# Patient Record
Sex: Male | Born: 1945
Health system: Southern US, Community
[De-identification: ages and names within clinical notes are randomized; demographics above are authoritative.]

## PROBLEM LIST (undated history)

## (undated) DIAGNOSIS — G4700 Insomnia, unspecified: Secondary | ICD-10-CM

## (undated) DIAGNOSIS — K5900 Constipation, unspecified: Secondary | ICD-10-CM

## (undated) DIAGNOSIS — G8929 Other chronic pain: Secondary | ICD-10-CM

## (undated) DIAGNOSIS — K649 Unspecified hemorrhoids: Secondary | ICD-10-CM

## (undated) DIAGNOSIS — N200 Calculus of kidney: Secondary | ICD-10-CM

## (undated) DIAGNOSIS — N471 Phimosis: Secondary | ICD-10-CM

## (undated) DIAGNOSIS — M549 Dorsalgia, unspecified: Secondary | ICD-10-CM

## (undated) DIAGNOSIS — H9190 Unspecified hearing loss, unspecified ear: Secondary | ICD-10-CM

## (undated) DIAGNOSIS — N281 Cyst of kidney, acquired: Secondary | ICD-10-CM

## (undated) DIAGNOSIS — E785 Hyperlipidemia, unspecified: Secondary | ICD-10-CM

## (undated) DIAGNOSIS — L219 Seborrheic dermatitis, unspecified: Secondary | ICD-10-CM

## (undated) DIAGNOSIS — D649 Anemia, unspecified: Secondary | ICD-10-CM

## (undated) DIAGNOSIS — K219 Gastro-esophageal reflux disease without esophagitis: Secondary | ICD-10-CM

## (undated) DIAGNOSIS — M810 Age-related osteoporosis without current pathological fracture: Secondary | ICD-10-CM

## (undated) DIAGNOSIS — N400 Enlarged prostate without lower urinary tract symptoms: Secondary | ICD-10-CM

## (undated) HISTORY — DX: Gastro-esophageal reflux disease without esophagitis: K21.9

## (undated) HISTORY — DX: Unspecified hemorrhoids: K64.9

## (undated) HISTORY — DX: Constipation, unspecified: K59.00

## (undated) HISTORY — DX: Unspecified hearing loss, unspecified ear: H91.90

## (undated) HISTORY — DX: Dorsalgia, unspecified: M54.9

## (undated) HISTORY — PX: OTHER SURGICAL HISTORY: SHX169

## (undated) HISTORY — DX: Benign prostatic hyperplasia without lower urinary tract symptoms: N40.0

## (undated) HISTORY — DX: Cyst of kidney, acquired: N28.1

## (undated) HISTORY — DX: Insomnia, unspecified: G47.00

## (undated) HISTORY — DX: Calculus of kidney: N20.0

## (undated) HISTORY — DX: Phimosis: N47.1

## (undated) HISTORY — DX: Hyperlipidemia, unspecified: E78.5

## (undated) HISTORY — DX: Age-related osteoporosis without current pathological fracture: M81.0

## (undated) HISTORY — DX: Anemia, unspecified: D64.9

## (undated) HISTORY — DX: Other chronic pain: G89.29

## (undated) HISTORY — DX: Seborrheic dermatitis, unspecified: L21.9

---

## 2004-09-05 ENCOUNTER — Ambulatory Visit: Payer: Self-pay | Admitting: Family Medicine

## 2005-03-10 ENCOUNTER — Emergency Department: Payer: Self-pay | Admitting: Emergency Medicine

## 2005-03-10 ENCOUNTER — Other Ambulatory Visit: Payer: Self-pay

## 2006-03-12 ENCOUNTER — Ambulatory Visit: Payer: Self-pay | Admitting: Family Medicine

## 2006-04-18 ENCOUNTER — Ambulatory Visit: Payer: Self-pay | Admitting: Gastroenterology

## 2006-05-10 ENCOUNTER — Emergency Department: Payer: Self-pay | Admitting: Emergency Medicine

## 2007-12-24 ENCOUNTER — Ambulatory Visit: Payer: Self-pay | Admitting: Family Medicine

## 2008-01-16 ENCOUNTER — Ambulatory Visit: Payer: Self-pay | Admitting: Family Medicine

## 2008-03-04 ENCOUNTER — Ambulatory Visit: Payer: Self-pay | Admitting: Anesthesiology

## 2008-03-24 ENCOUNTER — Ambulatory Visit: Payer: Self-pay | Admitting: Anesthesiology

## 2008-04-06 ENCOUNTER — Ambulatory Visit: Payer: Self-pay | Admitting: Anesthesiology

## 2008-05-26 ENCOUNTER — Ambulatory Visit: Payer: Self-pay | Admitting: Anesthesiology

## 2008-07-09 ENCOUNTER — Ambulatory Visit: Payer: Self-pay | Admitting: Anesthesiology

## 2008-09-15 ENCOUNTER — Ambulatory Visit: Payer: Self-pay | Admitting: Anesthesiology

## 2008-10-25 ENCOUNTER — Ambulatory Visit: Payer: Self-pay | Admitting: Anesthesiology

## 2008-11-30 ENCOUNTER — Encounter: Payer: Self-pay | Admitting: Anesthesiology

## 2008-12-21 ENCOUNTER — Ambulatory Visit: Payer: Self-pay | Admitting: Anesthesiology

## 2009-02-10 ENCOUNTER — Emergency Department: Payer: Self-pay | Admitting: Emergency Medicine

## 2009-05-03 ENCOUNTER — Ambulatory Visit: Payer: Self-pay | Admitting: Anesthesiology

## 2009-06-10 ENCOUNTER — Emergency Department: Payer: Self-pay | Admitting: Emergency Medicine

## 2009-10-05 ENCOUNTER — Emergency Department: Payer: Self-pay | Admitting: Emergency Medicine

## 2010-11-26 ENCOUNTER — Emergency Department: Payer: Self-pay | Admitting: Emergency Medicine

## 2011-07-03 ENCOUNTER — Emergency Department: Payer: Self-pay | Admitting: Emergency Medicine

## 2011-11-09 DIAGNOSIS — H6120 Impacted cerumen, unspecified ear: Secondary | ICD-10-CM | POA: Diagnosis not present

## 2012-01-02 ENCOUNTER — Emergency Department: Payer: Self-pay | Admitting: Emergency Medicine

## 2012-01-02 DIAGNOSIS — R031 Nonspecific low blood-pressure reading: Secondary | ICD-10-CM | POA: Diagnosis not present

## 2012-01-02 DIAGNOSIS — G20 Parkinson's disease: Secondary | ICD-10-CM | POA: Diagnosis not present

## 2012-01-02 DIAGNOSIS — G20A1 Parkinson's disease without dyskinesia, without mention of fluctuations: Secondary | ICD-10-CM | POA: Diagnosis not present

## 2012-01-02 DIAGNOSIS — Z0000 Encounter for general adult medical examination without abnormal findings: Secondary | ICD-10-CM | POA: Diagnosis not present

## 2012-01-02 DIAGNOSIS — Z79899 Other long term (current) drug therapy: Secondary | ICD-10-CM | POA: Diagnosis not present

## 2012-01-02 DIAGNOSIS — K219 Gastro-esophageal reflux disease without esophagitis: Secondary | ICD-10-CM | POA: Diagnosis not present

## 2012-01-02 DIAGNOSIS — F411 Generalized anxiety disorder: Secondary | ICD-10-CM | POA: Diagnosis not present

## 2012-01-02 DIAGNOSIS — Z043 Encounter for examination and observation following other accident: Secondary | ICD-10-CM | POA: Diagnosis not present

## 2012-01-02 DIAGNOSIS — R569 Unspecified convulsions: Secondary | ICD-10-CM | POA: Diagnosis not present

## 2012-01-02 DIAGNOSIS — R6889 Other general symptoms and signs: Secondary | ICD-10-CM | POA: Diagnosis not present

## 2012-01-09 DIAGNOSIS — K219 Gastro-esophageal reflux disease without esophagitis: Secondary | ICD-10-CM | POA: Diagnosis not present

## 2012-01-09 DIAGNOSIS — K5900 Constipation, unspecified: Secondary | ICD-10-CM | POA: Diagnosis not present

## 2012-01-09 DIAGNOSIS — M549 Dorsalgia, unspecified: Secondary | ICD-10-CM | POA: Diagnosis not present

## 2012-01-09 DIAGNOSIS — E785 Hyperlipidemia, unspecified: Secondary | ICD-10-CM | POA: Diagnosis not present

## 2012-02-08 DIAGNOSIS — Z79899 Other long term (current) drug therapy: Secondary | ICD-10-CM | POA: Diagnosis not present

## 2012-02-08 DIAGNOSIS — Z862 Personal history of diseases of the blood and blood-forming organs and certain disorders involving the immune mechanism: Secondary | ICD-10-CM | POA: Diagnosis not present

## 2012-02-08 DIAGNOSIS — E785 Hyperlipidemia, unspecified: Secondary | ICD-10-CM | POA: Diagnosis not present

## 2012-04-07 DIAGNOSIS — Z0000 Encounter for general adult medical examination without abnormal findings: Secondary | ICD-10-CM | POA: Diagnosis not present

## 2012-04-07 DIAGNOSIS — Z7189 Other specified counseling: Secondary | ICD-10-CM | POA: Diagnosis not present

## 2012-04-07 DIAGNOSIS — Z1211 Encounter for screening for malignant neoplasm of colon: Secondary | ICD-10-CM | POA: Diagnosis not present

## 2012-04-07 DIAGNOSIS — Z125 Encounter for screening for malignant neoplasm of prostate: Secondary | ICD-10-CM | POA: Diagnosis not present

## 2012-04-15 DIAGNOSIS — H903 Sensorineural hearing loss, bilateral: Secondary | ICD-10-CM | POA: Diagnosis not present

## 2012-04-15 DIAGNOSIS — H6120 Impacted cerumen, unspecified ear: Secondary | ICD-10-CM | POA: Diagnosis not present

## 2012-04-15 DIAGNOSIS — H60509 Unspecified acute noninfective otitis externa, unspecified ear: Secondary | ICD-10-CM | POA: Diagnosis not present

## 2012-05-27 DIAGNOSIS — M549 Dorsalgia, unspecified: Secondary | ICD-10-CM | POA: Diagnosis not present

## 2012-05-27 DIAGNOSIS — K625 Hemorrhage of anus and rectum: Secondary | ICD-10-CM | POA: Diagnosis not present

## 2012-07-14 DIAGNOSIS — Z593 Problems related to living in residential institution: Secondary | ICD-10-CM | POA: Diagnosis not present

## 2012-07-14 DIAGNOSIS — E785 Hyperlipidemia, unspecified: Secondary | ICD-10-CM | POA: Diagnosis not present

## 2012-07-14 DIAGNOSIS — Z789 Other specified health status: Secondary | ICD-10-CM | POA: Diagnosis not present

## 2012-07-14 DIAGNOSIS — K219 Gastro-esophageal reflux disease without esophagitis: Secondary | ICD-10-CM | POA: Diagnosis not present

## 2012-07-14 DIAGNOSIS — M549 Dorsalgia, unspecified: Secondary | ICD-10-CM | POA: Diagnosis not present

## 2012-09-08 DIAGNOSIS — F70 Mild intellectual disabilities: Secondary | ICD-10-CM | POA: Diagnosis not present

## 2012-09-16 DIAGNOSIS — M549 Dorsalgia, unspecified: Secondary | ICD-10-CM | POA: Diagnosis not present

## 2012-09-16 DIAGNOSIS — Z9181 History of falling: Secondary | ICD-10-CM | POA: Diagnosis not present

## 2012-09-16 DIAGNOSIS — F919 Conduct disorder, unspecified: Secondary | ICD-10-CM | POA: Diagnosis not present

## 2012-09-16 DIAGNOSIS — Z789 Other specified health status: Secondary | ICD-10-CM | POA: Diagnosis not present

## 2012-09-16 DIAGNOSIS — Z593 Problems related to living in residential institution: Secondary | ICD-10-CM | POA: Diagnosis not present

## 2012-09-17 DIAGNOSIS — Z23 Encounter for immunization: Secondary | ICD-10-CM | POA: Diagnosis not present

## 2012-10-21 DIAGNOSIS — H903 Sensorineural hearing loss, bilateral: Secondary | ICD-10-CM | POA: Diagnosis not present

## 2012-10-21 DIAGNOSIS — H6120 Impacted cerumen, unspecified ear: Secondary | ICD-10-CM | POA: Diagnosis not present

## 2012-11-17 DIAGNOSIS — K219 Gastro-esophageal reflux disease without esophagitis: Secondary | ICD-10-CM | POA: Diagnosis not present

## 2012-11-17 DIAGNOSIS — G4700 Insomnia, unspecified: Secondary | ICD-10-CM | POA: Diagnosis not present

## 2012-11-17 DIAGNOSIS — E785 Hyperlipidemia, unspecified: Secondary | ICD-10-CM | POA: Diagnosis not present

## 2012-11-17 DIAGNOSIS — M549 Dorsalgia, unspecified: Secondary | ICD-10-CM | POA: Diagnosis not present

## 2013-02-18 DIAGNOSIS — F639 Impulse disorder, unspecified: Secondary | ICD-10-CM | POA: Diagnosis not present

## 2013-02-18 DIAGNOSIS — F411 Generalized anxiety disorder: Secondary | ICD-10-CM | POA: Diagnosis not present

## 2013-02-26 DIAGNOSIS — H04129 Dry eye syndrome of unspecified lacrimal gland: Secondary | ICD-10-CM | POA: Diagnosis not present

## 2013-02-26 DIAGNOSIS — H2510 Age-related nuclear cataract, unspecified eye: Secondary | ICD-10-CM | POA: Diagnosis not present

## 2013-03-10 DIAGNOSIS — F411 Generalized anxiety disorder: Secondary | ICD-10-CM | POA: Diagnosis not present

## 2013-03-19 DIAGNOSIS — Z9889 Other specified postprocedural states: Secondary | ICD-10-CM | POA: Diagnosis not present

## 2013-03-19 DIAGNOSIS — H6120 Impacted cerumen, unspecified ear: Secondary | ICD-10-CM | POA: Diagnosis not present

## 2013-03-19 DIAGNOSIS — M810 Age-related osteoporosis without current pathological fracture: Secondary | ICD-10-CM | POA: Diagnosis not present

## 2013-03-19 DIAGNOSIS — M549 Dorsalgia, unspecified: Secondary | ICD-10-CM | POA: Diagnosis not present

## 2013-03-24 DIAGNOSIS — F411 Generalized anxiety disorder: Secondary | ICD-10-CM | POA: Diagnosis not present

## 2013-04-09 DIAGNOSIS — Z1331 Encounter for screening for depression: Secondary | ICD-10-CM | POA: Diagnosis not present

## 2013-04-09 DIAGNOSIS — Z1159 Encounter for screening for other viral diseases: Secondary | ICD-10-CM | POA: Diagnosis not present

## 2013-04-09 DIAGNOSIS — Z125 Encounter for screening for malignant neoplasm of prostate: Secondary | ICD-10-CM | POA: Diagnosis not present

## 2013-04-09 DIAGNOSIS — Z0000 Encounter for general adult medical examination without abnormal findings: Secondary | ICD-10-CM | POA: Diagnosis not present

## 2013-04-09 DIAGNOSIS — Z1211 Encounter for screening for malignant neoplasm of colon: Secondary | ICD-10-CM | POA: Diagnosis not present

## 2013-04-21 DIAGNOSIS — H903 Sensorineural hearing loss, bilateral: Secondary | ICD-10-CM | POA: Diagnosis not present

## 2013-04-21 DIAGNOSIS — H6120 Impacted cerumen, unspecified ear: Secondary | ICD-10-CM | POA: Diagnosis not present

## 2013-05-11 DIAGNOSIS — F79 Unspecified intellectual disabilities: Secondary | ICD-10-CM | POA: Diagnosis not present

## 2013-05-11 DIAGNOSIS — K219 Gastro-esophageal reflux disease without esophagitis: Secondary | ICD-10-CM | POA: Diagnosis not present

## 2013-05-14 DIAGNOSIS — G8929 Other chronic pain: Secondary | ICD-10-CM | POA: Diagnosis not present

## 2013-05-14 DIAGNOSIS — E7800 Pure hypercholesterolemia, unspecified: Secondary | ICD-10-CM | POA: Diagnosis not present

## 2013-05-14 DIAGNOSIS — Z79899 Other long term (current) drug therapy: Secondary | ICD-10-CM | POA: Diagnosis not present

## 2013-05-14 DIAGNOSIS — K029 Dental caries, unspecified: Secondary | ICD-10-CM | POA: Diagnosis not present

## 2013-05-14 DIAGNOSIS — F79 Unspecified intellectual disabilities: Secondary | ICD-10-CM | POA: Diagnosis not present

## 2013-05-14 DIAGNOSIS — K219 Gastro-esophageal reflux disease without esophagitis: Secondary | ICD-10-CM | POA: Diagnosis not present

## 2013-05-14 DIAGNOSIS — Z7982 Long term (current) use of aspirin: Secondary | ICD-10-CM | POA: Diagnosis not present

## 2013-06-04 DIAGNOSIS — H903 Sensorineural hearing loss, bilateral: Secondary | ICD-10-CM | POA: Diagnosis not present

## 2013-06-04 DIAGNOSIS — H6120 Impacted cerumen, unspecified ear: Secondary | ICD-10-CM | POA: Diagnosis not present

## 2013-06-04 DIAGNOSIS — H60509 Unspecified acute noninfective otitis externa, unspecified ear: Secondary | ICD-10-CM | POA: Diagnosis not present

## 2013-06-05 DIAGNOSIS — Z79899 Other long term (current) drug therapy: Secondary | ICD-10-CM | POA: Diagnosis not present

## 2013-06-05 DIAGNOSIS — E785 Hyperlipidemia, unspecified: Secondary | ICD-10-CM | POA: Diagnosis not present

## 2013-06-05 DIAGNOSIS — Z125 Encounter for screening for malignant neoplasm of prostate: Secondary | ICD-10-CM | POA: Diagnosis not present

## 2013-06-05 DIAGNOSIS — Z1159 Encounter for screening for other viral diseases: Secondary | ICD-10-CM | POA: Diagnosis not present

## 2013-06-09 DIAGNOSIS — F411 Generalized anxiety disorder: Secondary | ICD-10-CM | POA: Diagnosis not present

## 2013-06-16 DIAGNOSIS — K140 Glossitis: Secondary | ICD-10-CM | POA: Diagnosis not present

## 2013-06-20 ENCOUNTER — Emergency Department: Payer: Self-pay | Admitting: Unknown Physician Specialty

## 2013-06-20 DIAGNOSIS — F0280 Dementia in other diseases classified elsewhere without behavioral disturbance: Secondary | ICD-10-CM | POA: Diagnosis not present

## 2013-06-20 DIAGNOSIS — Z79899 Other long term (current) drug therapy: Secondary | ICD-10-CM | POA: Diagnosis not present

## 2013-06-20 DIAGNOSIS — N390 Urinary tract infection, site not specified: Secondary | ICD-10-CM | POA: Diagnosis not present

## 2013-06-20 DIAGNOSIS — M1990 Unspecified osteoarthritis, unspecified site: Secondary | ICD-10-CM | POA: Diagnosis not present

## 2013-06-20 DIAGNOSIS — R079 Chest pain, unspecified: Secondary | ICD-10-CM | POA: Diagnosis not present

## 2013-06-20 DIAGNOSIS — R0789 Other chest pain: Secondary | ICD-10-CM | POA: Diagnosis not present

## 2013-06-20 LAB — URINALYSIS, COMPLETE
Glucose,UR: NEGATIVE mg/dL (ref 0–75)
Leukocyte Esterase: NEGATIVE
Nitrite: NEGATIVE
Ph: 5 (ref 4.5–8.0)
Protein: >=500
RBC,UR: 107 /HPF (ref 0–5)
Specific Gravity: 1.039 (ref 1.003–1.030)
Squamous Epithelial: 1
WBC UR: 2 /[HPF] (ref 0–5)

## 2013-06-20 LAB — BASIC METABOLIC PANEL
Anion Gap: 6 — ABNORMAL LOW (ref 7–16)
BUN: 28 mg/dL — ABNORMAL HIGH (ref 7–18)
Calcium, Total: 9 mg/dL (ref 8.5–10.1)
Chloride: 103 mmol/L (ref 98–107)
Co2: 27 mmol/L (ref 21–32)
Creatinine: 1.23 mg/dL (ref 0.60–1.30)
EGFR (African American): >60
EGFR (Non-African Amer.): >60
Glucose: 106 mg/dL — ABNORMAL HIGH (ref 65–99)
Osmolality: 278 (ref 275–301)
Potassium: 3.7 mmol/L (ref 3.5–5.1)
Sodium: 136 mmol/L (ref 136–145)

## 2013-06-20 LAB — HEPATIC FUNCTION PANEL A (ARMC)
Albumin: 3.9 g/dL (ref 3.4–5.0)
Alkaline Phosphatase: 85 U/L (ref 50–136)
Bilirubin, Direct: 0.1 mg/dL (ref 0.00–0.20)
Bilirubin,Total: 0.5 mg/dL (ref 0.2–1.0)
SGOT(AST): 38 U/L — ABNORMAL HIGH (ref 15–37)
SGPT (ALT): 32 U/L (ref 12–78)
Total Protein: 8 g/dL (ref 6.4–8.2)

## 2013-06-20 LAB — CBC
HCT: 37 % — ABNORMAL LOW (ref 40.0–52.0)
HGB: 12.6 g/dL — ABNORMAL LOW (ref 13.0–18.0)
MCH: 31.5 pg (ref 26.0–34.0)
MCHC: 34.2 g/dL (ref 32.0–36.0)
MCV: 92 fL (ref 80–100)
Platelet: 409 10*3/uL (ref 150–440)
RBC: 4.01 10*6/uL — ABNORMAL LOW (ref 4.40–5.90)
RDW: 12.6 % (ref 11.5–14.5)
WBC: 13.3 10*3/uL — ABNORMAL HIGH (ref 3.8–10.6)

## 2013-06-20 LAB — TROPONIN I: Troponin-I: <0.02 ng/mL

## 2013-06-22 LAB — URINE CULTURE

## 2013-06-23 ENCOUNTER — Emergency Department: Payer: Self-pay | Admitting: Emergency Medicine

## 2013-06-23 DIAGNOSIS — R079 Chest pain, unspecified: Secondary | ICD-10-CM | POA: Diagnosis not present

## 2013-06-23 DIAGNOSIS — N281 Cyst of kidney, acquired: Secondary | ICD-10-CM | POA: Diagnosis not present

## 2013-06-23 DIAGNOSIS — R569 Unspecified convulsions: Secondary | ICD-10-CM | POA: Diagnosis not present

## 2013-06-23 DIAGNOSIS — J209 Acute bronchitis, unspecified: Secondary | ICD-10-CM | POA: Diagnosis not present

## 2013-06-23 DIAGNOSIS — J40 Bronchitis, not specified as acute or chronic: Secondary | ICD-10-CM | POA: Diagnosis not present

## 2013-06-23 DIAGNOSIS — N200 Calculus of kidney: Secondary | ICD-10-CM | POA: Diagnosis not present

## 2013-06-23 DIAGNOSIS — Z79899 Other long term (current) drug therapy: Secondary | ICD-10-CM | POA: Diagnosis not present

## 2013-06-23 DIAGNOSIS — R0789 Other chest pain: Secondary | ICD-10-CM | POA: Diagnosis not present

## 2013-06-23 LAB — COMPREHENSIVE METABOLIC PANEL
Albumin: 3.4 g/dL (ref 3.4–5.0)
Alkaline Phosphatase: 70 U/L (ref 50–136)
Anion Gap: 6 — ABNORMAL LOW (ref 7–16)
BUN: 20 mg/dL — ABNORMAL HIGH (ref 7–18)
Bilirubin,Total: 0.4 mg/dL (ref 0.2–1.0)
Calcium, Total: 8.8 mg/dL (ref 8.5–10.1)
Chloride: 101 mmol/L (ref 98–107)
Co2: 27 mmol/L (ref 21–32)
Creatinine: 0.94 mg/dL (ref 0.60–1.30)
EGFR (African American): >60
EGFR (Non-African Amer.): >60
Glucose: 83 mg/dL (ref 65–99)
Osmolality: 270 (ref 275–301)
Potassium: 3.7 mmol/L (ref 3.5–5.1)
SGOT(AST): 26 U/L (ref 15–37)
SGPT (ALT): 29 U/L (ref 12–78)
Sodium: 134 mmol/L — ABNORMAL LOW (ref 136–145)
Total Protein: 7 g/dL (ref 6.4–8.2)

## 2013-06-23 LAB — CBC WITH DIFFERENTIAL/PLATELET
Basophil #: 0.1 10*3/uL (ref 0.0–0.1)
Basophil %: 1 %
Eosinophil #: 0.2 10*3/uL (ref 0.0–0.7)
Eosinophil %: 2 %
HCT: 34.8 % — ABNORMAL LOW (ref 40.0–52.0)
HGB: 12.1 g/dL — ABNORMAL LOW (ref 13.0–18.0)
Lymphocyte %: 24.2 %
Lymphs Abs: 2.1 10*3/uL (ref 1.0–3.6)
MCH: 31.9 pg (ref 26.0–34.0)
MCHC: 34.6 g/dL (ref 32.0–36.0)
MCV: 92 fL (ref 80–100)
Monocyte #: 0.7 x10 3/mm (ref 0.2–1.0)
Monocyte %: 8.6 %
Neutrophil #: 5.6 10*3/uL (ref 1.4–6.5)
Neutrophil %: 64.2 %
Platelet: 395 10*3/uL (ref 150–440)
RBC: 3.78 10*6/uL — ABNORMAL LOW (ref 4.40–5.90)
RDW: 12.7 % (ref 11.5–14.5)
WBC: 8.7 10*3/uL (ref 3.8–10.6)

## 2013-06-23 LAB — TROPONIN I: Troponin-I: <0.02 ng/mL

## 2013-06-25 LAB — CULTURE, BLOOD (SINGLE)

## 2013-07-01 DIAGNOSIS — D649 Anemia, unspecified: Secondary | ICD-10-CM | POA: Diagnosis not present

## 2013-07-01 DIAGNOSIS — J40 Bronchitis, not specified as acute or chronic: Secondary | ICD-10-CM | POA: Diagnosis not present

## 2013-07-01 DIAGNOSIS — J029 Acute pharyngitis, unspecified: Secondary | ICD-10-CM | POA: Diagnosis not present

## 2013-07-01 DIAGNOSIS — R1310 Dysphagia, unspecified: Secondary | ICD-10-CM | POA: Diagnosis not present

## 2013-07-02 DIAGNOSIS — K219 Gastro-esophageal reflux disease without esophagitis: Secondary | ICD-10-CM | POA: Diagnosis not present

## 2013-07-02 DIAGNOSIS — R1310 Dysphagia, unspecified: Secondary | ICD-10-CM | POA: Diagnosis not present

## 2013-07-02 DIAGNOSIS — R070 Pain in throat: Secondary | ICD-10-CM | POA: Diagnosis not present

## 2013-07-09 ENCOUNTER — Ambulatory Visit: Payer: Self-pay | Admitting: Otolaryngology

## 2013-07-09 DIAGNOSIS — K219 Gastro-esophageal reflux disease without esophagitis: Secondary | ICD-10-CM | POA: Diagnosis not present

## 2013-07-09 DIAGNOSIS — R1310 Dysphagia, unspecified: Secondary | ICD-10-CM | POA: Diagnosis not present

## 2013-07-09 DIAGNOSIS — K2289 Other specified disease of esophagus: Secondary | ICD-10-CM | POA: Diagnosis not present

## 2013-07-09 DIAGNOSIS — R070 Pain in throat: Secondary | ICD-10-CM | POA: Diagnosis not present

## 2013-07-31 DIAGNOSIS — R634 Abnormal weight loss: Secondary | ICD-10-CM | POA: Diagnosis not present

## 2013-07-31 DIAGNOSIS — R1310 Dysphagia, unspecified: Secondary | ICD-10-CM | POA: Diagnosis not present

## 2013-08-05 DIAGNOSIS — R51 Headache: Secondary | ICD-10-CM | POA: Diagnosis not present

## 2013-08-10 DIAGNOSIS — R1310 Dysphagia, unspecified: Secondary | ICD-10-CM | POA: Diagnosis not present

## 2013-08-10 DIAGNOSIS — K1120 Sialoadenitis, unspecified: Secondary | ICD-10-CM | POA: Diagnosis not present

## 2013-08-10 DIAGNOSIS — G8929 Other chronic pain: Secondary | ICD-10-CM | POA: Diagnosis not present

## 2013-08-10 DIAGNOSIS — M549 Dorsalgia, unspecified: Secondary | ICD-10-CM | POA: Diagnosis not present

## 2013-08-10 DIAGNOSIS — Z23 Encounter for immunization: Secondary | ICD-10-CM | POA: Diagnosis not present

## 2013-08-17 DIAGNOSIS — N471 Phimosis: Secondary | ICD-10-CM | POA: Diagnosis not present

## 2013-08-17 DIAGNOSIS — N200 Calculus of kidney: Secondary | ICD-10-CM | POA: Diagnosis not present

## 2013-08-17 DIAGNOSIS — Q619 Cystic kidney disease, unspecified: Secondary | ICD-10-CM | POA: Diagnosis not present

## 2013-08-17 DIAGNOSIS — N478 Other disorders of prepuce: Secondary | ICD-10-CM | POA: Diagnosis not present

## 2013-08-27 ENCOUNTER — Ambulatory Visit: Payer: Self-pay | Admitting: Gastroenterology

## 2013-08-27 DIAGNOSIS — K228 Other specified diseases of esophagus: Secondary | ICD-10-CM | POA: Diagnosis not present

## 2013-08-27 DIAGNOSIS — K2289 Other specified disease of esophagus: Secondary | ICD-10-CM | POA: Diagnosis not present

## 2013-08-27 DIAGNOSIS — K648 Other hemorrhoids: Secondary | ICD-10-CM | POA: Diagnosis not present

## 2013-08-27 DIAGNOSIS — Z79899 Other long term (current) drug therapy: Secondary | ICD-10-CM | POA: Diagnosis not present

## 2013-08-27 DIAGNOSIS — K2970 Gastritis, unspecified, without bleeding: Secondary | ICD-10-CM | POA: Diagnosis not present

## 2013-08-27 DIAGNOSIS — R1310 Dysphagia, unspecified: Secondary | ICD-10-CM | POA: Diagnosis not present

## 2013-08-27 DIAGNOSIS — K219 Gastro-esophageal reflux disease without esophagitis: Secondary | ICD-10-CM | POA: Diagnosis not present

## 2013-08-27 DIAGNOSIS — R634 Abnormal weight loss: Secondary | ICD-10-CM | POA: Diagnosis not present

## 2013-08-27 DIAGNOSIS — Z7982 Long term (current) use of aspirin: Secondary | ICD-10-CM | POA: Diagnosis not present

## 2013-08-27 DIAGNOSIS — F17200 Nicotine dependence, unspecified, uncomplicated: Secondary | ICD-10-CM | POA: Diagnosis not present

## 2013-09-09 DIAGNOSIS — R634 Abnormal weight loss: Secondary | ICD-10-CM | POA: Diagnosis not present

## 2013-09-17 DIAGNOSIS — N478 Other disorders of prepuce: Secondary | ICD-10-CM | POA: Diagnosis not present

## 2013-09-17 DIAGNOSIS — N471 Phimosis: Secondary | ICD-10-CM | POA: Diagnosis not present

## 2013-09-17 DIAGNOSIS — Q619 Cystic kidney disease, unspecified: Secondary | ICD-10-CM | POA: Diagnosis not present

## 2013-09-17 DIAGNOSIS — N200 Calculus of kidney: Secondary | ICD-10-CM | POA: Diagnosis not present

## 2013-09-23 ENCOUNTER — Ambulatory Visit: Payer: Self-pay | Admitting: Urology

## 2013-10-16 ENCOUNTER — Ambulatory Visit: Payer: Self-pay | Admitting: Urology

## 2013-10-16 DIAGNOSIS — R0602 Shortness of breath: Secondary | ICD-10-CM | POA: Diagnosis not present

## 2013-10-16 DIAGNOSIS — N478 Other disorders of prepuce: Secondary | ICD-10-CM | POA: Diagnosis not present

## 2013-10-16 DIAGNOSIS — K219 Gastro-esophageal reflux disease without esophagitis: Secondary | ICD-10-CM | POA: Diagnosis not present

## 2013-10-16 DIAGNOSIS — Z79899 Other long term (current) drug therapy: Secondary | ICD-10-CM | POA: Diagnosis not present

## 2013-10-16 DIAGNOSIS — Z7982 Long term (current) use of aspirin: Secondary | ICD-10-CM | POA: Diagnosis not present

## 2013-10-16 DIAGNOSIS — M129 Arthropathy, unspecified: Secondary | ICD-10-CM | POA: Diagnosis not present

## 2013-10-16 DIAGNOSIS — M549 Dorsalgia, unspecified: Secondary | ICD-10-CM | POA: Diagnosis not present

## 2013-10-16 DIAGNOSIS — H9190 Unspecified hearing loss, unspecified ear: Secondary | ICD-10-CM | POA: Diagnosis not present

## 2013-10-16 DIAGNOSIS — G809 Cerebral palsy, unspecified: Secondary | ICD-10-CM | POA: Diagnosis not present

## 2013-10-16 DIAGNOSIS — N471 Phimosis: Secondary | ICD-10-CM | POA: Diagnosis not present

## 2013-10-20 DIAGNOSIS — H903 Sensorineural hearing loss, bilateral: Secondary | ICD-10-CM | POA: Diagnosis not present

## 2013-10-20 DIAGNOSIS — H6120 Impacted cerumen, unspecified ear: Secondary | ICD-10-CM | POA: Diagnosis not present

## 2013-12-03 DIAGNOSIS — Z79899 Other long term (current) drug therapy: Secondary | ICD-10-CM | POA: Diagnosis not present

## 2013-12-08 DIAGNOSIS — F411 Generalized anxiety disorder: Secondary | ICD-10-CM | POA: Diagnosis not present

## 2013-12-14 DIAGNOSIS — M549 Dorsalgia, unspecified: Secondary | ICD-10-CM | POA: Diagnosis not present

## 2013-12-14 DIAGNOSIS — E785 Hyperlipidemia, unspecified: Secondary | ICD-10-CM | POA: Diagnosis not present

## 2013-12-14 DIAGNOSIS — G8929 Other chronic pain: Secondary | ICD-10-CM | POA: Diagnosis not present

## 2013-12-14 DIAGNOSIS — G4700 Insomnia, unspecified: Secondary | ICD-10-CM | POA: Diagnosis not present

## 2014-01-07 DIAGNOSIS — F919 Conduct disorder, unspecified: Secondary | ICD-10-CM | POA: Diagnosis not present

## 2014-01-14 DIAGNOSIS — E441 Mild protein-calorie malnutrition: Secondary | ICD-10-CM | POA: Diagnosis not present

## 2014-01-14 DIAGNOSIS — D509 Iron deficiency anemia, unspecified: Secondary | ICD-10-CM | POA: Diagnosis not present

## 2014-01-14 DIAGNOSIS — M549 Dorsalgia, unspecified: Secondary | ICD-10-CM | POA: Diagnosis not present

## 2014-01-14 DIAGNOSIS — F919 Conduct disorder, unspecified: Secondary | ICD-10-CM | POA: Diagnosis not present

## 2014-02-19 DIAGNOSIS — IMO0002 Reserved for concepts with insufficient information to code with codable children: Secondary | ICD-10-CM | POA: Diagnosis not present

## 2014-02-19 DIAGNOSIS — H9209 Otalgia, unspecified ear: Secondary | ICD-10-CM | POA: Diagnosis not present

## 2014-03-09 DIAGNOSIS — F411 Generalized anxiety disorder: Secondary | ICD-10-CM | POA: Diagnosis not present

## 2014-03-11 DIAGNOSIS — H04129 Dry eye syndrome of unspecified lacrimal gland: Secondary | ICD-10-CM | POA: Diagnosis not present

## 2014-03-11 DIAGNOSIS — H16109 Unspecified superficial keratitis, unspecified eye: Secondary | ICD-10-CM | POA: Diagnosis not present

## 2014-03-11 DIAGNOSIS — H2510 Age-related nuclear cataract, unspecified eye: Secondary | ICD-10-CM | POA: Diagnosis not present

## 2014-03-11 DIAGNOSIS — H5200 Hypermetropia, unspecified eye: Secondary | ICD-10-CM | POA: Diagnosis not present

## 2014-04-12 DIAGNOSIS — M549 Dorsalgia, unspecified: Secondary | ICD-10-CM | POA: Diagnosis not present

## 2014-04-12 DIAGNOSIS — D509 Iron deficiency anemia, unspecified: Secondary | ICD-10-CM | POA: Diagnosis not present

## 2014-04-12 DIAGNOSIS — K5900 Constipation, unspecified: Secondary | ICD-10-CM | POA: Diagnosis not present

## 2014-04-12 DIAGNOSIS — Z9181 History of falling: Secondary | ICD-10-CM | POA: Diagnosis not present

## 2014-04-12 DIAGNOSIS — Z1331 Encounter for screening for depression: Secondary | ICD-10-CM | POA: Diagnosis not present

## 2014-04-12 DIAGNOSIS — Z0000 Encounter for general adult medical examination without abnormal findings: Secondary | ICD-10-CM | POA: Diagnosis not present

## 2014-04-12 DIAGNOSIS — E785 Hyperlipidemia, unspecified: Secondary | ICD-10-CM | POA: Diagnosis not present

## 2014-04-12 DIAGNOSIS — Z125 Encounter for screening for malignant neoplasm of prostate: Secondary | ICD-10-CM | POA: Diagnosis not present

## 2014-04-12 DIAGNOSIS — E441 Mild protein-calorie malnutrition: Secondary | ICD-10-CM | POA: Diagnosis not present

## 2014-04-24 DIAGNOSIS — H6120 Impacted cerumen, unspecified ear: Secondary | ICD-10-CM | POA: Diagnosis not present

## 2014-04-27 DIAGNOSIS — H6120 Impacted cerumen, unspecified ear: Secondary | ICD-10-CM | POA: Diagnosis not present

## 2014-04-27 DIAGNOSIS — H903 Sensorineural hearing loss, bilateral: Secondary | ICD-10-CM | POA: Diagnosis not present

## 2014-05-11 DIAGNOSIS — N478 Other disorders of prepuce: Secondary | ICD-10-CM | POA: Diagnosis not present

## 2014-05-11 DIAGNOSIS — N400 Enlarged prostate without lower urinary tract symptoms: Secondary | ICD-10-CM | POA: Diagnosis not present

## 2014-05-11 DIAGNOSIS — N471 Phimosis: Secondary | ICD-10-CM | POA: Diagnosis not present

## 2014-05-25 DIAGNOSIS — N400 Enlarged prostate without lower urinary tract symptoms: Secondary | ICD-10-CM | POA: Diagnosis not present

## 2014-06-07 DIAGNOSIS — Z79899 Other long term (current) drug therapy: Secondary | ICD-10-CM | POA: Diagnosis not present

## 2014-06-08 DIAGNOSIS — F411 Generalized anxiety disorder: Secondary | ICD-10-CM | POA: Diagnosis not present

## 2014-06-17 DIAGNOSIS — M79609 Pain in unspecified limb: Secondary | ICD-10-CM | POA: Diagnosis not present

## 2014-06-17 DIAGNOSIS — M549 Dorsalgia, unspecified: Secondary | ICD-10-CM | POA: Diagnosis not present

## 2014-06-17 DIAGNOSIS — H9190 Unspecified hearing loss, unspecified ear: Secondary | ICD-10-CM | POA: Diagnosis not present

## 2014-06-17 DIAGNOSIS — F919 Conduct disorder, unspecified: Secondary | ICD-10-CM | POA: Diagnosis not present

## 2014-10-18 DIAGNOSIS — K5900 Constipation, unspecified: Secondary | ICD-10-CM | POA: Diagnosis not present

## 2014-10-18 DIAGNOSIS — Z23 Encounter for immunization: Secondary | ICD-10-CM | POA: Diagnosis not present

## 2014-10-18 DIAGNOSIS — Z593 Problems related to living in residential institution: Secondary | ICD-10-CM | POA: Diagnosis not present

## 2014-10-18 DIAGNOSIS — F79 Unspecified intellectual disabilities: Secondary | ICD-10-CM | POA: Diagnosis not present

## 2014-10-18 DIAGNOSIS — K219 Gastro-esophageal reflux disease without esophagitis: Secondary | ICD-10-CM | POA: Diagnosis not present

## 2014-10-18 DIAGNOSIS — G8929 Other chronic pain: Secondary | ICD-10-CM | POA: Diagnosis not present

## 2014-10-18 DIAGNOSIS — M549 Dorsalgia, unspecified: Secondary | ICD-10-CM | POA: Diagnosis not present

## 2014-11-16 DIAGNOSIS — H6123 Impacted cerumen, bilateral: Secondary | ICD-10-CM | POA: Diagnosis not present

## 2014-11-16 DIAGNOSIS — R070 Pain in throat: Secondary | ICD-10-CM | POA: Diagnosis not present

## 2014-12-07 DIAGNOSIS — F419 Anxiety disorder, unspecified: Secondary | ICD-10-CM | POA: Diagnosis not present

## 2015-02-21 DIAGNOSIS — M549 Dorsalgia, unspecified: Secondary | ICD-10-CM | POA: Diagnosis not present

## 2015-02-21 DIAGNOSIS — F79 Unspecified intellectual disabilities: Secondary | ICD-10-CM | POA: Diagnosis not present

## 2015-02-21 DIAGNOSIS — M25532 Pain in left wrist: Secondary | ICD-10-CM | POA: Diagnosis not present

## 2015-02-21 DIAGNOSIS — F5104 Psychophysiologic insomnia: Secondary | ICD-10-CM | POA: Diagnosis not present

## 2015-02-21 DIAGNOSIS — Z8639 Personal history of other endocrine, nutritional and metabolic disease: Secondary | ICD-10-CM | POA: Diagnosis not present

## 2015-02-21 DIAGNOSIS — K219 Gastro-esophageal reflux disease without esophagitis: Secondary | ICD-10-CM | POA: Diagnosis not present

## 2015-02-21 DIAGNOSIS — G8929 Other chronic pain: Secondary | ICD-10-CM | POA: Diagnosis not present

## 2015-02-21 DIAGNOSIS — Z974 Presence of external hearing-aid: Secondary | ICD-10-CM | POA: Diagnosis not present

## 2015-02-25 NOTE — Op Note (Signed)
PATIENT NAME:  Aaron Foster, Aaron Foster MR#:  [id number] DATE OF BIRTH:  06-Jul-1946  DATE OF PROCEDURE:  10/16/2013  PREOPERATIVE DIAGNOSIS: Phimosis.   POSTOPERATIVE DIAGNOSIS: Phimosis.   PROCEDURE: Adult circumcision.   ANESTHESIA: General with local.   DESCRIPTION OF PROCEDURE: The patient was sterilely prepped and draped in supine position. This unfortunate cerebral palsy patient has severe phimosis. He has difficulty retracting his foreskin. Under general anesthetic today, I anesthetized the base of the penis, did a dorsal and proximal block dorsally and ventrally with Marcaine and Sensorcaine, then do a block where the incision is going to be made.   The incision is marked preincision with a marking pencil as to the natural line. Then the incision is done circumferentially through the skin under tension. The redundant foreskin was cut away. The lateral and fascial bands are incised until we have a good long section of submucosal fascia  removed and then sharply excised. Then the submucosa was reanastomosed to the remaining penile skin with interrupted 2-0 chromic suture in a circumferential manner. [name] wrapped with Vaseline gauze underneath and then wrapped tightly with 1-inch Kling. [name] sent to recovery in satisfactory condition. Bleeding is controlled with electrocautery. There was minimal bleeding through the case.   ____________________________ Caralyn Guileichard D. Edwyna ShellHart, DO rdh:np D: 10/16/2013 18:23:18 ET T: 10/16/2013 19:25:03 ET JOB#: [id number]  cc: Caralyn Guileichard D. Edwyna ShellHart, DO, <Dictator> RICHARD D HART DO ELECTRONICALLY SIGNED 10/23/2013 15:49

## 2015-03-03 DIAGNOSIS — H04121 Dry eye syndrome of right lacrimal gland: Secondary | ICD-10-CM | POA: Diagnosis not present

## 2015-03-03 DIAGNOSIS — H04122 Dry eye syndrome of left lacrimal gland: Secondary | ICD-10-CM | POA: Diagnosis not present

## 2015-03-03 DIAGNOSIS — H2513 Age-related nuclear cataract, bilateral: Secondary | ICD-10-CM | POA: Diagnosis not present

## 2015-03-21 ENCOUNTER — Emergency Department
Admission: EM | Admit: 2015-03-21 | Discharge: 2015-03-21 | Disposition: A | Discharge status: Home / Self Care | Payer: Medicare Other | Attending: Emergency Medicine | Admitting: Emergency Medicine

## 2015-03-21 ENCOUNTER — Emergency Department: Payer: Medicare Other

## 2015-03-21 DIAGNOSIS — W01198A Fall on same level from slipping, tripping and stumbling with subsequent striking against other object, initial encounter: Secondary | ICD-10-CM | POA: Insufficient documentation

## 2015-03-21 DIAGNOSIS — S0083XA Contusion of other part of head, initial encounter: Secondary | ICD-10-CM | POA: Insufficient documentation

## 2015-03-21 DIAGNOSIS — S7012XA Contusion of left thigh, initial encounter: Secondary | ICD-10-CM | POA: Insufficient documentation

## 2015-03-21 DIAGNOSIS — R4020 Unspecified coma: Secondary | ICD-10-CM | POA: Diagnosis not present

## 2015-03-21 DIAGNOSIS — S0510XA Contusion of eyeball and orbital tissues, unspecified eye, initial encounter: Secondary | ICD-10-CM | POA: Diagnosis not present

## 2015-03-21 DIAGNOSIS — Y9289 Other specified places as the place of occurrence of the external cause: Secondary | ICD-10-CM | POA: Diagnosis not present

## 2015-03-21 DIAGNOSIS — S0990XA Unspecified injury of head, initial encounter: Secondary | ICD-10-CM | POA: Diagnosis not present

## 2015-03-21 DIAGNOSIS — Y998 Other external cause status: Secondary | ICD-10-CM | POA: Diagnosis not present

## 2015-03-21 DIAGNOSIS — Y9389 Activity, other specified: Secondary | ICD-10-CM | POA: Insufficient documentation

## 2015-03-21 DIAGNOSIS — W19XXXA Unspecified fall, initial encounter: Secondary | ICD-10-CM

## 2015-03-21 NOTE — Discharge Instructions (Signed)
Fall Prevention and Home Safety °Falls cause injuries and can affect all age groups. It is possible to use preventive measures to significantly decrease the likelihood of falls. There are many simple measures which can make your home safer and prevent falls. °OUTDOORS °· Repair cracks and edges of walkways and driveways. °· Remove high doorway thresholds. °· Trim shrubbery on the main path into your home. °· Have good outside lighting. °· Clear walkways of tools, rocks, debris, and clutter. °· Check that handrails are not broken and are securely fastened. Both sides of steps should have handrails. °· Have leaves, snow, and ice cleared regularly. °· Use sand or salt on walkways during winter months. °· In the garage, clean up grease or oil spills. °BATHROOM °· Install night lights. °· Install grab bars by the toilet and in the tub and shower. °· Use non-skid mats or decals in the tub or shower. °· Place a plastic non-slip stool in the shower to sit on, if needed. °· Keep floors dry and clean up all water on the floor immediately. °· Remove soap buildup in the tub or shower on a regular basis. °· Secure bath mats with non-slip, double-sided rug tape. °· Remove throw rugs and tripping hazards from the floors. °BEDROOMS °· Install night lights. °· Make sure a bedside light is easy to reach. °· Do not use oversized bedding. °· Keep a telephone by your bedside. °· Have a firm chair with side arms to use for getting dressed. °· Remove throw rugs and tripping hazards from the floor. °KITCHEN °· Keep handles on pots and pans turned toward the center of the stove. Use back burners when possible. °· Clean up spills quickly and allow time for drying. °· Avoid walking on wet floors. °· Avoid hot utensils and knives. °· Position shelves so they are not too high or low. °· Place commonly used objects within easy reach. °· If necessary, use a sturdy step stool with a grab bar when reaching. °· Keep electrical cables out of the  way. °· Do not use floor polish or wax that makes floors slippery. If you must use wax, use non-skid floor wax. °· Remove throw rugs and tripping hazards from the floor. °STAIRWAYS °· Never leave objects on stairs. °· Place handrails on both sides of stairways and use them. Fix any loose handrails. Make sure handrails on both sides of the stairways are as long as the stairs. °· Check carpeting to make sure it is firmly attached along stairs. Make repairs to worn or loose carpet promptly. °· Avoid placing throw rugs at the top or bottom of stairways, or properly secure the rug with carpet tape to prevent slippage. Get rid of throw rugs, if possible. °· Have an electrician put in a light switch at the top and bottom of the stairs. °OTHER FALL PREVENTION TIPS °· Wear low-heel or rubber-soled shoes that are supportive and fit well. Wear closed toe shoes. °· When using a stepladder, make sure it is fully opened and both spreaders are firmly locked. Do not climb a closed stepladder. °· Add color or contrast paint or tape to grab bars and handrails in your home. Place contrasting color strips on first and last steps. °· Learn and use mobility aids as needed. Install an electrical emergency response system. °· Turn on lights to avoid dark areas. Replace light bulbs that burn out immediately. Get light switches that glow. °· Arrange furniture to create clear pathways. Keep furniture in the same place. °·   Firmly attach carpet with non-skid or double-sided tape. °· Eliminate uneven floor surfaces. °· Select a carpet pattern that does not visually hide the edge of steps. °· Be aware of all pets. °OTHER HOME SAFETY TIPS °· Set the water temperature for 120° F (48.8° C). °· Keep emergency numbers on or near the telephone. °· Keep smoke detectors on every level of the home and near sleeping areas. °Document Released: 10/12/2002 Document Revised: 04/22/2012 Document Reviewed: 01/11/2012 °ExitCare® Patient Information ©2015  [employer]. This information is not intended to replace advice given to you by your health care provider. Make sure you discuss any questions you have with your health care provider. °Contusion °A contusion is a deep bruise. Contusions are the result of an injury that caused bleeding under the skin. The contusion may turn blue, purple, or yellow. Minor injuries will give you a painless contusion, but more severe contusions may stay painful and swollen for a few weeks.  °CAUSES  °A contusion is usually caused by a blow, trauma, or direct force to an area of the body. °SYMPTOMS  °· Swelling and redness of the injured area. °· Bruising of the injured area. °· Tenderness and soreness of the injured area. °· Pain. °DIAGNOSIS  °The diagnosis can be made by taking a history and physical exam. An X-ray, CT scan, or MRI may be needed to determine if there were any associated injuries, such as fractures. °TREATMENT  °Specific treatment will depend on what area of the body was injured. In general, the best treatment for a contusion is resting, icing, elevating, and applying cold compresses to the injured area. Over-the-counter medicines may also be recommended for pain control. Ask your caregiver what the best treatment is for your contusion. °HOME CARE INSTRUCTIONS  °· Put ice on the injured area. °· Put ice in a plastic bag. °· Place a towel between your skin and the bag. °· Leave the ice on for 15-20 minutes, 3-4 times a day, or as directed by your health care provider. °· Only take over-the-counter or prescription medicines for pain, discomfort, or fever as directed by your caregiver. Your caregiver may recommend avoiding anti-inflammatory medicines (aspirin, ibuprofen, and naproxen) for 48 hours because these medicines may increase bruising. °· Rest the injured area. °· If possible, elevate the injured area to reduce swelling. °SEEK IMMEDIATE MEDICAL CARE IF:  °· You have increased bruising or swelling. °· You have  pain that is getting worse. °· Your swelling or pain is not relieved with medicines. °MAKE SURE YOU:  °· Understand these instructions. °· Will watch your condition. °· Will get help right away if you are not doing well or get worse. °Document Released: 08/01/2005 Document Revised: 10/27/2013 Document Reviewed: 08/27/2011 °ExitCare® Patient Information ©2015 [employer]. This information is not intended to replace advice given to you by your health care provider. Make sure you discuss any questions you have with your health care provider. ° °

## 2015-03-21 NOTE — ED Notes (Signed)
Patient presents [facility] s/p fall that occurred yesterday. Patient reports generalized "soreness". Bruising and swelling noted to RIGHT eye.

## 2015-03-21 NOTE — ED Provider Notes (Signed)
[facility] Emergency Department Provider Note  Time seen: 8:37 PM  I have reviewed the triage vital signs and the nursing notes.   HISTORY  Chief Complaint Fall and Eye Injury    HPI Aaron Foster is a 69 y.o. male who presents to the emergency department following a fall yesterday. According to his care provider who is here with the patient, she states the patient had a fall in the restroom yesterday hitting his right face. He had some swelling above his right eye and bruising but otherwise is acting normal so they did not seek immediate medical care. Today they noted his blood pressure to be low so they brought to the emergency department for further evaluation.Patient is largely nonverbal but can communicate with yes no answers. Denies loss of consciousness at time of fall. Patient denies any other pain besides left leg pain where he has a bruise.    No past medical history on file.  There are no active problems to display for this patient.   No past surgical history on file.  No current outpatient prescriptions on file.  Allergies Review of patient's allergies indicates not on file.  No family history on file.  Social History History  Substance Use Topics  . Smoking status: Not on file  . Smokeless tobacco: Not on file  . Alcohol Use: Not on file    Review of Systems Constitutional: Negative for fever. Eyes: Negative for visual changes, positive for right periorbital edema and ecchymosis ENT: Negative for congestion, cough or recent illness Cardiovascular: Negative for chest pain. Gastrointestinal: Negative for abdominal pain Musculoskeletal: Negative for back pain. Skin: Positive for ecchymosis around the right eyebrow Neurological: Negative for headaches, focal weakness or numbness.  10-point ROS otherwise negative.  ____________________________________________   PHYSICAL EXAM:  VITAL SIGNS: ED Triage Vitals  Enc Vitals  Group     BP 03/21/15 1845 96/50 mmHg     Pulse Rate 03/21/15 1845 89     Resp 03/21/15 1845 16     Temp 03/21/15 1845 96.2 F (35.7 C)     Temp Source 03/21/15 1845 Oral     SpO2 03/21/15 1845 95 %     Weight 03/21/15 1845 132 lb (59.875 kg)     Height 03/21/15 1845 5\' 6"  (1.676 m)     Head Cir --      Peak Flow --      Pain Score --      Pain Loc --      Pain Edu? --      Excl. in GC? --     Constitutional: Alert and oriented. Well appearing and in no distress. Eyes: PERRL, extraocular muscles intact, mild swelling above the right eye with mild ecchymosis. ENT   Head: Normocephalic   Nose: No congestion/rhinnorhea.   Mouth/Throat: Mucous membranes are moist. Cardiovascular: Normal rate, regular rhythm. Respiratory: Normal respiratory effort without tachypnea nor retractions. Breath sounds are clear  Gastrointestinal: Soft and nontender.  Musculoskeletal: Normal range of motion in all extremities, hips nontender, mild tenderness palpation to left lateral thigh where he has a mild bruise, normal range of motion of the knee and hip do not suspect any fractures. Neurologic:  Normal speech and language. No gross focal neurologic deficits  Skin:  Skin is warm, dry and intact.  Psychiatric: Mood and affect are at baseline according to caregiver.   ____________________________________________    RADIOLOGY  CT head and face within normal limits.  ____________________________________________  INITIAL IMPRESSION / ASSESSMENT AND PLAN / ED COURSE  Pertinent labs & imaging results that were available during my care of the patient were reviewed by me and considered in my medical decision making (see chart for details).  I will obtain CT scans to further evaluate. Otherwise the patient appears very well. Blood pressure is mildly low 96/50 however record review shows blood pressures as low as 96/60 during past ER visits. We'll monitor closely in the emergency  department.  ----------------------------------------- 9:34 PM on 03/21/2015 -----------------------------------------  CTs negative. Patient appears very well. Blood pressure on recheck is 113/68. We'll discharge home with primary care follow-up. Patient and caregiver agreeable to plan.  ____________________________________________   FINAL CLINICAL IMPRESSION(S) / ED DIAGNOSES  Fall Facial contusion   Minna AntisKevin Ladora Osterberg, MD 03/21/15 2135

## 2015-03-21 NOTE — ED Notes (Signed)
Spoke with Lenard LancePaduchowski, MD regarding presenting c/o and triage assessment. MD with [facility] for CT of head and maxillofacial. Orders to be entered by this RN.

## 2015-04-18 ENCOUNTER — Other Ambulatory Visit: Payer: Self-pay | Admitting: Family Medicine

## 2015-05-17 DIAGNOSIS — H6123 Impacted cerumen, bilateral: Secondary | ICD-10-CM | POA: Diagnosis not present

## 2015-05-17 DIAGNOSIS — H903 Sensorineural hearing loss, bilateral: Secondary | ICD-10-CM | POA: Diagnosis not present

## 2015-05-19 ENCOUNTER — Other Ambulatory Visit: Payer: Self-pay | Admitting: Urology

## 2015-05-25 ENCOUNTER — Encounter: Payer: Self-pay | Admitting: *Deleted

## 2015-05-25 ENCOUNTER — Other Ambulatory Visit: Payer: Self-pay | Admitting: *Deleted

## 2015-05-26 ENCOUNTER — Encounter: Payer: Self-pay | Admitting: Urology

## 2015-05-26 ENCOUNTER — Ambulatory Visit: Payer: Self-pay | Admitting: Urology

## 2015-06-01 ENCOUNTER — Ambulatory Visit: Payer: Self-pay | Admitting: Urology

## 2015-06-07 DIAGNOSIS — F419 Anxiety disorder, unspecified: Secondary | ICD-10-CM | POA: Diagnosis not present

## 2015-06-19 ENCOUNTER — Encounter: Payer: Self-pay | Admitting: Family Medicine

## 2015-06-19 DIAGNOSIS — G8929 Other chronic pain: Secondary | ICD-10-CM | POA: Insufficient documentation

## 2015-06-19 DIAGNOSIS — F79 Unspecified intellectual disabilities: Secondary | ICD-10-CM | POA: Insufficient documentation

## 2015-06-19 DIAGNOSIS — K219 Gastro-esophageal reflux disease without esophagitis: Secondary | ICD-10-CM | POA: Insufficient documentation

## 2015-06-21 ENCOUNTER — Other Ambulatory Visit: Payer: Self-pay | Admitting: Family Medicine

## 2015-06-21 ENCOUNTER — Encounter: Payer: Self-pay | Admitting: Urology

## 2015-06-21 ENCOUNTER — Ambulatory Visit (INDEPENDENT_AMBULATORY_CARE_PROVIDER_SITE_OTHER): Payer: Medicare Other | Admitting: Urology

## 2015-06-21 VITALS — BP 110/66 | HR 77 | Ht 66.0 in | Wt 139.2 lb

## 2015-06-21 DIAGNOSIS — N401 Enlarged prostate with lower urinary tract symptoms: Secondary | ICD-10-CM

## 2015-06-21 DIAGNOSIS — N138 Other obstructive and reflux uropathy: Secondary | ICD-10-CM | POA: Insufficient documentation

## 2015-06-21 DIAGNOSIS — N400 Enlarged prostate without lower urinary tract symptoms: Secondary | ICD-10-CM | POA: Diagnosis not present

## 2015-06-21 LAB — BLADDER SCAN AMB NON-IMAGING: Scan Result: 46

## 2015-06-21 MED ORDER — TAMSULOSIN HCL 0.4 MG PO CAPS: 0.4000 mg | ORAL_CAPSULE | Freq: Every day | ORAL | Status: DC

## 2015-06-21 NOTE — Telephone Encounter (Signed)
Patient requesting refill. 

## 2015-06-21 NOTE — Progress Notes (Signed)
06/21/2015 10:30 AM   Aaron Foster 06/28/1946 [id number]  Referring provider: Alba Cory, MD [address] [place], [place] [address]  Chief Complaint  Patient presents with  . Benign Prostatic Hypertrophy    one year check up    HPI: Aaron Foster a 69 year old white male with BPH and LUTS who presents today for yearly follow up.  His IPSS score today is 2, which is mild lower urinary tract symptomatology. [name] mostly satisfied with his quality life due to his urinary symptoms. His PVR is 46 mL.    He no urinary complaints at this time.   He denies any dysuria, hematuria or suprapubic pain.   He currently taking tamsulosin.  Previous PSA's:     0.3 ng/mL on 05/11/2014     He also denies any recent fevers, chills, nausea or vomiting.  His family history is unknown.       IPSS      06/21/15 1000       International Prostate Symptom Score   How often have you had the sensation of not emptying your bladder? Not at All     How often have you had to urinate less than every two hours? Not at All     How often have you found you stopped and started again several times when you urinated? Not at All     How often have you found it difficult to postpone urination? Not at All     How often have you had a weak urinary stream? Not at All     How often have you had to strain to start urination? Not at All     How many times did you typically get up at night to urinate? 2 Times     Total IPSS Score 2     Quality of Life due to urinary symptoms   If you were to spend the rest of your life with your urinary condition just the way it is now how would you feel about that? Mostly Satisfied        Score:  1-7 Mild 8-19 Moderate 20-35 Severe     PMH: Past Medical History  Diagnosis Date  . HLD (hyperlipidemia)   . Chronic back pain   . Seborrheic dermatitis   . Cannot hear   . Insomnia   . Hemorrhoid   . Constipation   . Esophageal reflux   .  Osteoporosis   . Anemia   . BPH (benign prostatic hyperplasia)   . Phimosis   . Nephrolithiasis   . Bilateral renal cysts     Surgical History: Past Surgical History  Procedure Laterality Date  . Arm fracture      Home Medications:    Medication List       This list is accurate as of: 06/21/15 10:30 AM.  Always use your most recent med list.               acetaminophen 500 MG tablet  Commonly known as:  TYLENOL  Take 1,000 mg by mouth.     albuterol (2.5 MG/3ML) 0.083% nebulizer solution  Commonly known as:  PROVENTIL  Take 2.5 mg by nebulization every 6 (six) hours as needed for wheezing or shortness of breath.     ALPRAZolam 0.5 MG tablet  Commonly known as:  XANAX  Take 0.5 mg by mouth at bedtime as needed for anxiety.     aspirin 81 MG EC tablet  Commonly known as:  ASPIR-LOW  Take 1 tablet (81 mg total) by mouth daily.     ATIVAN 1 MG tablet  Generic drug:  LORazepam  Take 1 mg by mouth.     atorvastatin 20 MG tablet  Commonly known as:  LIPITOR  Take 20 mg by mouth.     calcitonin (salmon) 200 UNIT/ACT nasal spray  Commonly known as:  MIACALCIN/FORTICAL  Place into the nose.     Calcium Carbonate-Vitamin D 600-400 MG-UNIT per tablet  Take by mouth.     carboxymethylcellulose 0.5 % Soln  Commonly known as:  REFRESH PLUS  1 drop 3 (three) times daily as needed.     diclofenac 75 MG EC tablet  Commonly known as:  VOLTAREN  Take 75 mg by mouth.     docusate sodium 100 MG capsule  Commonly known as:  COLACE  Take 1 capsule (100 mg total) by mouth daily.     DULoxetine 30 MG capsule  Commonly known as:  CYMBALTA  Take 90 mg by mouth daily.     gabapentin 100 MG capsule  Commonly known as:  NEURONTIN  Take 100 mg by mouth 3 (three) times daily.     ibuprofen 800 MG tablet  Commonly known as:  ADVIL,MOTRIN  Take 800 mg by mouth every 8 (eight) hours as needed.     ketoconazole 2 % cream  Commonly known as:  NIZORAL  Apply topically.      LIDODERM 5 %  Generic drug:  lidocaine  Place onto the skin.     mometasone 0.1 % lotion  Commonly known as:  ELOCON  Apply topically daily.     NEXIUM 40 MG capsule  Generic drug:  esomeprazole  Take 40 mg by mouth.     orphenadrine 100 MG tablet  Commonly known as:  NORFLEX  Take 100 mg by mouth.     polyethylene glycol powder powder  Commonly known as:  GLYCOLAX/MIRALAX  Take 17 g by mouth daily.     SEROQUEL 100 MG tablet  Generic drug:  QUEtiapine  Take by mouth.     tamsulosin 0.4 MG Caps capsule  Commonly known as:  FLOMAX  TAKE 1 CAPSULE BY MOUTH ONCE DAILY FOR BPH.     traZODone 100 MG tablet  Commonly known as:  DESYREL  Take 1 tablet (100 mg total) by mouth at bedtime.        Allergies: No Known Allergies  Family History: No family history on file.  Social History:  reports that he has never smoked. He does not have any smokeless tobacco history on file. He reports that he does not drink alcohol or use illicit drugs.  ROS: UROLOGY Frequent Urination?: No Hard to postpone urination?: No Burning/pain with urination?: No Get up at night to urinate?: Yes Leakage of urine?: No Urine stream starts and stops?: No Trouble starting stream?: No Do you have to strain to urinate?: No Blood in urine?: No Urinary tract infection?: No Sexually transmitted disease?: No Injury to kidneys or bladder?: No Painful intercourse?: No Weak stream?: No Erection problems?: No Penile pain?: No  Gastrointestinal Nausea?: No Vomiting?: No Indigestion/heartburn?: No Diarrhea?: No Constipation?: No  Constitutional Fever: No Night sweats?: No Weight loss?: No Fatigue?: No  Skin Skin rash/lesions?: No Itching?: No  Eyes Blurred vision?: No Double vision?: No  Ears/Nose/Throat Sore throat?: No Sinus problems?: No  Hematologic/Lymphatic Swollen glands?: No Easy bruising?: No  Cardiovascular Leg swelling?: No Chest pain?: No  Respiratory Cough?:  No Shortness of breath?: No  Endocrine Excessive thirst?: No  Musculoskeletal Back pain?: No Joint pain?: No  Neurological Headaches?: No Dizziness?: No  Psychologic Depression?: No Anxiety?: No  Physical Exam: BP 110/66 mmHg  Pulse 77  Ht  (1.676 m)  Wt 139 lb 3.2 oz (63.141 kg)  BMI 22.48 kg/m2  GU: Patient with circumcised phallus.   Urethral meatus is patent.  No penile discharge. No penile lesions or rashes. Scrotum without lesions, cysts, rashes and/or edema.  Testicles are located scrotally bilaterally. No masses are appreciated in the testicles. Left and right epididymis are normal.  Rectal: Patient with  normal sphincter tone. Perineum without scarring or rashes. No rectal masses are appreciated. Prostate is approximately 50 grams, no nodules are appreciated. Seminal vesicles are normal.   Laboratory Data: Lab Results  Component Value Date   WBC 8.7 06/23/2013   HGB 12.1* 06/23/2013   HCT 34.8* 06/23/2013   MCV 92 06/23/2013   PLT 395 06/23/2013    Lab Results  Component Value Date   CREATININE 0.94 06/23/2013    No results found for: PSA  No results found for: TESTOSTERONE  No results found for: HGBA1C  Urinalysis    Component Value Date/Time   COLORURINE Amber 06/20/2013 1606   APPEARANCEUR Hazy 06/20/2013 1606   LABSPEC 1.039 06/20/2013 1606   PHURINE 5.0 06/20/2013 1606   GLUCOSEU Negative 06/20/2013 1606   HGBUR 1+ 06/20/2013 1606   BILIRUBINUR 2+ 06/20/2013 1606   KETONESUR 1+ 06/20/2013 1606   PROTEINUR >=500 06/20/2013 1606   NITRITE Negative 06/20/2013 1606   LEUKOCYTESUR Negative 06/20/2013 1606    Pertinent Imaging: Results for orders placed or performed in visit on 06/21/15  BLADDER SCAN AMB NON-IMAGING  Result Value Ref Range   Scan Result 46     Assessment & Plan:    1. BPH (benign prostatic hyperplasia) with LUTS:     Patient's IPSS score is 2/2.  His PVR 46 mL.  His DRE demonstrates enlargement.  He will continue  the tamsulosin and refill was sent to his pharmacy.   He will follow up in 12 months for a PSA, DRE, PVR and an IPSS.    - PSA - BLADDER SCAN AMB NON-IMAGING   No Follow-up on file.  Michiel Cowboy, PA-C  [facility] [address] [place], [place] [address] [phone]

## 2015-06-22 ENCOUNTER — Telehealth: Payer: Self-pay

## 2015-06-22 LAB — PSA: Prostate Specific Ag, Serum: 0.3 ng/mL (ref 0.0–4.0)

## 2015-06-22 NOTE — Telephone Encounter (Signed)
No answer

## 2015-06-22 NOTE — Telephone Encounter (Signed)
-----   Message from Harle Battiest, PA-C sent at 06/22/2015  8:08 AM EDT ----- Patient's PSA is stable.  We will see him in 6 months.

## 2015-06-23 ENCOUNTER — Ambulatory Visit: Payer: Self-pay | Admitting: Family Medicine

## 2015-06-23 NOTE — Telephone Encounter (Signed)
-----   Message from Shannon A McGowan, PA-C sent at 06/22/2015  8:08 AM EDT ----- Patient's PSA is stable.  We will see him in 6 months. 

## 2015-06-23 NOTE — Telephone Encounter (Signed)
No answer

## 2015-06-24 ENCOUNTER — Encounter: Payer: Self-pay | Admitting: Family Medicine

## 2015-06-24 ENCOUNTER — Ambulatory Visit (INDEPENDENT_AMBULATORY_CARE_PROVIDER_SITE_OTHER): Payer: Medicare Other | Admitting: Family Medicine

## 2015-06-24 VITALS — BP 118/66 | HR 85 | Temp 98.3°F | Resp 16 | Ht 66.0 in | Wt 138.8 lb

## 2015-06-24 DIAGNOSIS — L219 Seborrheic dermatitis, unspecified: Secondary | ICD-10-CM | POA: Insufficient documentation

## 2015-06-24 DIAGNOSIS — Z23 Encounter for immunization: Secondary | ICD-10-CM

## 2015-06-24 DIAGNOSIS — M51369 Other intervertebral disc degeneration, lumbar region without mention of lumbar back pain or lower extremity pain: Secondary | ICD-10-CM | POA: Insufficient documentation

## 2015-06-24 DIAGNOSIS — R2681 Unsteadiness on feet: Secondary | ICD-10-CM | POA: Insufficient documentation

## 2015-06-24 DIAGNOSIS — Z79899 Other long term (current) drug therapy: Secondary | ICD-10-CM | POA: Diagnosis not present

## 2015-06-24 DIAGNOSIS — Z862 Personal history of diseases of the blood and blood-forming organs and certain disorders involving the immune mechanism: Secondary | ICD-10-CM

## 2015-06-24 DIAGNOSIS — Z974 Presence of external hearing-aid: Secondary | ICD-10-CM | POA: Diagnosis not present

## 2015-06-24 DIAGNOSIS — Z593 Problems related to living in residential institution: Secondary | ICD-10-CM

## 2015-06-24 DIAGNOSIS — F919 Conduct disorder, unspecified: Secondary | ICD-10-CM

## 2015-06-24 DIAGNOSIS — M810 Age-related osteoporosis without current pathological fracture: Secondary | ICD-10-CM | POA: Insufficient documentation

## 2015-06-24 DIAGNOSIS — E46 Unspecified protein-calorie malnutrition: Secondary | ICD-10-CM | POA: Diagnosis not present

## 2015-06-24 DIAGNOSIS — Z6222 Institutional upbringing: Secondary | ICD-10-CM | POA: Diagnosis not present

## 2015-06-24 DIAGNOSIS — H9193 Unspecified hearing loss, bilateral: Secondary | ICD-10-CM | POA: Insufficient documentation

## 2015-06-24 DIAGNOSIS — K5900 Constipation, unspecified: Secondary | ICD-10-CM | POA: Diagnosis not present

## 2015-06-24 DIAGNOSIS — F69 Unspecified disorder of adult personality and behavior: Secondary | ICD-10-CM | POA: Insufficient documentation

## 2015-06-24 DIAGNOSIS — Z789 Other specified health status: Secondary | ICD-10-CM | POA: Insufficient documentation

## 2015-06-24 DIAGNOSIS — K5909 Other constipation: Secondary | ICD-10-CM | POA: Insufficient documentation

## 2015-06-24 DIAGNOSIS — Z8379 Family history of other diseases of the digestive system: Secondary | ICD-10-CM | POA: Insufficient documentation

## 2015-06-24 DIAGNOSIS — E785 Hyperlipidemia, unspecified: Secondary | ICD-10-CM | POA: Diagnosis not present

## 2015-06-24 DIAGNOSIS — S32010A Wedge compression fracture of first lumbar vertebra, initial encounter for closed fracture: Secondary | ICD-10-CM | POA: Insufficient documentation

## 2015-06-24 DIAGNOSIS — K649 Unspecified hemorrhoids: Secondary | ICD-10-CM | POA: Insufficient documentation

## 2015-06-24 DIAGNOSIS — N200 Calculus of kidney: Secondary | ICD-10-CM | POA: Insufficient documentation

## 2015-06-24 DIAGNOSIS — K449 Diaphragmatic hernia without obstruction or gangrene: Secondary | ICD-10-CM | POA: Insufficient documentation

## 2015-06-24 DIAGNOSIS — M5136 Other intervertebral disc degeneration, lumbar region: Secondary | ICD-10-CM | POA: Insufficient documentation

## 2015-06-24 MED ORDER — LUBIPROSTONE 8 MCG PO CAPS: 8.0000 ug | ORAL_CAPSULE | Freq: Two times a day (BID) | ORAL | Status: DC
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
## 2015-06-24 NOTE — Telephone Encounter (Signed)
No answer. Will send a letter.  

## 2015-06-24 NOTE — Telephone Encounter (Signed)
-----   Message from Shannon A McGowan, PA-C sent at 06/22/2015  8:08 AM EDT ----- Patient's PSA is stable.  We will see him in 6 months. 

## 2015-07-19 ENCOUNTER — Other Ambulatory Visit: Payer: Self-pay | Admitting: Family Medicine

## 2015-07-27 DIAGNOSIS — R0781 Pleurodynia: Secondary | ICD-10-CM | POA: Diagnosis not present

## 2015-08-04 ENCOUNTER — Encounter: Payer: Self-pay | Admitting: Family Medicine

## 2015-08-04 ENCOUNTER — Ambulatory Visit (INDEPENDENT_AMBULATORY_CARE_PROVIDER_SITE_OTHER): Payer: Medicare Other | Admitting: Family Medicine

## 2015-08-04 VITALS — BP 122/68 | HR 90 | Temp 97.3°F | Resp 18 | Ht 66.0 in | Wt 138.1 lb

## 2015-08-04 DIAGNOSIS — F69 Unspecified disorder of adult personality and behavior: Secondary | ICD-10-CM

## 2015-08-04 DIAGNOSIS — R2681 Unsteadiness on feet: Secondary | ICD-10-CM | POA: Diagnosis not present

## 2015-08-04 DIAGNOSIS — G8929 Other chronic pain: Secondary | ICD-10-CM

## 2015-08-04 DIAGNOSIS — Z23 Encounter for immunization: Secondary | ICD-10-CM | POA: Diagnosis not present

## 2015-08-04 DIAGNOSIS — F919 Conduct disorder, unspecified: Secondary | ICD-10-CM

## 2015-08-04 DIAGNOSIS — R296 Repeated falls: Secondary | ICD-10-CM | POA: Diagnosis not present

## 2015-08-04 DIAGNOSIS — K5900 Constipation, unspecified: Secondary | ICD-10-CM

## 2015-08-04 DIAGNOSIS — K5909 Other constipation: Secondary | ICD-10-CM

## 2015-08-04 MED ORDER — LUBIPROSTONE 24 MCG PO CAPS: 24.0000 ug | ORAL_CAPSULE | Freq: Two times a day (BID) | ORAL | Status: DC

## 2015-08-04 MED ORDER — ORPHENADRINE CITRATE ER 100 MG PO TB12: 100.0000 mg | ORAL_TABLET | Freq: Two times a day (BID) | ORAL | Status: DC

## 2015-08-04 NOTE — Addendum Note (Signed)
Addended by: Cynda Familia on: 08/04/2015 03:01 PM   Modules accepted: Orders
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
## 2015-08-08 ENCOUNTER — Other Ambulatory Visit: Payer: Self-pay | Admitting: Family Medicine

## 2015-08-18 ENCOUNTER — Other Ambulatory Visit: Payer: Self-pay

## 2015-08-18 DIAGNOSIS — K5909 Other constipation: Secondary | ICD-10-CM

## 2015-08-18 MED ORDER — LUBIPROSTONE 24 MCG PO CAPS: 24.0000 ug | ORAL_CAPSULE | Freq: Two times a day (BID) | ORAL | Status: DC

## 2015-08-18 MED ORDER — ALPRAZOLAM 0.5 MG PO TABS: 0.5000 mg | ORAL_TABLET | Freq: Every evening | ORAL | Status: DC | PRN

## 2015-08-18 MED ORDER — GABAPENTIN 100 MG PO CAPS: 100.0000 mg | ORAL_CAPSULE | Freq: Three times a day (TID) | ORAL | Status: DC

## 2015-08-24 ENCOUNTER — Encounter: Payer: Self-pay | Admitting: Family Medicine

## 2015-08-24 ENCOUNTER — Ambulatory Visit (INDEPENDENT_AMBULATORY_CARE_PROVIDER_SITE_OTHER): Payer: Medicare Other | Admitting: Family Medicine

## 2015-08-24 VITALS — BP 122/68 | HR 99 | Temp 98.7°F | Resp 18 | Ht 66.0 in | Wt 137.7 lb

## 2015-08-24 DIAGNOSIS — K5900 Constipation, unspecified: Secondary | ICD-10-CM

## 2015-08-24 DIAGNOSIS — K5909 Other constipation: Secondary | ICD-10-CM

## 2015-08-24 NOTE — Addendum Note (Signed)
Addended by: Alba CorySOWLES, Jaggar Benko F on: 08/24/2015 11:43 AM   Modules accepted: Level of Service

## 2015-08-24 NOTE — Progress Notes (Signed)
Name: Aaron Foster   MRN: [id number]    DOB: 04-14-1946   Date:08/24/2015       Progress Note  Subjective  Chief Complaint  Chief Complaint  Patient presents with  . Medication Management    2 month F/U Constipation  . Constipation    Increased Amitiza from 8 to 24 mcg one tablet bid, going to bathroom twice daily and improved symptoms.     HPI  Chronic Constipation: he is now on Amitiza 24 mcg twice daily and has bowel movements about twice daily, no straining, no blood in stools and stools are formed but not hard. He also stopped getting up during the night to have a bowel movement. No fever or abdominal pain  Patient Active Problem List   Diagnosis Date Noted  . Recurrent falls 08/04/2015  . Dermatitis seborrheica 06/24/2015  . Chronic constipation 06/24/2015  . Lives in group home 06/24/2015  . Protein calorie malnutrition (HCC) 06/24/2015  . Hearing loss of both ears 06/24/2015  . Wears hearing aid 06/24/2015  . History of iron deficiency anemia 06/24/2015  . Osteoporosis 06/24/2015  . Adult behavior problem 06/24/2015  . Hiatal hernia 06/24/2015  . Hemorrhoid 06/24/2015  . Gait instability 06/24/2015  . DDD (degenerative disc disease), lumbar 06/24/2015  . Nephrolithiasis 06/24/2015  . Hyperlipidemia 06/24/2015  . Compression fracture of L1 lumbar vertebra (HCC) 06/24/2015  . Family history of upper GI bleeding   . BPH with obstruction/lower urinary tract symptoms 06/21/2015  . Chronic pain 06/19/2015  . Acid reflux 06/19/2015  . Intellectual disability 06/19/2015    Past Surgical History  Procedure Laterality Date  . Arm fracture      History reviewed. No pertinent family history.  Social History   Social History  . Marital Status: Single    Spouse Name: N/A  . Number of Children: N/A  . Years of Education: N/A   Occupational History  . Not on file.   Social History Main Topics  . Smoking status: Former Games developer  . Smokeless tobacco: Never  Used  . Alcohol Use: No  . Drug Use: No  . Sexual Activity: Not Currently   Other Topics Concern  . Not on file   Social History Narrative     Current outpatient prescriptions:  .  ALPRAZolam (XANAX) 0.5 MG tablet, Take 1 tablet (0.5 mg total) by mouth at bedtime as needed for anxiety., Disp: 90 tablet, Rfl: 2 .  aspirin (ASPIR-LOW) 81 MG EC tablet, Take 1 tablet (81 mg total) by mouth daily., Disp: 30 tablet, Rfl: 12 .  calcitonin, salmon, (MIACALCIN/FORTICAL) 200 UNIT/ACT nasal spray, Place into the nose., Disp: , Rfl:  .  Calcium Carbonate-Vitamin D 600-400 MG-UNIT per tablet, Take by mouth., Disp: , Rfl:  .  carboxymethylcellulose (REFRESH PLUS) 0.5 % SOLN, 1 drop 3 (three) times daily as needed., Disp: , Rfl:  .  diclofenac (VOLTAREN) 75 MG EC tablet, Take 75 mg by mouth., Disp: , Rfl:  .  DULoxetine (CYMBALTA) 30 MG capsule, Take 90 mg by mouth daily., Disp: , Rfl:  .  gabapentin (NEURONTIN) 100 MG capsule, Take 1 capsule (100 mg total) by mouth 3 (three) times daily., Disp: 90 capsule, Rfl: 12 .  ibuprofen (ADVIL,MOTRIN) 800 MG tablet, Take 800 mg by mouth every 8 (eight) hours as needed., Disp: , Rfl:  .  ketoconazole (NIZORAL) 2 % cream, Apply topically., Disp: , Rfl:  .  lidocaine (LIDODERM) 5 %, Place onto the skin., Disp: , Rfl:  .  LORazepam (ATIVAN) 1 MG tablet, Take 1 mg by mouth., Disp: , Rfl:  .  lubiprostone (AMITIZA) 24 MCG capsule, Take 1 capsule (24 mcg total) by mouth 2 (two) times daily with a meal., Disp: 60 capsule, Rfl: 12 .  MAPAP 500 MG tablet, TAKE 2 TABLETS BY MOUTH 3 TIMES DAILY FOR PAIN, Disp: 180 tablet, Rfl: 2 .  mometasone (ELOCON) 0.1 % lotion, Apply topically daily., Disp: , Rfl:  .  NEXIUM 40 MG capsule, TAKE 1 CAPSULE BY MOUTH, AT LEAST :30 PRIOR TO FOOD, ONCE DAILY FOR REFLUX, Disp: 30 capsule, Rfl: 5 .  orphenadrine (NORFLEX) 100 MG tablet, Take 1 tablet (100 mg total) by mouth 2 (two) times daily., Disp: 60 tablet, Rfl: 5 .  QUEtiapine  (SEROQUEL) 100 MG tablet, Take by mouth., Disp: , Rfl:  .  REFRESH 1 % ophthalmic solution, INSTILL 1 DROP INTO EACH EYE 3 TIMES DAILY FOR 30 DAYS., Disp: 15 mL, Rfl: 12 .  tamsulosin (FLOMAX) 0.4 MG CAPS capsule, Take 1 capsule (0.4 mg total) by mouth daily., Disp: 30 capsule, Rfl: 12 .  traZODone (DESYREL) 100 MG tablet, Take 1 tablet (100 mg total) by mouth at bedtime., Disp: 30 tablet, Rfl: 12  No Known Allergies   ROS  Ten systems reviewed and is negative except as mentioned in HPI   Objective  Filed Vitals:   08/24/15 1113  BP: 122/68  Pulse: 99  Temp: 98.7 F (37.1 C)  TempSrc: Oral  Resp: 18  Height:  (1.676 m)  Weight: 137 lb 11.2 oz (62.46 kg)  SpO2: 94%    Body mass index is 22.24 kg/(m^2).  Physical Exam  Constitutional: Patient appears well-developed and well-nourished. No distress.  Skin: seborrheic keratosis face HEENT: head atraumatic, normocephalic,neck supple, throat within normal limits Cardiovascular: Normal rate, regular rhythm and normal heart sounds. No murmur heard. No BLE edema. Pulmonary/Chest: Effort normal and breath sounds normal. No respiratory distress. Abdominal: Soft. There is no tenderness. Normal bowel sounds Psychiatric: Patient has a normal mood and affect. behavior is normal. Judgment and thought content normal. Muscular skeletal: slow gait, with walker assistance, brace on left wrist - " makes him feel better ", mild  pain during palpation of left  lumbar spine   Recent Results (from the past 2160 hour(s))  PSA     Status: None   Collection Time: 06/21/15  9:58 AM  Result Value Ref Range   Prostate Specific Ag, Serum 0.3 0.0 - 4.0 ng/mL    Comment: Roche ECLIA methodology. According to the American Urological Association, Serum PSA should decrease and remain at undetectable levels after radical prostatectomy. The AUA defines biochemical recurrence as an initial PSA value 0.2 ng/mL or greater followed by a subsequent  confirmatory PSA value 0.2 ng/mL or greater. Values obtained with different assay methods or kits cannot be used interchangeably. Results cannot be interpreted as absolute evidence of the presence or absence of malignant disease.   BLADDER SCAN AMB NON-IMAGING     Status: None   Collection Time: 06/21/15 10:18 AM  Result Value Ref Range   Scan Result 46     PHQ2/9: Depression screen PHQ 2/9 06/24/2015  Decreased Interest 0  Down, Depressed, Hopeless 0  PHQ - 2 Score 0     Fall Risk: Fall Risk  08/04/2015 06/24/2015  Falls in the past year? Yes No  Number falls in past yr: 2 or more -  Injury with Fall? Yes -  Risk Factor Category  High  Fall Risk -  Risk for fall due to : History of fall(s);Impaired balance/gait;Impaired mobility -  Follow up Falls prevention discussed -    Functional Status Survey: Is the patient deaf or have difficulty hearing?: Yes (hearing aid in left ear) Does the patient have difficulty seeing, even when wearing glasses/contacts?: Yes (glasses) Does the patient have difficulty concentrating, remembering, or making decisions?: Yes Does the patient have difficulty walking or climbing stairs?: Yes (walks with a walker) Does the patient have difficulty dressing or bathing?: Yes (mostly him self but has to have assist with buttons and zippers) Does the patient have difficulty doing errands alone such as visiting a doctor's office or shopping?: Yes (does not drive)   Assessment & Plan  1. Chronic constipation  Doing well, continue current regiment of Amitiza, explained that if diarrhea develops can back it down to once daily

## 2015-09-06 DIAGNOSIS — F419 Anxiety disorder, unspecified: Secondary | ICD-10-CM | POA: Diagnosis not present

## 2015-10-17 ENCOUNTER — Other Ambulatory Visit: Payer: Self-pay | Admitting: Family Medicine

## 2015-10-17 NOTE — Telephone Encounter (Signed)
Patient requesting refill. 

## 2015-11-24 ENCOUNTER — Other Ambulatory Visit: Payer: Self-pay

## 2015-11-24 MED ORDER — ACETAMINOPHEN 500 MG PO TABS: 1000.0000 mg | ORAL_TABLET | Freq: Three times a day (TID) | ORAL | Status: DC | PRN

## 2015-11-24 NOTE — Telephone Encounter (Signed)
Aaron Foster ask that we please send a 90 day supply of medications.

## 2015-12-13 DIAGNOSIS — F419 Anxiety disorder, unspecified: Secondary | ICD-10-CM | POA: Diagnosis not present

## 2015-12-14 ENCOUNTER — Other Ambulatory Visit: Payer: Self-pay | Admitting: Family Medicine

## 2015-12-14 NOTE — Telephone Encounter (Signed)
Patient requesting refill. 

## 2015-12-15 DIAGNOSIS — H6123 Impacted cerumen, bilateral: Secondary | ICD-10-CM | POA: Diagnosis not present

## 2015-12-15 DIAGNOSIS — H903 Sensorineural hearing loss, bilateral: Secondary | ICD-10-CM | POA: Diagnosis not present

## 2015-12-21 ENCOUNTER — Telehealth: Payer: Self-pay

## 2015-12-21 MED ORDER — ESOMEPRAZOLE MAGNESIUM 40 MG PO PACK: 40.0000 mg | PACK | Freq: Every day | ORAL | Status: DC

## 2015-12-21 NOTE — Telephone Encounter (Signed)
Got a fax from [facility] stating that the request for Nexium Oral Capsule Delayed Release  has been denied, but the generic (Esomeprazole Magnesium) is covered.  They also said that the request for Lidocaine External Patch 5% is not covered due to it only being approved for diabetic neuropathy, pain associated with cancer-related neuropathy, or post herpactic neuralgia.

## 2015-12-23 ENCOUNTER — Telehealth: Payer: Self-pay | Admitting: Family Medicine

## 2015-12-23 NOTE — Telephone Encounter (Signed)
Called to speak with Maggie but, she has left for the day. So, spoke with pharmacist notified him that I spoke with [facility] 2-3 times this week, and the status is still pending on the lidocaine patches. I will notify them once we receive a determination.

## 2015-12-23 NOTE — Telephone Encounter (Signed)
Maggie from [facility] is requesting return call [phone]. Checking status on the authorization for Lidocaine Patch

## 2016-01-17 ENCOUNTER — Other Ambulatory Visit: Payer: Self-pay | Admitting: Family Medicine

## 2016-01-23 ENCOUNTER — Telehealth: Payer: Self-pay

## 2016-01-23 NOTE — Telephone Encounter (Signed)
[facility]

## 2016-02-02 DIAGNOSIS — H04122 Dry eye syndrome of left lacrimal gland: Secondary | ICD-10-CM | POA: Diagnosis not present

## 2016-02-02 DIAGNOSIS — H04121 Dry eye syndrome of right lacrimal gland: Secondary | ICD-10-CM | POA: Diagnosis not present

## 2016-02-02 DIAGNOSIS — H16143 Punctate keratitis, bilateral: Secondary | ICD-10-CM | POA: Diagnosis not present

## 2016-02-02 DIAGNOSIS — H2513 Age-related nuclear cataract, bilateral: Secondary | ICD-10-CM | POA: Diagnosis not present

## 2016-02-07 ENCOUNTER — Other Ambulatory Visit: Payer: Self-pay | Admitting: Family Medicine

## 2016-02-22 ENCOUNTER — Ambulatory Visit: Payer: Medicare Other | Admitting: Family Medicine

## 2016-02-22 ENCOUNTER — Ambulatory Visit (INDEPENDENT_AMBULATORY_CARE_PROVIDER_SITE_OTHER): Payer: Medicare Other | Admitting: Family Medicine

## 2016-02-22 ENCOUNTER — Encounter: Payer: Self-pay | Admitting: Family Medicine

## 2016-02-22 VITALS — BP 122/76 | HR 94 | Temp 97.9°F | Resp 16 | Wt 128.6 lb

## 2016-02-22 DIAGNOSIS — R2681 Unsteadiness on feet: Secondary | ICD-10-CM

## 2016-02-22 DIAGNOSIS — E46 Unspecified protein-calorie malnutrition: Secondary | ICD-10-CM | POA: Diagnosis not present

## 2016-02-22 DIAGNOSIS — N400 Enlarged prostate without lower urinary tract symptoms: Secondary | ICD-10-CM

## 2016-02-22 DIAGNOSIS — E785 Hyperlipidemia, unspecified: Secondary | ICD-10-CM | POA: Diagnosis not present

## 2016-02-22 DIAGNOSIS — Z6222 Institutional upbringing: Secondary | ICD-10-CM | POA: Diagnosis not present

## 2016-02-22 DIAGNOSIS — H9193 Unspecified hearing loss, bilateral: Secondary | ICD-10-CM

## 2016-02-22 DIAGNOSIS — G8929 Other chronic pain: Secondary | ICD-10-CM

## 2016-02-22 DIAGNOSIS — Z593 Problems related to living in residential institution: Secondary | ICD-10-CM

## 2016-02-22 DIAGNOSIS — Z79899 Other long term (current) drug therapy: Secondary | ICD-10-CM | POA: Diagnosis not present

## 2016-02-22 DIAGNOSIS — Z862 Personal history of diseases of the blood and blood-forming organs and certain disorders involving the immune mechanism: Secondary | ICD-10-CM

## 2016-02-22 DIAGNOSIS — S32010D Wedge compression fracture of first lumbar vertebra, subsequent encounter for fracture with routine healing: Secondary | ICD-10-CM | POA: Diagnosis not present

## 2016-02-22 DIAGNOSIS — K5900 Constipation, unspecified: Secondary | ICD-10-CM

## 2016-02-22 DIAGNOSIS — M419 Scoliosis, unspecified: Secondary | ICD-10-CM | POA: Insufficient documentation

## 2016-02-22 DIAGNOSIS — Z0000 Encounter for general adult medical examination without abnormal findings: Secondary | ICD-10-CM

## 2016-02-22 DIAGNOSIS — F79 Unspecified intellectual disabilities: Secondary | ICD-10-CM

## 2016-02-22 DIAGNOSIS — Z974 Presence of external hearing-aid: Secondary | ICD-10-CM

## 2016-02-22 DIAGNOSIS — Z789 Other specified health status: Secondary | ICD-10-CM

## 2016-02-22 DIAGNOSIS — K5909 Other constipation: Secondary | ICD-10-CM

## 2016-02-22 DIAGNOSIS — M810 Age-related osteoporosis without current pathological fracture: Secondary | ICD-10-CM

## 2016-02-22 NOTE — Patient Instructions (Signed)
Discussed importance of 150 minutes of physical activity weekly, eat two servings of fish weekly, eat one serving of tree nuts ( cashews, pistachios, pecans, almonds..) every other day, eat 6 servings of fruit/vegetables daily and drink plenty of water and avoid sweet beverages. 

## 2016-02-22 NOTE — Progress Notes (Signed)
Name: Aaron Foster   MRN: [id number]    DOB: 1945-11-26   Date:02/22/2016       Progress Note  Subjective  Chief Complaint  Chief Complaint  Patient presents with  . Annual Exam  . Hand Pain    right  . Wrist Pain    left    HPI  Functional ability/safety issues: He lives in a group home - [facility] Hearing issues: wears hearing aid Activities of daily living: Discussed - depending on staff of group home for most activities of daily living Home safety issues: No Issues - continue walker use  End Of Life Planning: Offered verbal information regarding advanced directives, healthcare power of attorney ( sister - caregiver is not sure of the name of his sister that  has it )  Preventative care, Health maintenance, Preventative health measures discussed.  Preventative screenings discussed today: lab work, colonoscopy, PSA.  Men age 62 to 75 years if ever smoked recommended to get a one time AAA ultrasound screening exam. N/A  Low Dose CT Chest recommended if Age 70-80 years, 30 pack-year currently smoking OR have quit w/in 15years.   Lifestyle risk factor issued reviewed: Diet, exercise, weight management, advised patient smoking is not healthy, nutrition/diet.  Preventative health measures discussed (5-10 year plan).  Reviewed and recommended vaccinations: - Pneumovax  - Prevnar  - Annual Influenza - Zostavax - Tdap   Depression screening: Done Fall risk screening: Done Discuss ADLs/IADLs: Done  Current medical providers: See HPI  Other health risk factors identified this visit: No other issues Cognitive impairment issues: None identified  All above discussed with patient. Appropriate education, counseling and referral will be made based upon the above.   Behavior changes: he sees Dr. Ave Filter a psychiatrist, he does not handle transitions well.  He also does not get along with another resident. [name] has episodes of anger - raises his fist in the air, spits at  the caregivers and throws his walker. Episodes have not as frequent lately. Very seldom now.   Chronic constipation: he is on Amitiza and also prune juice and seems to be controlling symptoms  Recurrent Falls: last fall 07/27/2015, and had to go to Urgent care. He had a bruised left ribs. He fell at work, caregiver unable to give details, no eye witness. No recent falls.    Chronic Pain: he always complains of back pain, wrist pain, but able to use walker and has lidoderm patches, caregiver states that today he seems to be doing okay.   History of spinal fracture and has osteoporosis: we have discussed therapy options in the past and that calcitonin is not very efficacious, but caregivers are afraid to change regiment at this time. He has GERD and we will hold off on biphosphonate  at this time  Protein calorie malnutrition: he continues to lose weight, we have check multiple labs in the past and we will add sed rate, caregiver states he is pick eater, advised her to feed him whatever he would like to eat to improve with weight gain  Hyperlipidemia: not on medication  BPH: difficulty communicating, caregiver states no bladder incontinence noticed.    Patient Active Problem List   Diagnosis Date Noted  . Recurrent falls 08/04/2015  . Dermatitis seborrheica 06/24/2015  . Chronic constipation 06/24/2015  . Lives in group home 06/24/2015  . Protein calorie malnutrition (HCC) 06/24/2015  . Hearing loss of both ears 06/24/2015  . Wears hearing aid 06/24/2015  . History of iron  deficiency anemia 06/24/2015  . Osteoporosis 06/24/2015  . Adult behavior problem 06/24/2015  . Hiatal hernia 06/24/2015  . Hemorrhoid 06/24/2015  . Gait instability 06/24/2015  . DDD (degenerative disc disease), lumbar 06/24/2015  . Nephrolithiasis 06/24/2015  . Hyperlipidemia 06/24/2015  . Compression fracture of L1 lumbar vertebra (HCC) 06/24/2015  . Family history of upper GI bleeding   . BPH with  obstruction/lower urinary tract symptoms 06/21/2015  . Chronic pain 06/19/2015  . Acid reflux 06/19/2015  . Intellectual disability 06/19/2015    Past Surgical History  Procedure Laterality Date  . Arm fracture      History reviewed. No pertinent family history.  Social History   Social History  . Marital Status: Single    Spouse Name: N/A  . Number of Children: N/A  . Years of Education: N/A   Occupational History  . Not on file.   Social History Main Topics  . Smoking status: Former Games developer  . Smokeless tobacco: Never Used  . Alcohol Use: No  . Drug Use: No  . Sexual Activity: Not Currently   Other Topics Concern  . Not on file   Social History Narrative     Current outpatient prescriptions:  .  acetaminophen (MAPAP) 500 MG tablet, Take 2 tablets (1,000 mg total) by mouth every 8 (eight) hours as needed for moderate pain., Disp: 270 tablet, Rfl: 3 .  ALPRAZolam (XANAX) 0.5 MG tablet, Take 1 tablet (0.5 mg total) by mouth at bedtime as needed for anxiety., Disp: 90 tablet, Rfl: 2 .  aspirin (ASPIR-LOW) 81 MG EC tablet, Take 1 tablet (81 mg total) by mouth daily., Disp: 30 tablet, Rfl: 12 .  calcitonin, salmon, (MIACALCIN/FORTICAL) 200 UNIT/ACT nasal spray, Place into the nose., Disp: , Rfl:  .  Calcium Carbonate-Vitamin D 600-400 MG-UNIT per tablet, Take by mouth., Disp: , Rfl:  .  carboxymethylcellulose (REFRESH PLUS) 0.5 % SOLN, 1 drop 3 (three) times daily as needed., Disp: , Rfl:  .  diclofenac (VOLTAREN) 75 MG EC tablet, Take 75 mg by mouth., Disp: , Rfl:  .  DULoxetine (CYMBALTA) 30 MG capsule, Take 90 mg by mouth daily., Disp: , Rfl:  .  esomeprazole (NEXIUM) 40 MG packet, Take 40 mg by mouth daily before breakfast., Disp: 30 each, Rfl: 12 .  gabapentin (NEURONTIN) 100 MG capsule, Take 1 capsule (100 mg total) by mouth 3 (three) times daily., Disp: 90 capsule, Rfl: 12 .  ibuprofen (ADVIL,MOTRIN) 800 MG tablet, Take 800 mg by mouth every 8 (eight) hours as  needed., Disp: , Rfl:  .  ketoconazole (NIZORAL) 2 % shampoo, SHAMPOO HAIR 3 TIMES WEEKLY FOR FUNGAL INFECTION, Disp: 120 mL, Rfl: 5 .  lidocaine (LIDODERM) 5 %, APPLY 2 PATCHES TOPICALLY, LEAVE ON 12 HOURS, OFF 12 HOURS, Disp: 60 patch, Rfl: 5 .  lubiprostone (AMITIZA) 24 MCG capsule, Take 1 capsule (24 mcg total) by mouth 2 (two) times daily with a meal., Disp: 60 capsule, Rfl: 12 .  mometasone (ELOCON) 0.1 % lotion, Apply topically daily., Disp: , Rfl:  .  orphenadrine (NORFLEX) 100 MG tablet, TAKE 1 TABLET BY MOUTH TWICE DAILY FOR MUSCLE RELAXANT, Disp: 60 tablet, Rfl: 5 .  QUEtiapine (SEROQUEL) 100 MG tablet, Take by mouth., Disp: , Rfl:  .  REFRESH 1 % ophthalmic solution, INSTILL 1 DROP INTO EACH EYE 3 TIMES DAILY FOR 30 DAYS., Disp: 15 mL, Rfl: 12 .  tamsulosin (FLOMAX) 0.4 MG CAPS capsule, Take 1 capsule (0.4 mg total) by mouth daily., Disp:  30 capsule, Rfl: 12 .  traZODone (DESYREL) 100 MG tablet, Take 1 tablet (100 mg total) by mouth at bedtime., Disp: 30 tablet, Rfl: 12  No Known Allergies   ROS  Constitutional: Negative for fever, positive for  weight change.  Respiratory: Negative for cough and shortness of breath.   Cardiovascular: Negative for chest pain or palpitations.  Gastrointestinal: Negative for abdominal pain, no bowel changes.  Musculoskeletal: Positive for gait problem no  joint swelling.  Skin: Positive  for rash - using medication for seborrhea.  Neurological: Negative for dizziness or headache.  No other specific complaints in a complete review of systems (except as listed in HPI above). Objective  Filed Vitals:   02/22/16 1118  BP: 122/76  Pulse: 94  Temp: 97.9 F (36.6 C)  TempSrc: Oral  Resp: 16  Weight: 128 lb 9.6 oz (58.333 kg)  SpO2: 96%    Body mass index is 20.77 kg/(m^2).  Physical Exam  Constitutional: Patient appears well-developed and thin . No distress.  HENT: Head: Normocephalic and atraumatic.Nose: Nose normal. Mouth/Throat:  Oropharynx is clear and moist. No oropharyngeal exudate.  Eyes: Conjunctivae and EOM are normal. Pupils are equal, round, and reactive to light. No scleral icterus.  Neck: Normal range of motion. Neck supple. No JVD present. No thyromegaly present.  Cardiovascular: Normal rate, regular rhythm and normal heart sounds.  No murmur heard. No BLE edema. Pulmonary/Chest: Effort normal and breath sounds normal. No respiratory distress. Abdominal: Soft. Bowel sounds are normal, no distension. There is no tenderness. no masses MALE GENITALIA: Not done RECTAL: Prostate normal size and consistency, no rectal masses or hemorrhoids Musculoskeletal: Normal range of motion, no joint effusions. Lumbar scoliosis. Lidoderm patches on his back Neurological: he is alert . No cranial nerve deficit. Slow gait, uses walker, poor balance, no tremors Skin: Skin is warm and dry. Seborrhea on his face Psychiatric: Patient has a normal mood and affect.  PHQ2/9: Depression screen [facility] 2/9 02/22/2016 06/24/2015  Decreased Interest 0 0  Down, Depressed, Hopeless 0 0  PHQ - 2 Score 0 0     Fall Risk: Fall Risk  02/22/2016 08/04/2015 06/24/2015  Falls in the past year? No Yes No  Number falls in past yr: - 2 or more -  Injury with Fall? - Yes -  Risk Factor Category  - High Fall Risk -  Risk for fall due to : - History of fall(s);Impaired balance/gait;Impaired mobility -  Follow up - Falls prevention discussed -     Functional Status Survey: Is the patient deaf or have difficulty hearing?: No Does the patient have difficulty seeing, even when wearing glasses/contacts?: No Does the patient have difficulty concentrating, remembering, or making decisions?: No Does the patient have difficulty walking or climbing stairs?: Yes (uses walker) Does the patient have difficulty dressing or bathing?: No Does the patient have difficulty doing errands alone such as visiting a doctor's office or shopping?: No    Assessment &  Plan  1. Medicare annual wellness visit, subsequent  Discussed importance of 150 minutes of physical activity weekly, eat two servings of fish weekly, eat one serving of tree nuts ( cashews, pistachios, pecans, almonds.Marland Kitchen.) every other day, eat 6 servings of fruit/vegetables daily and drink plenty of water and avoid sweet beverages.   2. Chronic constipation  Continue medication   3. Gait instability  Continue walker, no recent falls  4. Lives in group home  stable  5. Protein calorie malnutrition (HCC)  - Comprehensive metabolic panel -  VITAMIN D 25 Hydroxy (Vit-D Deficiency, Fractures) - Vitamin B12  6. Chronic pain  Continue medication, stable  7. History of iron deficiency anemia  - CBC with Differential/Platelet - Ferritin  8. Wears hearing aid   9. Hyperlipidemia  - Lipid panel  10. Intellectual disability   11. Hearing loss of both ears   12. Compression fracture of L1 lumbar vertebra, with routine healing, subsequent encounter  - VITAMIN D 25 Hydroxy (Vit-D Deficiency, Fractures)  13. Encounter for long-term (current) use of high-risk medication  - TSH  14. Osteoporosis  - VITAMIN D 25 Hydroxy (Vit-D Deficiency, Fractures)

## 2016-02-23 DIAGNOSIS — E785 Hyperlipidemia, unspecified: Secondary | ICD-10-CM | POA: Diagnosis not present

## 2016-02-23 DIAGNOSIS — Z79899 Other long term (current) drug therapy: Secondary | ICD-10-CM | POA: Diagnosis not present

## 2016-02-23 DIAGNOSIS — M810 Age-related osteoporosis without current pathological fracture: Secondary | ICD-10-CM | POA: Diagnosis not present

## 2016-02-23 DIAGNOSIS — G8929 Other chronic pain: Secondary | ICD-10-CM | POA: Diagnosis not present

## 2016-02-23 DIAGNOSIS — E46 Unspecified protein-calorie malnutrition: Secondary | ICD-10-CM | POA: Diagnosis not present

## 2016-02-23 DIAGNOSIS — Z862 Personal history of diseases of the blood and blood-forming organs and certain disorders involving the immune mechanism: Secondary | ICD-10-CM | POA: Diagnosis not present

## 2016-02-23 DIAGNOSIS — S32010D Wedge compression fracture of first lumbar vertebra, subsequent encounter for fracture with routine healing: Secondary | ICD-10-CM | POA: Diagnosis not present

## 2016-02-24 LAB — VITAMIN D 25 HYDROXY (VIT D DEFICIENCY, FRACTURES): Vit D, 25-Hydroxy: 15.7 ng/mL — ABNORMAL LOW (ref 30.0–100.0)

## 2016-02-24 LAB — COMPREHENSIVE METABOLIC PANEL
ALT: 12 IU/L (ref 0–44)
AST: 17 IU/L (ref 0–40)
Albumin/Globulin Ratio: 2 (ref 1.2–2.2)
Albumin: 4.3 g/dL (ref 3.6–4.8)
Alkaline Phosphatase: 69 [IU]/L (ref 39–117)
BUN/Creatinine Ratio: 27 — ABNORMAL HIGH (ref 10–24)
BUN: 24 mg/dL (ref 8–27)
Bilirubin Total: 0.5 mg/dL (ref 0.0–1.2)
CO2: 26 mmol/L (ref 18–29)
Calcium: 9.1 mg/dL (ref 8.6–10.2)
Chloride: 100 mmol/L (ref 96–106)
Creatinine, Ser: 0.9 mg/dL (ref 0.76–1.27)
GFR calc Af Amer: 101 mL/min/{1.73_m2} (ref >59)
GFR calc non Af Amer: 87 mL/min/{1.73_m2} (ref >59)
Globulin, Total: 2.2 g/dL (ref 1.5–4.5)
Glucose: 91 mg/dL (ref 65–99)
Potassium: 4.4 mmol/L (ref 3.5–5.2)
Sodium: 141 mmol/L (ref 134–144)
Total Protein: 6.5 g/dL (ref 6.0–8.5)

## 2016-02-24 LAB — CBC WITH DIFFERENTIAL/PLATELET
Basophils Absolute: 0.1 10*3/uL (ref 0.0–0.2)
Basos: 1 %
EOS (ABSOLUTE): 0.3 10*3/uL (ref 0.0–0.4)
Eos: 4 %
Hematocrit: 37.5 % (ref 37.5–51.0)
Hemoglobin: 12.5 g/dL — ABNORMAL LOW (ref 12.6–17.7)
Immature Grans (Abs): 0 10*3/uL (ref 0.0–0.1)
Immature Granulocytes: 0 %
Lymphocytes Absolute: 2 10*3/uL (ref 0.7–3.1)
Lymphs: 31 %
MCH: 31 pg (ref 26.6–33.0)
MCHC: 33.3 g/dL (ref 31.5–35.7)
MCV: 93 fL (ref 79–97)
Monocytes Absolute: 0.7 10*3/uL (ref 0.1–0.9)
Monocytes: 12 %
Neutrophils Absolute: 3.3 10*3/uL (ref 1.4–7.0)
Neutrophils: 52 %
Platelets: 316 10*3/uL (ref 150–379)
RBC: 4.03 x10E6/uL — ABNORMAL LOW (ref 4.14–5.80)
RDW: 12.8 % (ref 12.3–15.4)
WBC: 6.3 10*3/uL (ref 3.4–10.8)

## 2016-02-24 LAB — FERRITIN: Ferritin: 34 ng/mL (ref 30–400)

## 2016-02-24 LAB — TSH: TSH: 1.2 u[IU]/mL (ref 0.450–4.500)

## 2016-02-24 LAB — LIPID PANEL
Chol/HDL Ratio: 2.7 ratio units (ref 0.0–5.0)
Cholesterol, Total: 171 mg/dL (ref 100–199)
HDL: 64 mg/dL (ref >39)
LDL Calculated: 91 mg/dL (ref 0–99)
Triglycerides: 79 mg/dL (ref 0–149)
VLDL Cholesterol Cal: 16 mg/dL (ref 5–40)

## 2016-02-24 LAB — VITAMIN B12: Vitamin B-12: 420 pg/mL (ref 211–946)

## 2016-02-26 ENCOUNTER — Other Ambulatory Visit: Payer: Self-pay | Admitting: Family Medicine

## 2016-02-26 MED ORDER — VITAMIN D (ERGOCALCIFEROL) 1.25 MG (50000 UNIT) PO CAPS: 50000.0000 [IU] | ORAL_CAPSULE | ORAL | Status: DC

## 2016-03-06 DIAGNOSIS — F419 Anxiety disorder, unspecified: Secondary | ICD-10-CM | POA: Diagnosis not present

## 2016-04-16 ENCOUNTER — Other Ambulatory Visit: Payer: Self-pay | Admitting: Family Medicine

## 2016-06-15 DIAGNOSIS — H6123 Impacted cerumen, bilateral: Secondary | ICD-10-CM | POA: Diagnosis not present

## 2016-06-15 DIAGNOSIS — H9203 Otalgia, bilateral: Secondary | ICD-10-CM | POA: Diagnosis not present

## 2016-06-15 DIAGNOSIS — H903 Sensorineural hearing loss, bilateral: Secondary | ICD-10-CM | POA: Diagnosis not present

## 2016-06-19 DIAGNOSIS — F419 Anxiety disorder, unspecified: Secondary | ICD-10-CM | POA: Diagnosis not present

## 2016-06-20 ENCOUNTER — Encounter: Payer: Self-pay | Admitting: Urology

## 2016-06-20 ENCOUNTER — Ambulatory Visit (INDEPENDENT_AMBULATORY_CARE_PROVIDER_SITE_OTHER): Payer: Medicare Other | Admitting: Urology

## 2016-06-20 VITALS — BP 92/55 | HR 83 | Ht 66.0 in | Wt 122.0 lb

## 2016-06-20 DIAGNOSIS — N400 Enlarged prostate without lower urinary tract symptoms: Secondary | ICD-10-CM | POA: Diagnosis not present

## 2016-06-20 DIAGNOSIS — N138 Other obstructive and reflux uropathy: Secondary | ICD-10-CM

## 2016-06-20 DIAGNOSIS — R3915 Urgency of urination: Secondary | ICD-10-CM | POA: Diagnosis not present

## 2016-06-20 DIAGNOSIS — N401 Enlarged prostate with lower urinary tract symptoms: Secondary | ICD-10-CM

## 2016-06-20 LAB — BLADDER SCAN AMB NON-IMAGING: Scan Result: 40

## 2016-06-20 NOTE — Patient Instructions (Addendum)
Fesoterodine extended-release tablets  What is this medicine?  FESOTERODINE (fes oh TER oh deen) is used to treat overactive bladder. This medicine reduces the amount of bathroom visits.  This medicine may be used for other purposes; ask your health care provider or pharmacist if you have questions.  What should I tell my health care provider before I take this medicine?  They need to know if you have any of these conditions:  -difficulty passing urine  -glaucoma  -intestinal obstruction  -kidney disease  -liver disease  -an unusual or allergic reaction to fesoterodine, tolterodine, other medicines, foods, dyes, or preservatives  -pregnant or trying to get pregnant  -breast-feeding  How should I use this medicine?  Take this medicine by mouth with a glass of water. Follow the directions on the prescription label. Do not cut, crush or chew this medicine. Take your doses at regular intervals. Do not take your medicine more often than directed.  Talk to your pediatrician regarding the use of this medicine in children. Special care may be needed.  Overdosage: If you think you have taken too much of this medicine contact a poison control center or emergency room at once.  NOTE: This medicine is only for you. Do not share this medicine with others.  What if I miss a dose?  If you miss a dose, take it as soon as you can. If it is almost time for your next dose, take only that dose. Do not take double or extra doses.  What may interact with this medicine?  -antihistamines for allergy, cough and cold  -atropine  -certain medicines for bladder problems like oxybutynin, tolterodine  -certain medicines for Parkinson's disease like benztropine, trihexyphenidyl  -certain medicines for stomach problems like dicyclomine, hyoscyamine  -certain medicines for travel sickness like scopolamine  -clarithromycin  -ipratropium  -itraconazole  -ketoconazole  -rifampin  This list may not describe all possible interactions. Give your health  care provider a list of all the medicines, herbs, non-prescription drugs, or dietary supplements you use. Also tell them if you smoke, drink alcohol, or use illegal drugs. Some items may interact with your medicine.  What should I watch for while using this medicine?  It may take 2 or 3 months to notice the full benefit from this medicine. Your health care professional may also recommend techniques that may help improve control of your bladder and sphincter muscles. These techniques will help you need the bathroom less frequently.  You may need to limit your intake of tea, coffee, caffeinated sodas, and alcohol. These drinks may make your symptoms worse. Keeping healthy bowel habits may lessen bladder symptoms. If you currently smoke, quitting smoking may help reduce irritation to the bladder muscle.  You may get drowsy or dizzy. Do not drive, use machinery, or do anything that needs mental alertness until you know how this drug affects you. Do not stand or sit up quickly, especially if you are an older patient. This reduces the risk of dizzy or fainting spells.  Your mouth may get dry. Chewing sugarless gum or sucking hard candy and drinking plenty of water will help.  This medicine may cause dry eyes and blurred vision. If you wear contact lenses you may feel some discomfort. Lubricating drops may help. See your eye doctor if the problem does not go away or is severe.  What side effects may I notice from receiving this medicine?  Side effects that you should report to your doctor or health care professional as   soon as possible:  -allergic reactions like skin rash, itching or hives, swelling of the face, lips, or tongue  -breathing problems  -chest pain  -fast, irregular heartbeat  -fever  -swelling of the ankles, feet, hands  -trouble passing urine or change in the amount of urine  Side effects that usually do not require medical attention (report to your doctor or health care professional if they continue or are  bothersome):  -changes in vision  -constipation  -dizziness  -dry eyes or mouth  -nausea  -stomach upset  -tiredness  This list may not describe all possible side effects. Call your doctor for medical advice about side effects. You may report side effects to FDA at 1-800-FDA-1088.  Where should I keep my medicine?  Keep out of the reach of children.  Store at room temperature between 15 and 30 degrees C (59 and 86 degrees F). Protect from moisture. Throw away any unused medicine after the expiration date.  NOTE: This sheet is a summary. It may not cover all possible information. If you have questions about this medicine, talk to your doctor, pharmacist, or health care provider.     © 2016, [employer]. (2009-12-21 12:25:12)

## 2016-06-20 NOTE — Progress Notes (Signed)
06/20/2016 10:58 AM   Aaron GuadalajaraJoseph J Foster [birth year] [id number]  Referring provider: Alba CoryKrichna Sowles, MD [address] [place], [place] [id number]  Chief Complaint  Patient presents with  . Benign Prostatic Hypertrophy    1 year follow up    HPI: Mr. Aaron ShiversMartiner a 70 year old Caucasian male with BPH and LUTS who presents today for yearly follow up.  His IPSS score today is 12, which is moderate lower urinary tract symptomatology. His previous IPSS score was 2/2.  His PVR is 40 mL.  [name] mostly dissatisfied with his quality life due to his urinary symptoms.   His main complaint is urinary urgency.  His previous PVR was 46 mL.    He denies any dysuria, hematuria or suprapubic pain.   He currently taking tamsulosin.     He also denies any recent fevers, chills, nausea or vomiting.  His family history is unknown.       IPSS    Row Name 06/20/16 1000         International Prostate Symptom Score   How often have you had the sensation of not emptying your bladder? Not at All     How often have you had to urinate less than every two hours? Less than half the time     How often have you found you stopped and started again several times when you urinated? Not at All     How often have you found it difficult to postpone urination? Almost always     How often have you had a weak urinary stream? Less than 1 in 5 times     How often have you had to strain to start urination? Not at All     How many times did you typically get up at night to urinate? 4 Times     Total IPSS Score 12       Quality of Life due to urinary symptoms   If you were to spend the rest of your life with your urinary condition just the way it is now how would you feel about that? Mostly Disatisfied        Score:  1-7 Mild 8-19 Moderate 20-35 Severe     PMH: Past Medical History:  Diagnosis Date  . Anemia   . Bilateral renal cysts   . BPH (benign prostatic hyperplasia)   . Cannot hear   .  Chronic back pain   . Constipation   . Esophageal reflux   . Hemorrhoid   . HLD (hyperlipidemia)   . Insomnia   . Nephrolithiasis   . Osteoporosis   . Phimosis   . Seborrheic dermatitis     Surgical History: Past Surgical History:  Procedure Laterality Date  . arm fracture    . circumscision      Home Medications:    Medication List       Accurate as of 06/20/16 10:58 AM. Always use your most recent med list.          acetaminophen 500 MG tablet Commonly known as:  MAPAP Take 2 tablets (1,000 mg total) by mouth every 8 (eight) hours as needed for moderate pain.   ALPRAZolam 0.5 MG tablet Commonly known as:  XANAX Take 1 tablet (0.5 mg total) by mouth at bedtime as needed for anxiety.   aspirin 81 MG EC tablet Commonly known as:  ASPIR-LOW Take 1 tablet (81 mg total) by mouth daily.   calcitonin (salmon) 200 UNIT/ACT nasal  spray Commonly known as:  MIACALCIN/FORTICAL Place into the nose.   Calcium Carbonate-Vitamin D 600-400 MG-UNIT tablet Take by mouth.   Calcium Carbonate-Vitamin D3 600-400 MG-UNIT Tabs Take by mouth.   carboxymethylcellulose 0.5 % Soln Commonly known as:  REFRESH PLUS 1 drop 3 (three) times daily as needed.   REFRESH 1 % ophthalmic solution Generic drug:  carboxymethylcellulose INSTILL 1 DROP INTO EACH EYE 3 TIMES DAILY FOR 30 DAYS.   diclofenac 75 MG EC tablet Commonly known as:  VOLTAREN Take 75 mg by mouth.   DULoxetine 30 MG capsule Commonly known as:  CYMBALTA Take 90 mg by mouth daily.   esomeprazole 40 MG packet Commonly known as:  NEXIUM Take 40 mg by mouth daily before breakfast.   gabapentin 100 MG capsule Commonly known as:  NEURONTIN Take 1 capsule (100 mg total) by mouth 3 (three) times daily.   ibuprofen 800 MG tablet Commonly known as:  ADVIL,MOTRIN Take 800 mg by mouth every 8 (eight) hours as needed.   ketoconazole 2 % shampoo Commonly known as:  NIZORAL SHAMPOO HAIR 3 TIMES WEEKLY FOR FUNGAL  INFECTION   lidocaine 5 % Commonly known as:  LIDODERM Place onto the skin.   lidocaine 5 % Commonly known as:  LIDODERM APPLY 2 PATCHES TOPICALLY, LEAVE ON 12 HOURS, OFF 12 HOURS   LORazepam 1 MG tablet Commonly known as:  ATIVAN Take 1 mg by mouth.   lubiprostone 24 MCG capsule Commonly known as:  AMITIZA Take 1 capsule (24 mcg total) by mouth 2 (two) times daily with a meal.   mometasone 0.1 % lotion Commonly known as:  ELOCON Apply topically daily.   orphenadrine 100 MG tablet Commonly known as:  NORFLEX TAKE 1 TABLET BY MOUTH TWICE DAILY FOR MUSCLE RELAXANT   SEROQUEL 100 MG tablet Generic drug:  QUEtiapine Take by mouth.   tamsulosin 0.4 MG Caps capsule Commonly known as:  FLOMAX Take 1 capsule (0.4 mg total) by mouth daily.   traZODone 100 MG tablet Commonly known as:  DESYREL Take 1 tablet (100 mg total) by mouth at bedtime.   Vitamin D (Ergocalciferol) 50000 units Caps capsule Commonly known as:  DRISDOL TAKE 1 CAPSULE BY MOUTH ONCE WEEKLY FOR SUPPLEMENT       Allergies: No Known Allergies  Family History: Family History  Problem Relation Age of Onset  . Kidney disease Neg Hx     Social History:  reports that he has quit smoking. He has never used smokeless tobacco. He reports that he does not drink alcohol or use drugs.  ROS: UROLOGY Frequent Urination?: No Hard to postpone urination?: No Burning/pain with urination?: No Get up at night to urinate?: Yes Leakage of urine?: No Urine stream starts and stops?: No Trouble starting stream?: No Do you have to strain to urinate?: No Blood in urine?: No Urinary tract infection?: No Sexually transmitted disease?: No Injury to kidneys or bladder?: No Painful intercourse?: No Weak stream?: No Erection problems?: No Penile pain?: No  Gastrointestinal Nausea?: No Vomiting?: No Indigestion/heartburn?: No Diarrhea?: No Constipation?: No  Constitutional Fever: No Night sweats?: No Weight  loss?: Yes Fatigue?: No  Skin Skin rash/lesions?: No Itching?: No  Eyes Blurred vision?: No Double vision?: No  Ears/Nose/Throat Sore throat?: No Sinus problems?: No  Hematologic/Lymphatic Swollen glands?: No Easy bruising?: No  Cardiovascular Leg swelling?: No Chest pain?: No  Respiratory Cough?: No Shortness of breath?: No  Endocrine Excessive thirst?: No  Musculoskeletal Back pain?: Yes Joint pain?: No  Neurological Headaches?: No Dizziness?: No  Psychologic Depression?: No Anxiety?: No  Physical Exam: BP (!) 92/55   Pulse 83   Ht 5\' 6"  (1.676 m)   Wt 122 lb (55.3 kg)   BMI 19.69 kg/m   GU: Patient with circumcised phallus.   Urethral meatus is patent.  No penile discharge. No penile lesions or rashes. Scrotum without lesions, cysts, rashes and/or edema.  Testicles are located scrotally bilaterally. No masses are appreciated in the testicles. Left and right epididymis are normal. Rectal: Patient with  normal sphincter tone. Perineum without scarring or rashes. No rectal masses are appreciated. Prostate is approximately 50 grams, no nodules are appreciated. Seminal vesicles are normal.   Laboratory Data: Lab Results  Component Value Date   WBC 6.3 02/23/2016   HGB 12.1 (L) 06/23/2013   HCT 37.5 02/23/2016   MCV 93 02/23/2016   PLT 316 02/23/2016    Lab Results  Component Value Date   CREATININE 0.90 02/23/2016     Results for orders placed or performed in visit on 06/20/16  Bladder Scan (Post Void Residual) in office  Result Value Ref Range   Scan Result 40     Previous PSA's:      0.3 ng/mL on 05/11/2014      0.3 ng/mL on 06/21/2015    Assessment & Plan:    1. BPH (benign prostatic hyperplasia) with LUTS:     Patient's IPSS score is 12/4.  His PVR 40 mL.  His DRE demonstrates enlargement.  He will continue the tamsulosin and refill was sent to his pharmacy.   He will follow up in 12 months for a PSA, DRE, PVR and an IPSS.    -  PSA  2. Urgency  - mentally limited so he is not a candidate for behavioral therapies; bladder training, bladder control strategies, pelvic floor muscle training and fluid management   - offered medical therapy with anticholinergic therapy or beta-3 andrenoceptor agonist and the potential side effects of each therapy   - would like to try anticholinergic therapy.  Given Toviaz 8mg  samples, # 28.   Advised of the side effects, such as: Dry eyes, dry mouth, constipation, mental confusion and/or urinary retention.   - RTC in 3 weeks for PVR and symptom recheck   - BLADDER SCAN AMB NON-IMAGING   Return in about 3 weeks (around 07/11/2016) for PVR and IPSS.  Michiel Cowboy, PA-C  [facility] [address] [place], [place] [address] [phone]

## 2016-06-21 LAB — PSA: Prostate Specific Ag, Serum: 0.2 ng/mL (ref 0.0–4.0)

## 2016-06-22 ENCOUNTER — Telehealth: Payer: Self-pay

## 2016-06-22 NOTE — Telephone Encounter (Signed)
Spoke with receptionist at pt facility. Made aware of lab results. Karma GreaserLady stated she would make caregiver aware.

## 2016-06-22 NOTE — Telephone Encounter (Signed)
-----   Message from Harle BattiestShannon A McGowan, PA-C sent at 06/21/2016  8:26 AM EDT ----- Please notify the patient's caregivers that his PSA is stable.

## 2016-07-13 ENCOUNTER — Other Ambulatory Visit: Payer: Self-pay | Admitting: Urology

## 2016-07-13 DIAGNOSIS — N400 Enlarged prostate without lower urinary tract symptoms: Secondary | ICD-10-CM

## 2016-07-16 ENCOUNTER — Ambulatory Visit: Payer: Medicare Other | Admitting: Urology

## 2016-07-31 ENCOUNTER — Other Ambulatory Visit: Payer: Self-pay | Admitting: Family Medicine

## 2016-07-31 NOTE — Telephone Encounter (Signed)
Patient requesting refill of GNP Artifcial Tears to [facility].

## 2016-08-14 ENCOUNTER — Other Ambulatory Visit: Payer: Self-pay

## 2016-08-14 MED ORDER — ALPRAZOLAM 0.5 MG PO TABS: 0.5000 mg | ORAL_TABLET | Freq: Every evening | ORAL | 2 refills | Status: DC | PRN

## 2016-08-20 ENCOUNTER — Telehealth: Payer: Self-pay | Admitting: Family Medicine

## 2016-08-20 NOTE — Telephone Encounter (Signed)
We can lavage his ear here, but if unable to wait, please send referral

## 2016-08-20 NOTE — Telephone Encounter (Signed)
Patient see Dr Sheran SpineVault at [facility]. [name] requesting a referral due to ear pain (need cleaning out). Patient does have upcoming appt for 09-07-16

## 2016-08-21 NOTE — Telephone Encounter (Signed)
Spoke with Britta MccreedyBarbara (caretaker) and she will give us a call back

## 2016-08-22 NOTE — Telephone Encounter (Signed)
Aaron Foster returned call and due to you not having anything available she is asking that you please send referral to Dr Andee PolesVaught at [facility].

## 2016-08-23 ENCOUNTER — Other Ambulatory Visit: Payer: Self-pay | Admitting: Family Medicine

## 2016-08-23 DIAGNOSIS — H9203 Otalgia, bilateral: Secondary | ICD-10-CM

## 2016-08-23 DIAGNOSIS — Z8669 Personal history of other diseases of the nervous system and sense organs: Secondary | ICD-10-CM

## 2016-08-23 NOTE — Telephone Encounter (Signed)
done

## 2016-08-30 DIAGNOSIS — H9203 Otalgia, bilateral: Secondary | ICD-10-CM | POA: Diagnosis not present

## 2016-09-05 DIAGNOSIS — G8929 Other chronic pain: Secondary | ICD-10-CM | POA: Diagnosis not present

## 2016-09-05 DIAGNOSIS — K219 Gastro-esophageal reflux disease without esophagitis: Secondary | ICD-10-CM | POA: Diagnosis not present

## 2016-09-05 DIAGNOSIS — F819 Developmental disorder of scholastic skills, unspecified: Secondary | ICD-10-CM | POA: Diagnosis not present

## 2016-09-05 DIAGNOSIS — Z01818 Encounter for other preprocedural examination: Secondary | ICD-10-CM | POA: Diagnosis not present

## 2016-09-07 ENCOUNTER — Encounter: Payer: Self-pay | Admitting: Family Medicine

## 2016-09-07 ENCOUNTER — Ambulatory Visit (INDEPENDENT_AMBULATORY_CARE_PROVIDER_SITE_OTHER): Payer: Medicare Other | Admitting: Family Medicine

## 2016-09-07 VITALS — BP 124/76 | HR 89 | Temp 98.3°F | Resp 16 | Ht 66.0 in | Wt 121.6 lb

## 2016-09-07 DIAGNOSIS — G894 Chronic pain syndrome: Secondary | ICD-10-CM

## 2016-09-07 DIAGNOSIS — Z789 Other specified health status: Secondary | ICD-10-CM

## 2016-09-07 DIAGNOSIS — S32010D Wedge compression fracture of first lumbar vertebra, subsequent encounter for fracture with routine healing: Secondary | ICD-10-CM | POA: Diagnosis not present

## 2016-09-07 DIAGNOSIS — Z23 Encounter for immunization: Secondary | ICD-10-CM

## 2016-09-07 DIAGNOSIS — E7800 Pure hypercholesterolemia, unspecified: Secondary | ICD-10-CM

## 2016-09-07 DIAGNOSIS — E559 Vitamin D deficiency, unspecified: Secondary | ICD-10-CM

## 2016-09-07 DIAGNOSIS — K5909 Other constipation: Secondary | ICD-10-CM | POA: Diagnosis not present

## 2016-09-07 DIAGNOSIS — Z593 Problems related to living in residential institution: Secondary | ICD-10-CM | POA: Diagnosis not present

## 2016-09-07 DIAGNOSIS — E441 Mild protein-calorie malnutrition: Secondary | ICD-10-CM

## 2016-09-07 MED ORDER — VITAMIN D (ERGOCALCIFEROL) 1.25 MG (50000 UNIT) PO CAPS: ORAL_CAPSULE | ORAL | 1 refills | Status: DC

## 2016-09-07 NOTE — Progress Notes (Signed)
Name: Aaron Foster   MRN: [id number]    DOB: Jul 06, 1946   Date:09/07/2016       Progress Note  Subjective  Chief Complaint  Chief Complaint  Patient presents with  . chronic constipation    follow up visit    HPI   MR: he sees Aaron Foster a psychiatrist, he does not handle transitions well, but he has been doing well lately  He also does not get along with another resident. Aaron Foster has episodes of anger - raises his fist in the air, spits at the caregivers and throws his walker. Episodes have not as frequent lately. [name] doing much better now, very seldom has those behaviors. [name] here with Aaron Foster ( from [employer] home)  Chronic constipation: he is on Amitiza and also prune juice and seems to be controlling symptoms  Recurrent Falls: he has been doing well, he has fallen twice since last visit, but no injuries from the fall  Chronic Pain: he always complains of back pain, wrist pain, but able to use walker, no longer using lidoderm patch because it is not covered by his insurance,  History of spinal fracture and has osteoporosis: we have discussed therapy options in the past and that calcitonin is not very efficacious, but caregivers are afraid to change regiment at this time. He has GERD and we will hold off on biphosphonate  at this time  Protein calorie malnutrition: weight is finally stable, we have check multiple labs in the past and we will add sed rate, caregiver states he is pick eater, he is eating better now, but still does not like healthy meals  Hyperlipidemia: not on medication, reviewed labs done in April and it was at goal   BPH: difficulty communicating, caregiver states no bladder incontinence noticed.  Seen by Urologist , last PSA was normal  Patient Active Problem List   Diagnosis Date Noted  . Scoliosis 02/22/2016  . Recurrent falls 08/04/2015  . Dermatitis seborrheica 06/24/2015  . Chronic constipation 06/24/2015  . Lives in group home  06/24/2015  . Protein calorie malnutrition (HCC) 06/24/2015  . Hearing loss of both ears 06/24/2015  . Wears hearing aid 06/24/2015  . History of iron deficiency anemia 06/24/2015  . Osteoporosis 06/24/2015  . Adult behavior problem 06/24/2015  . Hiatal hernia 06/24/2015  . Hemorrhoid 06/24/2015  . Gait instability 06/24/2015  . DDD (degenerative disc disease), lumbar 06/24/2015  . Nephrolithiasis 06/24/2015  . Hyperlipidemia 06/24/2015  . Compression fracture of L1 lumbar vertebra (HCC) 06/24/2015  . Family history of upper GI bleeding   . BPH with obstruction/lower urinary tract symptoms 06/21/2015  . Chronic pain 06/19/2015  . Acid reflux 06/19/2015  . Intellectual disability 06/19/2015    Past Surgical History:  Procedure Laterality Date  . arm fracture    . circumscision      Family History  Problem Relation Age of Onset  . Kidney disease Neg Hx     Social History   Social History  . Marital status: Single    Spouse name: N/A  . Number of children: N/A  . Years of education: N/A   Occupational History  . Not on file.   Social History Main Topics  . Smoking status: Former Games developer  . Smokeless tobacco: Never Used  . Alcohol use No  . Drug use: No  . Sexual activity: Not Currently   Other Topics Concern  . Not on file   Social History Narrative  .  No narrative on file     Current Outpatient Prescriptions:  .  acetaminophen (MAPAP) 500 MG tablet, Take 2 tablets (1,000 mg total) by mouth every 8 (eight) hours as needed for moderate pain., Disp: 270 tablet, Rfl: 3 .  ALPRAZolam (XANAX) 0.5 MG tablet, Take 1 tablet (0.5 mg total) by mouth at bedtime as needed for anxiety., Disp: 90 tablet, Rfl: 2 .  aspirin (ASPIR-LOW) 81 MG EC tablet, Take 1 tablet (81 mg total) by mouth daily., Disp: 30 tablet, Rfl: 12 .  calcitonin, salmon, (MIACALCIN/FORTICAL) 200 UNIT/ACT nasal spray, Place into the nose., Disp: , Rfl:  .  Calcium Carbonate-Vitamin D3 600-400 MG-UNIT  TABS, Take by mouth., Disp: , Rfl:  .  carboxymethylcellulose (REFRESH PLUS) 0.5 % SOLN, 1 drop 3 (three) times daily as needed., Disp: , Rfl:  .  diclofenac (VOLTAREN) 75 MG EC tablet, Take 75 mg by mouth., Disp: , Rfl:  .  DULoxetine (CYMBALTA) 30 MG capsule, Take 90 mg by mouth daily., Disp: , Rfl:  .  esomeprazole (NEXIUM) 40 MG packet, Take 40 mg by mouth daily before breakfast., Disp: 30 each, Rfl: 12 .  gabapentin (NEURONTIN) 100 MG capsule, Take 1 capsule (100 mg total) by mouth 3 (three) times daily., Disp: 90 capsule, Rfl: 12 .  GNP ARTIFICIAL TEARS 5-6 MG/ML SOLN, INSTILL 1 DROP INTO EACH EYE 4 TIMES DAILY, Disp: 15 mL, Rfl: 5 .  ketoconazole (NIZORAL) 2 % shampoo, SHAMPOO HAIR 3 TIMES WEEKLY FOR FUNGAL INFECTION, Disp: 120 mL, Rfl: 5 .  LORazepam (ATIVAN) 1 MG tablet, Take 1 mg by mouth., Disp: , Rfl:  .  lubiprostone (AMITIZA) 24 MCG capsule, Take 1 capsule (24 mcg total) by mouth 2 (two) times daily with a meal., Disp: 60 capsule, Rfl: 12 .  mometasone (ELOCON) 0.1 % lotion, Apply topically daily., Disp: , Rfl:  .  orphenadrine (NORFLEX) 100 MG tablet, TAKE 1 TABLET BY MOUTH TWICE DAILY FOR MUSCLE RELAXANT, Disp: 60 tablet, Rfl: 5 .  QUEtiapine (SEROQUEL) 100 MG tablet, Take by mouth., Disp: , Rfl:  .  REFRESH 1 % ophthalmic solution, INSTILL 1 DROP INTO EACH EYE 3 TIMES DAILY FOR 30 DAYS., Disp: 15 mL, Rfl: 12 .  tamsulosin (FLOMAX) 0.4 MG CAPS capsule, TAKE 1 CAPSULE BY MOUTH DAILY, Disp: 30 capsule, Rfl: 3 .  traZODone (DESYREL) 100 MG tablet, Take 1 tablet (100 mg total) by mouth at bedtime., Disp: 30 tablet, Rfl: 12 .  Vitamin D, Ergocalciferol, (DRISDOL) 50000 units CAPS capsule, TAKE 1 CAPSULE BY MOUTH ONCE WEEKLY FOR SUPPLEMENT, Disp: 12 capsule, Rfl: 1  No Known Allergies   ROS  Constitutional: Negative for fever or weight change.  Respiratory: Negative for cough and shortness of breath.   Cardiovascular: Negative for chest pain or palpitations.  Gastrointestinal:  Negative for abdominal pain, no bowel changes.  Musculoskeletal: Positive  for gait problem no  joint swelling.  Skin: Negative for rash.  Neurological: Negative for dizziness or headache.  No other specific complaints in a complete review of systems (except as listed in HPI above).  Objective  Vitals:   09/07/16 1110  BP: 124/76  Pulse: 89  Resp: 16  Temp: 98.3 F (36.8 C)  TempSrc: Oral  SpO2: 98%  Weight: 121 lb 9 oz (55.1 kg)  Height: 5\' 6"  (1.676 m)    Body mass index is 19.62 kg/m.  Physical Exam  Constitutional: Patient appears well-developed  No distress.  Skin: seborrheic keratosis face, almost completely cleared HEENT: head  atraumatic, normocephalic,neck supple, throat within normal limits Cardiovascular: Normal rate, regular rhythm and normal heart sounds. No murmur heard. No BLE edema. Pulmonary/Chest: Effort normal and breath sounds normal. No respiratory distress. Abdominal: Soft. There is no tenderness. Normal bowel sounds Psychiatric: Patient has a normal mood and affect. Non-verbal  Muscular skeletal: slow gait, with walker assistance, brace on left wrist - " makes him feel better ", no pain during palpation of his back   Recent Results (from the past 2160 hour(s))  PSA     Status: None   Collection Time: 06/20/16 10:18 AM  Result Value Ref Foster   Prostate Specific Ag, Serum 0.2 0.0 - 4.0 ng/mL    Comment: Roche ECLIA methodology. According to the American Urological Association, Serum PSA should decrease and remain at undetectable levels after radical prostatectomy. The AUA defines biochemical recurrence as an initial PSA value 0.2 ng/mL or greater followed by a subsequent confirmatory PSA value 0.2 ng/mL or greater. Values obtained with different assay methods or kits cannot be used interchangeably. Results cannot be interpreted as absolute evidence of the presence or absence of malignant disease.   Bladder Scan (Post Void Residual) in office      Status: None   Collection Time: 06/20/16 10:51 AM  Result Value Ref Foster   Scan Result 40       PHQ2/9: [facility] 02/22/2016 06/24/2015  Decreased Interest 0 0  Down, Depressed, Hopeless 0 0  PHQ - 2 Score 0 0     Fall Risk: Fall Risk  02/22/2016 08/04/2015 06/24/2015  Falls in the past year? No Yes No  Number falls in past yr: - 2 or more -  Injury with Fall? - Yes -  Risk Factor Category  - High Fall Risk -  Risk for fall due to : - History of fall(s);Impaired balance/gait;Impaired mobility -  Follow up - Falls prevention discussed -     Functional Status Survey: Is the patient deaf or have difficulty hearing?: No Does the patient have difficulty seeing, even when wearing glasses/contacts?: No Does the patient have difficulty concentrating, remembering, or making decisions?: Yes Does the patient have difficulty walking or climbing stairs?: Yes Does the patient have difficulty dressing or bathing?: Yes Does the patient have difficulty doing errands alone such as visiting a doctor's office or shopping?: Yes    Assessment & Plan  1. Mild protein-calorie malnutrition (HCC)  We will recheck labs next visit   2. Need for influenza vaccination  - Flu vaccine HIGH DOSE PF (Fluzone High dose)  3. Lives in group home   4. Chronic constipation  Well controlled now  5. Chronic pain syndrome  Doing well today, does not seem to be in pain today  6. Pure hypercholesterolemia  Doing well with life style modification   7. Closed compression fracture of L1 lumbar vertebra with routine healing, subsequent encounter  On medication   8. Vitamin D deficiency  - Vitamin D, Ergocalciferol, (DRISDOL) 50000 units CAPS capsule; TAKE 1 CAPSULE BY MOUTH ONCE WEEKLY FOR SUPPLEMENT  Dispense: 12 capsule; Refill: 1

## 2016-09-19 ENCOUNTER — Other Ambulatory Visit: Payer: Self-pay | Admitting: Family Medicine

## 2016-09-19 NOTE — Telephone Encounter (Signed)
Patient requesting refill of GNP and Promod to [facility].

## 2016-10-04 DIAGNOSIS — K029 Dental caries, unspecified: Secondary | ICD-10-CM | POA: Diagnosis not present

## 2016-10-04 DIAGNOSIS — K219 Gastro-esophageal reflux disease without esophagitis: Secondary | ICD-10-CM | POA: Diagnosis not present

## 2016-10-04 DIAGNOSIS — F79 Unspecified intellectual disabilities: Secondary | ICD-10-CM | POA: Diagnosis not present

## 2016-10-04 DIAGNOSIS — R569 Unspecified convulsions: Secondary | ICD-10-CM | POA: Diagnosis not present

## 2016-10-04 DIAGNOSIS — G8929 Other chronic pain: Secondary | ICD-10-CM | POA: Diagnosis not present

## 2016-10-04 DIAGNOSIS — Z7982 Long term (current) use of aspirin: Secondary | ICD-10-CM | POA: Diagnosis not present

## 2016-10-04 DIAGNOSIS — M549 Dorsalgia, unspecified: Secondary | ICD-10-CM | POA: Diagnosis not present

## 2016-10-26 ENCOUNTER — Encounter: Payer: Self-pay | Admitting: Family Medicine

## 2016-10-26 ENCOUNTER — Ambulatory Visit (INDEPENDENT_AMBULATORY_CARE_PROVIDER_SITE_OTHER): Payer: Medicare Other | Admitting: Family Medicine

## 2016-10-26 ENCOUNTER — Telehealth: Payer: Self-pay | Admitting: Family Medicine

## 2016-10-26 VITALS — BP 110/60 | HR 83 | Temp 97.7°F | Resp 18 | Ht 66.0 in | Wt 124.2 lb

## 2016-10-26 DIAGNOSIS — M25561 Pain in right knee: Secondary | ICD-10-CM

## 2016-10-26 MED ORDER — MELOXICAM 15 MG PO TABS: 15.0000 mg | ORAL_TABLET | Freq: Every day | ORAL | 0 refills | Status: DC

## 2016-10-26 NOTE — Progress Notes (Signed)
Name: Aaron Foster   MRN: [id number]    DOB: Oct 23, 1946   Date:10/26/2016       Progress Note  Subjective  Chief Complaint  Chief Complaint  Patient presents with  . Knee Pain    right knee pain would like referral    HPI  Right knee pain; he lives in a group home, not very verbal, but per caregiver he has been complaining intermittent pain on right knee, no redness or swelling. He has a history of DDD and tends to complain of pain. It is not slowing him down. Still ambulating with a walker. Taking Tylenol for pain and Norflex for spasms. There is no change in appetite or behavior  He came in today with two caregivers: Lula OlszewskiBarbara and Fannie   Patient Active Problem List   Diagnosis Date Noted  . Scoliosis 02/22/2016  . Recurrent falls 08/04/2015  . Dermatitis seborrheica 06/24/2015  . Chronic constipation 06/24/2015  . Lives in group home 06/24/2015  . Protein calorie malnutrition (HCC) 06/24/2015  . Hearing loss of both ears 06/24/2015  . Wears hearing aid 06/24/2015  . History of iron deficiency anemia 06/24/2015  . Osteoporosis 06/24/2015  . Adult behavior problem 06/24/2015  . Hiatal hernia 06/24/2015  . Hemorrhoid 06/24/2015  . Gait instability 06/24/2015  . DDD (degenerative disc disease), lumbar 06/24/2015  . Nephrolithiasis 06/24/2015  . Hyperlipidemia 06/24/2015  . Compression fracture of L1 lumbar vertebra (HCC) 06/24/2015  . Family history of upper GI bleeding   . BPH with obstruction/lower urinary tract symptoms 06/21/2015  . Chronic pain 06/19/2015  . Acid reflux 06/19/2015  . Intellectual disability 06/19/2015    Past Surgical History:  Procedure Laterality Date  . arm fracture    . circumscision      Family History  Problem Relation Age of Onset  . Kidney disease Neg Hx     Social History   Social History  . Marital status: Single    Spouse name: N/A  . Number of children: N/A  . Years of education: N/A   Occupational History  . Not on  file.   Social History Main Topics  . Smoking status: Former Games developermoker  . Smokeless tobacco: Never Used  . Alcohol use No  . Drug use: No  . Sexual activity: Not Currently   Other Topics Concern  . Not on file   Social History Narrative  . No narrative on file     Current Outpatient Prescriptions:  .  acetaminophen (MAPAP) 500 MG tablet, Take 2 tablets (1,000 mg total) by mouth every 8 (eight) hours as needed for moderate pain., Disp: 270 tablet, Rfl: 3 .  ALPRAZolam (XANAX) 0.5 MG tablet, Take 1 tablet (0.5 mg total) by mouth at bedtime as needed for anxiety., Disp: 90 tablet, Rfl: 2 .  aspirin (ASPIR-LOW) 81 MG EC tablet, Take 1 tablet (81 mg total) by mouth daily., Disp: 30 tablet, Rfl: 12 .  calcitonin, salmon, (MIACALCIN/FORTICAL) 200 UNIT/ACT nasal spray, Place into the nose., Disp: , Rfl:  .  Calcium Carbonate-Vitamin D3 600-400 MG-UNIT TABS, Take by mouth., Disp: , Rfl:  .  carboxymethylcellulose (REFRESH PLUS) 0.5 % SOLN, 1 drop 3 (three) times daily as needed., Disp: , Rfl:  .  diclofenac (VOLTAREN) 75 MG EC tablet, Take 75 mg by mouth., Disp: , Rfl:  .  DULoxetine (CYMBALTA) 30 MG capsule, Take 90 mg by mouth daily., Disp: , Rfl:  .  esomeprazole (NEXIUM) 40 MG packet, Take 40 mg by mouth  daily before breakfast., Disp: 30 each, Rfl: 12 .  gabapentin (NEURONTIN) 100 MG capsule, Take 1 capsule (100 mg total) by mouth 3 (three) times daily., Disp: 90 capsule, Rfl: 12 .  GNP ARTIFICIAL TEARS 5-6 MG/ML SOLN, INSTILL 1 DROP INTO EACH EYE 4 TIMES DAILY, Disp: 15 mL, Rfl: 12 .  ketoconazole (NIZORAL) 2 % shampoo, SHAMPOO HAIR 3 TIMES WEEKLY FOR FUNGAL INFECTION, Disp: 120 mL, Rfl: 5 .  LORazepam (ATIVAN) 1 MG tablet, Take 1 mg by mouth., Disp: , Rfl:  .  lubiprostone (AMITIZA) 24 MCG capsule, Take 1 capsule (24 mcg total) by mouth 2 (two) times daily with a meal., Disp: 60 capsule, Rfl: 12 .  mometasone (ELOCON) 0.1 % lotion, Apply topically daily., Disp: , Rfl:  .  Nutritional  Supplements (PROMOD) LIQD, MIX 1 OZ (30 ML) WITH 1 OZ WATER, DRINK TWICE DAILY FOR PROTEIN SUPPLEMENT, Disp: 1892 mL, Rfl: 12 .  orphenadrine (NORFLEX) 100 MG tablet, TAKE 1 TABLET BY MOUTH TWICE DAILY FOR MUSCLE RELAXANT, Disp: 60 tablet, Rfl: 5 .  QUEtiapine (SEROQUEL) 100 MG tablet, Take by mouth., Disp: , Rfl:  .  REFRESH 1 % ophthalmic solution, INSTILL 1 DROP INTO EACH EYE 3 TIMES DAILY FOR 30 DAYS., Disp: 15 mL, Rfl: 12 .  tamsulosin (FLOMAX) 0.4 MG CAPS capsule, TAKE 1 CAPSULE BY MOUTH DAILY, Disp: 30 capsule, Rfl: 3 .  traZODone (DESYREL) 100 MG tablet, Take 1 tablet (100 mg total) by mouth at bedtime., Disp: 30 tablet, Rfl: 12 .  Vitamin D, Ergocalciferol, (DRISDOL) 50000 units CAPS capsule, TAKE 1 CAPSULE BY MOUTH ONCE WEEKLY FOR SUPPLEMENT, Disp: 12 capsule, Rfl: 1  No Known Allergies   ROS  Ten systems reviewed and is negative except as mentioned in HPI   Objective  Vitals:   10/26/16 1328  BP: 110/60  Pulse: 83  Resp: 18  Temp: 97.7 F (36.5 C)  TempSrc: Oral  SpO2: 97%  Weight: 124 lb 3.2 oz (56.3 kg)  Height: 5\' 6"  (1.676 m)    Body mass index is 20.05 kg/m.  Physical Exam  Constitutional: Patient appears well-developed and well-nourished.  No distress.  HEENT: head atraumatic, normocephalic, pupils equal and reactive to light,  neck supple, throat within normal limits Cardiovascular: Normal rate, regular rhythm and normal heart sounds.  No murmur heard. No BLE edema. Pulmonary/Chest: Effort normal and breath sounds normal. No respiratory distress. Abdominal: Soft.  There is no tenderness. Psychiatric: Patient has a normal mood and affect. behavior is normal. Judgment and thought content normal. Muscular Skeletal: he is wearing a gate belt, uses a walker, sitting down but leaning towards right side. He has tight ligaments and it was resistant to extension of both knees, no redness, swelling or increase in warmth.    PHQ2/9: Depression screen [facility] 2/9  02/22/2016 06/24/2015  Decreased Interest 0 0  Down, Depressed, Hopeless 0 0  PHQ - 2 Score 0 0     Fall Risk: Fall Risk  02/22/2016 08/04/2015 06/24/2015  Falls in the past year? No Yes No  Number falls in past yr: - 2 or more -  Injury with Fall? - Yes -  Risk Factor Category  - High Fall Risk -  Risk for fall due to : - History of fall(s);Impaired balance/gait;Impaired mobility -  Follow up - Falls prevention discussed -      Assessment & Plan  1. Acute pain of right knee  We will hold off on referral to Ortho, we will try  nsaid's for one month and home PT - at the group home, return if no improvement. Discussed possible side effects of medication and importance of taking it with food.  - meloxicam (MOBIC) 15 MG tablet; Take 1 tablet (15 mg total) by mouth daily.  Dispense: 30 tablet; Refill: 0

## 2016-10-26 NOTE — Telephone Encounter (Signed)
[facility] HAS CALLED AND SAID THAT THE PATIENT JUST GOT HIS MOTRIN REFILLED 600MG  2 DAYS AGO AND THEN ON HIS APPT TODAY 10-26-16 YOU WROTE ONE FOR HIM TO TAKE MOBIC 15MG . DO YOU WANT HIM TO TAKE BOTH AND IF NOT THE PLACE HE STAYS AT NEEDS AN ORDER TO HAVE THIS CHANGED. pLEASE ADVISED.

## 2016-10-26 NOTE — Telephone Encounter (Signed)
Spoke with Dr. Carlynn PurlSowles and she stated to have patient discontinue the motrin and just take the Mobic 15mg .  Dr. Carlynn PurlSowles stated that it would be ok for Dr. Sherie DonLada to sign order for the nursing home that the patient is currently staying at since she (Dr. Carlynn PurlSowles) has already left the office.  The order will be faxed to our office for a signature.

## 2016-11-12 ENCOUNTER — Other Ambulatory Visit: Payer: Self-pay

## 2016-11-12 ENCOUNTER — Telehealth: Payer: Self-pay

## 2016-11-12 DIAGNOSIS — N401 Enlarged prostate with lower urinary tract symptoms: Secondary | ICD-10-CM

## 2016-11-12 MED ORDER — TAMSULOSIN HCL 0.4 MG PO CAPS: 0.4000 mg | ORAL_CAPSULE | Freq: Every day | ORAL | 3 refills | Status: DC

## 2016-11-12 NOTE — Progress Notes (Signed)
Pt pharmacy sent a refill request of flomax. Your last dictation stated that pt needed to f/u in 3 weeks and pt never had f/u. Do you want me to refill medication?

## 2016-11-12 NOTE — Telephone Encounter (Signed)
Medication refilled

## 2016-11-12 NOTE — Telephone Encounter (Signed)
That is fine to refill the Flomax.

## 2016-11-12 NOTE — Telephone Encounter (Signed)
Pt pharmacy sent a refill request of flomax. Your last note stated pt needed to f/u in 3 weeks which pt never did. Please advise.

## 2016-12-10 ENCOUNTER — Other Ambulatory Visit: Payer: Self-pay | Admitting: Family Medicine

## 2016-12-10 DIAGNOSIS — K5909 Other constipation: Secondary | ICD-10-CM

## 2016-12-10 NOTE — Telephone Encounter (Signed)
Patient requesting refill of Gabapentin to [facility].

## 2016-12-10 NOTE — Telephone Encounter (Signed)
Patient requesting refill of Amitiza to [facility].  

## 2016-12-11 DIAGNOSIS — F419 Anxiety disorder, unspecified: Secondary | ICD-10-CM | POA: Diagnosis not present

## 2016-12-18 DIAGNOSIS — H903 Sensorineural hearing loss, bilateral: Secondary | ICD-10-CM | POA: Diagnosis not present

## 2016-12-18 DIAGNOSIS — H9203 Otalgia, bilateral: Secondary | ICD-10-CM | POA: Diagnosis not present

## 2016-12-18 DIAGNOSIS — H6123 Impacted cerumen, bilateral: Secondary | ICD-10-CM | POA: Diagnosis not present

## 2016-12-18 DIAGNOSIS — M26629 Arthralgia of temporomandibular joint, unspecified side: Secondary | ICD-10-CM | POA: Diagnosis not present

## 2017-02-06 ENCOUNTER — Other Ambulatory Visit: Payer: Self-pay | Admitting: Family Medicine

## 2017-02-06 DIAGNOSIS — E559 Vitamin D deficiency, unspecified: Secondary | ICD-10-CM

## 2017-02-07 ENCOUNTER — Other Ambulatory Visit: Payer: Self-pay | Admitting: Urology

## 2017-02-07 DIAGNOSIS — N401 Enlarged prostate with lower urinary tract symptoms: Secondary | ICD-10-CM

## 2017-02-07 NOTE — Telephone Encounter (Signed)
Patient requesting refill of Vitamin D to [facility].

## 2017-02-13 ENCOUNTER — Other Ambulatory Visit: Payer: Self-pay | Admitting: Family Medicine

## 2017-02-13 NOTE — Telephone Encounter (Signed)
Patient requesting refill of GNP Artifical Tears to [facility].

## 2017-02-25 ENCOUNTER — Encounter: Payer: Self-pay | Admitting: Family Medicine

## 2017-02-25 ENCOUNTER — Ambulatory Visit (INDEPENDENT_AMBULATORY_CARE_PROVIDER_SITE_OTHER): Payer: Medicare Other | Admitting: Family Medicine

## 2017-02-25 VITALS — BP 118/74 | HR 88 | Temp 98.1°F | Resp 16 | Ht 66.0 in | Wt 120.2 lb

## 2017-02-25 DIAGNOSIS — Z974 Presence of external hearing-aid: Secondary | ICD-10-CM

## 2017-02-25 DIAGNOSIS — D649 Anemia, unspecified: Secondary | ICD-10-CM

## 2017-02-25 DIAGNOSIS — Z0000 Encounter for general adult medical examination without abnormal findings: Secondary | ICD-10-CM | POA: Diagnosis not present

## 2017-02-25 DIAGNOSIS — E440 Moderate protein-calorie malnutrition: Secondary | ICD-10-CM

## 2017-02-25 DIAGNOSIS — Z789 Other specified health status: Secondary | ICD-10-CM

## 2017-02-25 DIAGNOSIS — M545 Low back pain: Secondary | ICD-10-CM | POA: Diagnosis not present

## 2017-02-25 DIAGNOSIS — R2681 Unsteadiness on feet: Secondary | ICD-10-CM | POA: Diagnosis not present

## 2017-02-25 DIAGNOSIS — E559 Vitamin D deficiency, unspecified: Secondary | ICD-10-CM

## 2017-02-25 DIAGNOSIS — Z593 Problems related to living in residential institution: Secondary | ICD-10-CM | POA: Diagnosis not present

## 2017-02-25 DIAGNOSIS — H918X3 Other specified hearing loss, bilateral: Secondary | ICD-10-CM

## 2017-02-25 DIAGNOSIS — G8929 Other chronic pain: Secondary | ICD-10-CM

## 2017-02-25 LAB — COMPLETE METABOLIC PANEL WITH GFR
ALT: 9 U/L (ref 9–46)
AST: 15 U/L (ref 10–35)
Albumin: 4.2 g/dL (ref 3.6–5.1)
Alkaline Phosphatase: 121 U/L — ABNORMAL HIGH (ref 40–115)
BUN: 21 mg/dL (ref 7–25)
CO2: 30 mmol/L (ref 20–31)
Calcium: 9.6 mg/dL (ref 8.6–10.3)
Chloride: 100 mmol/L (ref 98–110)
Creat: 0.8 mg/dL (ref 0.70–1.25)
GFR, Est African American: >89 mL/min (ref >=60)
GFR, Est Non African American: >89 mL/min (ref >=60)
Glucose, Bld: 84 mg/dL (ref 65–99)
Potassium: 4.7 mmol/L (ref 3.5–5.3)
Sodium: 138 mmol/L (ref 135–146)
Total Bilirubin: 0.4 mg/dL (ref 0.2–1.2)
Total Protein: 7.1 g/dL (ref 6.1–8.1)

## 2017-02-25 LAB — CBC WITH DIFFERENTIAL/PLATELET
Basophils Absolute: 60 cells/uL (ref 0–200)
Basophils Relative: 1 %
Eosinophils Absolute: 420 {cells}/uL (ref 15–500)
Eosinophils Relative: 7 %
HCT: 38.1 % — ABNORMAL LOW (ref 38.5–50.0)
Hemoglobin: 12.5 g/dL — ABNORMAL LOW (ref 13.2–17.1)
Lymphocytes Relative: 32 %
Lymphs Abs: 1920 {cells}/uL (ref 850–3900)
MCH: 31 pg (ref 27.0–33.0)
MCHC: 32.8 g/dL (ref 32.0–36.0)
MCV: 94.5 fL (ref 80.0–100.0)
MPV: 9.1 fL (ref 7.5–12.5)
Monocytes Absolute: 720 {cells}/uL (ref 200–950)
Monocytes Relative: 12 %
Neutro Abs: 2880 cells/uL (ref 1500–7800)
Neutrophils Relative %: 48 %
Platelets: 348 10*3/uL (ref 140–400)
RBC: 4.03 MIL/uL — ABNORMAL LOW (ref 4.20–5.80)
RDW: 13.5 % (ref 11.0–15.0)
WBC: 6 10*3/uL (ref 3.8–10.8)

## 2017-02-25 MED ORDER — ORPHENADRINE CITRATE ER 100 MG PO TB12: 100.0000 mg | ORAL_TABLET | Freq: Two times a day (BID) | ORAL | 5 refills | Status: DC

## 2017-02-25 NOTE — Progress Notes (Signed)
Name: Aaron Foster   MRN: [id number]    DOB: 03-13-1946   Date:02/25/2017       Progress Note  Subjective  Chief Complaint  Chief Complaint  Patient presents with  . Medicare Wellness    FL2 forms    HPI  Functional ability/safety issues: lives in a group home, uses a walker. Hearing issues: hearing loss, wears hearing aid prn per Dr. Julaine Fusi and sometimes he wears ear protector device Activities of daily living: He needs assistance with bathing, dressing, managing money. Home safety issues: No Issues  End Of Life Planning: Offered verbal information regarding advanced directives, healthcare power of attorney. Sister is the powder attorney Lianne Bushy  Preventative care, Health maintenance, Preventative health measures discussed.  Preventative screenings discussed today: lab work, colonoscopy, PSA.  Men age 33 to 73 years if ever smoked recommended to get a one time AAA ultrasound screening exam.   Low Dose CT Chest recommended if Age 59-80 years, 30 pack-year currently smoking OR have quit w/in 15years.   Lifestyle risk factor issued reviewed: Diet, exercise, weight management, advised patient smoking is not healthy, nutrition/diet.  Preventative health measures discussed (5-10 year plan).  Reviewed and recommended vaccinations: - Pneumovax  - Prevnar  - Annual Influenza - Zostavax - Tdap   Depression screening: Done Fall risk screening: Done Discuss ADLs/IADLs: Done  Current medical providers: See HPI  Other health risk factors identified this visit: No other issues Cognitive impairment issues: None identified  All above discussed with patient. Appropriate education, counseling and referral will be made based upon the above.   Hearing loss: sees Dr. Andee Poles wearing head sets or hearing aid.   Protein Calorie malnutrition: he is pick eater and gets moody during meals at times, throws food at staff or residents. Weight has dropped a little but has been stable  lately, no change in bowel movements, no rashes  Chronic pain: has a brace on left wrist, taking Norflex, staff says he tend to be a complainer, does not seem to be in pain unless if someone is paying attention.   Gait instability: using a walker, he had PT many times in the past.   Anemia: we will recheck level and add iron studies, no sob or decrease in exercise tolerance, does not seem to complain of palpitation   Patient Active Problem List   Diagnosis Date Noted  . Scoliosis 02/22/2016  . Recurrent falls 08/04/2015  . Dermatitis seborrheica 06/24/2015  . Chronic constipation 06/24/2015  . Lives in group home 06/24/2015  . Protein calorie malnutrition (HCC) 06/24/2015  . Hearing loss of both ears 06/24/2015  . Wears hearing aid 06/24/2015  . History of iron deficiency anemia 06/24/2015  . Osteoporosis 06/24/2015  . Adult behavior problem 06/24/2015  . Hiatal hernia 06/24/2015  . Hemorrhoid 06/24/2015  . Gait instability 06/24/2015  . DDD (degenerative disc disease), lumbar 06/24/2015  . Nephrolithiasis 06/24/2015  . Hyperlipidemia 06/24/2015  . Compression fracture of L1 lumbar vertebra (HCC) 06/24/2015  . Family history of upper GI bleeding   . BPH with obstruction/lower urinary tract symptoms 06/21/2015  . Chronic pain 06/19/2015  . Acid reflux 06/19/2015  . Intellectual disability 06/19/2015    Past Surgical History:  Procedure Laterality Date  . arm fracture    . circumscision      Family History  Problem Relation Age of Onset  . Kidney disease Neg Hx     Social History   Social History  . Marital status: Single  Spouse name: N/A  . Number of children: N/A  . Years of education: N/A   Occupational History  . Not on file.   Social History Main Topics  . Smoking status: Former Games developer  . Smokeless tobacco: Never Used  . Alcohol use No  . Drug use: No  . Sexual activity: Not Currently   Other Topics Concern  . Not on file   Social History  Narrative  . No narrative on file     Current Outpatient Prescriptions:  .  acetaminophen (MAPAP) 500 MG tablet, Take 2 tablets (1,000 mg total) by mouth every 8 (eight) hours as needed for moderate pain., Disp: 270 tablet, Rfl: 3 .  ALPRAZolam (XANAX) 0.5 MG tablet, Take 1 tablet (0.5 mg total) by mouth at bedtime as needed for anxiety., Disp: 90 tablet, Rfl: 2 .  AMITIZA 24 MCG capsule, TAKE 1 CAPSULE BY MOUTH 2 TIMES DAILY WITH A MEAL., Disp: 60 capsule, Rfl: 5 .  aspirin (ASPIR-LOW) 81 MG EC tablet, Take 1 tablet (81 mg total) by mouth daily., Disp: 30 tablet, Rfl: 12 .  Calcium Carbonate-Vitamin D3 600-400 MG-UNIT TABS, Take by mouth., Disp: , Rfl:  .  carboxymethylcellulose (REFRESH PLUS) 0.5 % SOLN, 1 drop 3 (three) times daily as needed., Disp: , Rfl:  .  DULoxetine (CYMBALTA) 30 MG capsule, Take 90 mg by mouth daily., Disp: , Rfl:  .  esomeprazole (NEXIUM) 40 MG packet, Take 40 mg by mouth daily before breakfast., Disp: 30 each, Rfl: 12 .  gabapentin (NEURONTIN) 100 MG capsule, TAKE 1 CAPSULE BY MOUTH THREE TIMES DAILY FOR BACK PAIN, Disp: 90 capsule, Rfl: 5 .  GNP ARTIFICIAL TEARS 5-6 MG/ML SOLN, INSTILL 1 DROP INTO EACH EYE FOUR TIMES A DAY, Disp: 15 mL, Rfl: 2 .  ketoconazole (NIZORAL) 2 % shampoo, SHAMPOO HAIR 3 TIMES WEEKLY FOR FUNGAL INFECTION, Disp: 120 mL, Rfl: 5 .  LORazepam (ATIVAN) 1 MG tablet, Take 1 mg by mouth., Disp: , Rfl:  .  mometasone (ELOCON) 0.1 % lotion, Apply topically daily., Disp: , Rfl:  .  Nutritional Supplements (PROMOD) LIQD, MIX 1 OZ (30 ML) WITH 1 OZ WATER, DRINK TWICE DAILY FOR PROTEIN SUPPLEMENT, Disp: 1892 mL, Rfl: 12 .  orphenadrine (NORFLEX) 100 MG tablet, Take 1 tablet (100 mg total) by mouth 2 (two) times daily., Disp: 60 tablet, Rfl: 5 .  QUEtiapine (SEROQUEL) 100 MG tablet, Take by mouth., Disp: , Rfl:  .  REFRESH 1 % ophthalmic solution, INSTILL 1 DROP INTO EACH EYE 3 TIMES DAILY FOR 30 DAYS., Disp: 15 mL, Rfl: 12 .  tamsulosin (FLOMAX) 0.4 MG  CAPS capsule, TAKE 1 CAPSULE BY MOUTH DAILY, Disp: 30 capsule, Rfl: 5 .  traZODone (DESYREL) 100 MG tablet, Take 1 tablet (100 mg total) by mouth at bedtime., Disp: 30 tablet, Rfl: 12 .  Vitamin D, Ergocalciferol, (DRISDOL) 50000 units CAPS capsule, TAKE 1 CAPSULE BY MOUTH ONCE WEEKLY FOR SUPPLEMENT, Disp: 12 capsule, Rfl: 0  No Known Allergies   ROS  Constitutional: Negative for fever, positive for mild  weight change.  Respiratory: Negative for cough and shortness of breath.   Cardiovascular: Negative for chest pain or palpitations.  Gastrointestinal: Negative for abdominal pain, no bowel changes ( still has constipation) .  Musculoskeletal: Positive for gait problem no  joint swelling.  Skin: Negative for rash.  Neurological: Negative for dizziness or headache.  No other specific complaints in a complete review of systems (except as listed in HPI above).  Objective  Vitals:   02/25/17 1133  BP: 118/74  Pulse: 88  Resp: 16  Temp: 98.1 F (36.7 C)  SpO2: 96%  Weight: 120 lb 3 oz (54.5 kg)  Height:  (1.676 m)    Body mass index is 19.4 kg/m.  Physical Exam   Constitutional: Patient appears thin , long beard, wearing head sets. No distress.  HEENT: head atraumatic, normocephalic, pupils equal and reactive to light, ears normal TM bilaterally,  neck supple, throat within normal limits Cardiovascular: Normal rate, regular rhythm and normal heart sounds.  No murmur heard. No BLE edema. Pulmonary/Chest: Effort normal and breath sounds normal. No respiratory distress. Abdominal: Soft.  There is no tenderness. Psychiatric: Patient has a normal mood and affect.Cooperative  PHQ2/9: Depression screen [facility] 2/9 02/25/2017 02/22/2016 06/24/2015  Decreased Interest 0 0 0  Down, Depressed, Hopeless 0 0 0  PHQ - 2 Score 0 0 0    Fall Risk: Fall Risk  02/25/2017 02/22/2016 08/04/2015 06/24/2015  Falls in the past year? Yes No Yes No  Number falls in past yr: 2 or more - 2 or more -   Injury with Fall? No - Yes -  Risk Factor Category  - - High Fall Risk -  Risk for fall due to : - - History of fall(s);Impaired balance/gait;Impaired mobility -  Follow up Falls evaluation completed - Falls prevention discussed -     Assessment & Plan  1. Medicare annual wellness visit, subsequent  Discussed importance of 150 minutes of physical activity weekly, eat two servings of fish weekly, eat one serving of tree nuts ( cashews, pistachios, pecans, almonds.Marland Kitchen) every other day, eat 6 servings of fruit/vegetables daily and drink plenty of water and avoid sweet beverages.   2. Moderate protein-calorie malnutrition (HCC)  - COMPLETE METABOLIC PANEL WITH GFR  3. Gait instability  Continue walker  4. Lives in group home  [facility]  5. Chronic bilateral low back pain without sciatica  - orphenadrine (NORFLEX) 100 MG tablet; Take 1 tablet (100 mg total) by mouth 2 (two) times daily.  Dispense: 60 tablet; Refill: 5  6. Vitamin D deficiency  - VITAMIN D 25 Hydroxy (Vit-D Deficiency, Fractures)  7. Anemia, unspecified type  - CBC with Differential/Platelet - Iron, TIBC and Ferritin Panel  8. Wears hearing aid   9. Other specified hearing loss of both ears

## 2017-02-25 NOTE — Patient Instructions (Signed)
Preventive Care 70 Years and Older, Male Preventive care refers to lifestyle choices and visits with your health care provider that can promote health and wellness. What does preventive care include?  A yearly physical exam. This is also called an annual well check.  Dental exams once or twice a year.  Routine eye exams. Ask your health care provider how often you should have your eyes checked.  Personal lifestyle choices, including:  Daily care of your teeth and gums.  Regular physical activity.  Eating a healthy diet.  Avoiding tobacco and drug use.  Limiting alcohol use.  Practicing safe sex.  Taking low doses of aspirin every day.  Taking vitamin and mineral supplements as recommended by your health care provider. What happens during an annual well check? The services and screenings done by your health care provider during your annual well check will depend on your age, overall health, lifestyle risk factors, and family history of disease. Counseling  Your health care provider may ask you questions about your:  Alcohol use.  Tobacco use.  Drug use.  Emotional well-being.  Home and relationship well-being.  Sexual activity.  Eating habits.  History of falls.  Memory and ability to understand (cognition).  Work and work environment. Screening  You may have the following tests or measurements:  Height, weight, and BMI.  Blood pressure.  Lipid and cholesterol levels. These may be checked every 5 years, or more frequently if you are over 50 years old.  Skin check.  Lung cancer screening. You may have this screening every year starting at age 55 if you have a 30-pack-year history of smoking and currently smoke or have quit within the past 15 years.  Fecal occult blood test (FOBT) of the stool. You may have this test every year starting at age 50.  Flexible sigmoidoscopy or colonoscopy. You may have a sigmoidoscopy every 5 years or a colonoscopy every 10  years starting at age 50.  Prostate cancer screening. Recommendations will vary depending on your family history and other risks.  Hepatitis C blood test.  Hepatitis B blood test.  Sexually transmitted disease (STD) testing.  Diabetes screening. This is done by checking your blood sugar (glucose) after you have not eaten for a while (fasting). You may have this done every 1-3 years.  Abdominal aortic aneurysm (AAA) screening. You may need this if you are a current or former smoker.  Osteoporosis. You may be screened starting at age 70 if you are at high risk. Talk with your health care provider about your test results, treatment options, and if necessary, the need for more tests. Vaccines  Your health care provider may recommend certain vaccines, such as:  Influenza vaccine. This is recommended every year.  Tetanus, diphtheria, and acellular pertussis (Tdap, Td) vaccine. You may need a Td booster every 10 years.  Varicella vaccine. You may need this if you have not been vaccinated.  Zoster vaccine. You may need this after age 60.  Measles, mumps, and rubella (MMR) vaccine. You may need at least one dose of MMR if you were born in 1957 or later. You may also need a second dose.  Pneumococcal 13-valent conjugate (PCV13) vaccine. One dose is recommended after age 70.  Pneumococcal polysaccharide (PPSV23) vaccine. One dose is recommended after age 70.  Meningococcal vaccine. You may need this if you have certain conditions.  Hepatitis A vaccine. You may need this if you have certain conditions or if you travel or work in places where   you may be exposed to hepatitis A.  Hepatitis B vaccine. You may need this if you have certain conditions or if you travel or work in places where you may be exposed to hepatitis B.  Haemophilus influenzae type b (Hib) vaccine. You may need this if you have certain risk factors. Talk to your health care provider about which screenings and vaccines you  need and how often you need them. This information is not intended to replace advice given to you by your health care provider. Make sure you discuss any questions you have with your health care provider. Document Released: 11/18/2015 Document Revised: 07/11/2016 Document Reviewed: 08/23/2015 Elsevier Interactive Patient Education  2017 [employer].

## 2017-02-26 LAB — IRON,TIBC AND FERRITIN PANEL
%SAT: 21 % (ref 15–60)
Ferritin: 37 ng/mL (ref 20–380)
Iron: 64 ug/dL (ref 50–180)
TIBC: 312 ug/dL (ref 250–425)

## 2017-02-26 LAB — VITAMIN D 25 HYDROXY (VIT D DEFICIENCY, FRACTURES): Vit D, 25-Hydroxy: 60 ng/mL (ref 30–100)

## 2017-03-04 DIAGNOSIS — H5203 Hypermetropia, bilateral: Secondary | ICD-10-CM | POA: Diagnosis not present

## 2017-03-04 DIAGNOSIS — H2513 Age-related nuclear cataract, bilateral: Secondary | ICD-10-CM | POA: Diagnosis not present

## 2017-03-04 DIAGNOSIS — H04121 Dry eye syndrome of right lacrimal gland: Secondary | ICD-10-CM | POA: Diagnosis not present

## 2017-03-04 DIAGNOSIS — H52223 Regular astigmatism, bilateral: Secondary | ICD-10-CM | POA: Diagnosis not present

## 2017-03-04 DIAGNOSIS — H04122 Dry eye syndrome of left lacrimal gland: Secondary | ICD-10-CM | POA: Diagnosis not present

## 2017-03-04 DIAGNOSIS — H524 Presbyopia: Secondary | ICD-10-CM | POA: Diagnosis not present

## 2017-03-05 ENCOUNTER — Other Ambulatory Visit: Payer: Self-pay | Admitting: Family Medicine

## 2017-03-05 NOTE — Telephone Encounter (Signed)
Patient requesting refill of Ketoconazole shampoo to [facility].

## 2017-03-11 ENCOUNTER — Other Ambulatory Visit: Payer: Self-pay | Admitting: Family Medicine

## 2017-03-11 NOTE — Telephone Encounter (Signed)
Patient requesting refill of Trazodone to [facility].

## 2017-03-11 NOTE — Telephone Encounter (Signed)
Patient requesting refill of Nexium and Aspir-Low to [facility].

## 2017-04-08 ENCOUNTER — Other Ambulatory Visit: Payer: Self-pay | Admitting: Family Medicine

## 2017-04-08 ENCOUNTER — Other Ambulatory Visit: Payer: Self-pay | Admitting: Emergency Medicine

## 2017-04-08 DIAGNOSIS — E559 Vitamin D deficiency, unspecified: Secondary | ICD-10-CM | POA: Insufficient documentation

## 2017-04-08 MED ORDER — CHOLECALCIFEROL 25 MCG (1000 UT) PO TABS: 1000.0000 [IU] | ORAL_TABLET | Freq: Every day | ORAL | 1 refills | Status: DC

## 2017-04-08 NOTE — Telephone Encounter (Signed)
Please call patient and advise him that his most recent Vitamin D level was normal. I want him to stop taking the once weekly 50,000 unit pill and start taking OTC 1000-2000 units of Vitamin D daily.  Thank you!

## 2017-04-08 NOTE — Progress Notes (Unsigned)
Rx placed for Vitamin D 1000 units once daily. Please notify group home. Thank you!

## 2017-04-09 NOTE — Progress Notes (Signed)
Order faxed to group home and to pharmacy

## 2017-05-01 DIAGNOSIS — F419 Anxiety disorder, unspecified: Secondary | ICD-10-CM | POA: Diagnosis not present

## 2017-05-02 ENCOUNTER — Other Ambulatory Visit: Payer: Self-pay | Admitting: Family Medicine

## 2017-05-10 ENCOUNTER — Encounter: Payer: Self-pay | Admitting: Family Medicine

## 2017-05-10 ENCOUNTER — Ambulatory Visit (INDEPENDENT_AMBULATORY_CARE_PROVIDER_SITE_OTHER): Payer: Medicare Other | Admitting: Family Medicine

## 2017-05-10 VITALS — BP 109/66 | HR 66 | Temp 97.5°F | Resp 16 | Ht 66.0 in | Wt 118.2 lb

## 2017-05-10 DIAGNOSIS — R2681 Unsteadiness on feet: Secondary | ICD-10-CM | POA: Diagnosis not present

## 2017-05-10 DIAGNOSIS — Z9181 History of falling: Secondary | ICD-10-CM

## 2017-05-10 DIAGNOSIS — Z593 Problems related to living in residential institution: Secondary | ICD-10-CM

## 2017-05-10 DIAGNOSIS — M25461 Effusion, right knee: Secondary | ICD-10-CM | POA: Diagnosis not present

## 2017-05-10 DIAGNOSIS — Z789 Other specified health status: Secondary | ICD-10-CM

## 2017-05-10 NOTE — Patient Instructions (Signed)
Needs bed side commode  Needs assistance to use bathroom during the night.

## 2017-05-10 NOTE — Progress Notes (Signed)
Name: Aaron Foster   MRN: [id number]    DOB: [birth year]   Date:05/10/2017       Progress Note  Subjective  Chief Complaint  Chief Complaint  Patient presents with  . 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 He seems to dip or loose strength and tumble over.   . Knee Pain    He continues to complain of arm pain in his right arm and knee pain.  Working with PT, they want to ask for a Orthopedic referral.    HPI  Recurrent falls and Right knee pain: he had about 4 falls over the past couple of months. He has home PT ( at the group home), he seems to fall while walking with assistance, however most falls are at night when he goes to the bathroom by himself. Caregiver Algis Liming) states he is very sleepy during the night and even falls asleep while sitting at the commode. [name] on medication that causes sedation and advised to never let him walk at night unassisted.   Knee pain: he has been complaining of knee pain intermittently for the past few weeks, no redness, he uses a walker, it does not seem to have changed his gait.   Patient Active Problem List   Diagnosis Date Noted  . Vitamin D deficiency 04/08/2017  . Scoliosis 02/22/2016  . Recurrent falls 08/04/2015  . Dermatitis seborrheica 06/24/2015  . Chronic constipation 06/24/2015  . Lives in group home 06/24/2015  . Protein calorie malnutrition ([place]) 06/24/2015  . Hearing loss of both ears 06/24/2015  . Wears hearing aid 06/24/2015  . History of iron deficiency anemia 06/24/2015  . Osteoporosis 06/24/2015  . Adult behavior problem 06/24/2015  . Hiatal hernia 06/24/2015  . Hemorrhoid 06/24/2015  . Gait instability 06/24/2015  . DDD (degenerative disc disease), lumbar 06/24/2015  . Nephrolithiasis 06/24/2015  . Hyperlipidemia 06/24/2015  . Compression fracture of L1 lumbar vertebra ([place]) 06/24/2015  . Family history of upper GI bleeding   . BPH with obstruction/lower urinary tract  symptoms 06/21/2015  . Chronic pain 06/19/2015  . Acid reflux 06/19/2015  . Intellectual disability 06/19/2015    Past Surgical History:  Procedure Laterality Date  . arm fracture    . circumscision      Family History  Problem Relation Age of Onset  . Kidney disease Neg Hx     Social History   Social History  . Marital status: Single    Spouse name: N/A  . Number of children: N/A  . Years of education: N/A   Occupational History  . Not on file.   Social History Main Topics  . Smoking status: Former Research scientist (life sciences)  . Smokeless tobacco: Never Used  . Alcohol use No  . Drug use: No  . Sexual activity: Not Currently   Other Topics Concern  . Not on file   Social History Narrative  . No narrative on file     Current Outpatient Prescriptions:  .  acetaminophen (MAPAP) 500 MG tablet, Take 2 tablets (1,000 mg total) by mouth every 8 (eight) hours as needed for moderate pain., Disp: 270 tablet, Rfl: 3 .  ALPRAZolam (XANAX) 0.5 MG tablet, Take 1 tablet (0.5 mg total) by mouth at bedtime as needed for anxiety., Disp: 90 tablet, Rfl: 2 .  AMITIZA 24 MCG capsule, TAKE 1 CAPSULE BY MOUTH 2 TIMES DAILY WITH A MEAL., Disp: 60 capsule, Rfl: 5 .  ASPIR-LOW  81 MG EC tablet, TAKE 1 TABLET BY MOUTH ONCE DAILY FOR CIRCULATION, Disp: 30 tablet, Rfl: 5 .  Calcium Carbonate-Vitamin D3 600-400 MG-UNIT TABS, Take by mouth., Disp: , Rfl:  .  carboxymethylcellulose (REFRESH PLUS) 0.5 % SOLN, 1 drop 3 (three) times daily as needed., Disp: , Rfl:  .  Cholecalciferol 1000 units tablet, Take 1 tablet (1,000 Units total) by mouth daily., Disp: 90 tablet, Rfl: 1 .  DULoxetine (CYMBALTA) 30 MG capsule, Take 90 mg by mouth daily., Disp: , Rfl:  .  esomeprazole (NEXIUM) 40 MG capsule, TAKE 1 CAPSULE BY MOUTH AT LEAST 30 MINUTES BEFORE FOOD, ONCE DAILY FOR REFLUX., Disp: 30 capsule, Rfl: 5 .  gabapentin (NEURONTIN) 100 MG capsule, Take 200 mg by mouth 3 (three) times daily., Disp: , Rfl:  .  GNP ARTIFICIAL  TEARS 5-6 MG/ML SOLN, INSTILL 1 DROP INTO EACH EYE FOUR TIMES A DAY, Disp: 15 mL, Rfl: 2 .  ibuprofen (ADVIL,MOTRIN) 800 MG tablet, Take 800 mg by mouth every 6 (six) hours as needed., Disp: , Rfl:  .  ketoconazole (NIZORAL) 2 % cream, APPLY TOPICALLY TO FACE SPARINGLY TWICE DAILY, Disp: 60 g, Rfl: 11 .  ketoconazole (NIZORAL) 2 % shampoo, SHAMPOO HAIR 3 TIMES WEEKLY FOR FUNGAL INFECTION, Disp: 120 mL, Rfl: 5 .  LORazepam (ATIVAN) 0.5 MG tablet, Take 1 mg by mouth. 1 tablet PO for agitation greater than 10 mint, may repeat in 30 mins., Disp: , Rfl:  .  mometasone (ELOCON) 0.1 % lotion, Apply topically daily., Disp: , Rfl:  .  Nutritional Supplements (PROMOD) LIQD, MIX 1 OZ (30 ML) WITH 1 OZ WATER, DRINK TWICE DAILY FOR PROTEIN SUPPLEMENT, Disp: 1892 mL, Rfl: 12 .  orphenadrine (NORFLEX) 100 MG tablet, Take 1 tablet (100 mg total) by mouth 2 (two) times daily., Disp: 60 tablet, Rfl: 5 .  QUEtiapine (SEROQUEL) 200 MG tablet, Take 200 mg by mouth daily. 4 pm (Dr. Tamera Punt, Psychiatry), Disp: , Rfl:  .  REFRESH 1 % ophthalmic solution, INSTILL 1 DROP INTO EACH EYE 3 TIMES DAILY FOR 30 DAYS., Disp: 15 mL, Rfl: 12 .  tamsulosin (FLOMAX) 0.4 MG CAPS capsule, TAKE 1 CAPSULE BY MOUTH DAILY, Disp: 30 capsule, Rfl: 5 .  traZODone (DESYREL) 100 MG tablet, TAKE 1 TABLET BY MOUTH DAILY AT BEDTIME FOR SLEEP, Disp: 30 tablet, Rfl: 5  No Known Allergies   ROS  Ten systems reviewed and is negative except as mentioned in HPI   Objective  Vitals:   05/10/17 0926  BP: 109/66  Pulse: 66  Resp: 16  Temp: (!) 97.5 F (36.4 C)  TempSrc: Oral  SpO2: 97%  Weight: 118 lb 3.2 oz (53.6 kg)  Height: 5' 6"  (1.676 m)    Body mass index is 19.08 kg/m.  Physical Exam  Constitutional: Patient appears well-developed and well-nourished. No distress.  HEENT: head atraumatic, normocephalic, pupils equal and reactive to light, neck supple, throat within normal limits Cardiovascular: Normal rate, regular rhythm and  normal heart sounds.  No murmur heard. No BLE edema. Pulmonary/Chest: Effort normal and breath sounds normal. No respiratory distress. Abdominal: Soft.  There is no tenderness. Psychiatric: Patient is awake, seemed frustrated at time, trying to talk but unable to understand him Muscular Skeletal: wearing a left arm brace, right knee effusion, no redness, pain during rom of right knee. Uses Walker Skin: holding left side ( showed me a scar from recent fall)    Recent Results (from the past 2160 hour(s))  VITAMIN D  25 Hydroxy (Vit-D Deficiency, Fractures)     Status: None   Collection Time: 02/25/17 12:04 PM  Result Value Ref Range   Vit D, 25-Hydroxy 60 30 - 100 ng/mL    Comment: Vitamin D Status           25-OH Vitamin D        Deficiency                <20 ng/mL        Insufficiency         20 - 29 ng/mL        Optimal             > or = 30 ng/mL   For 25-OH Vitamin D testing on patients on D2-supplementation and patients for whom quantitation of D2 and D3 fractions is required, the QuestAssureD 25-OH VIT D, (D2,D3), LC/MS/MS is recommended: order code [id number] (patients > 2 yrs).   CBC with Differential/Platelet     Status: Abnormal   Collection Time: 02/25/17 12:04 PM  Result Value Ref Range   WBC 6.0 3.8 - 10.8 K/uL   RBC 4.03 (L) 4.20 - 5.80 MIL/uL   Hemoglobin 12.5 (L) 13.2 - 17.1 g/dL   HCT 38.1 (L) 38.5 - 50.0 %   MCV 94.5 80.0 - 100.0 fL   MCH 31.0 27.0 - 33.0 pg   MCHC 32.8 32.0 - 36.0 g/dL   RDW 13.5 11.0 - 15.0 %   Platelets 348 140 - 400 K/uL   MPV 9.1 7.5 - 12.5 fL   Neutro Abs 2,880 1,500 - 7,800 cells/uL   Lymphs Abs 1,920 850 - 3,900 cells/uL   Monocytes Absolute 720 200 - 950 cells/uL   Eosinophils Absolute 420 15 - 500 cells/uL   Basophils Absolute 60 0 - 200 cells/uL   Neutrophils Relative % 48 %   Lymphocytes Relative 32 %   Monocytes Relative 12 %   Eosinophils Relative 7 %   Basophils Relative 1 %   Smear Review Criteria for review not met   Iron,  TIBC and Ferritin Panel     Status: None   Collection Time: 02/25/17 12:04 PM  Result Value Ref Range   Ferritin 37 20 - 380 ng/mL   Iron 64 50 - 180 ug/dL   TIBC 312 250 - 425 ug/dL   %SAT 21 15 - 60 %  COMPLETE METABOLIC PANEL WITH GFR     Status: Abnormal   Collection Time: 02/25/17 12:04 PM  Result Value Ref Range   Sodium 138 135 - 146 mmol/L   Potassium 4.7 3.5 - 5.3 mmol/L   Chloride 100 98 - 110 mmol/L   CO2 30 20 - 31 mmol/L   Glucose, Bld 84 65 - 99 mg/dL   BUN 21 7 - 25 mg/dL   Creat 0.80 0.70 - 1.25 mg/dL    Comment:   For patients > or = 70 years of age: The upper reference limit for Creatinine is approximately 13% higher for people identified as African-American.      Total Bilirubin 0.4 0.2 - 1.2 mg/dL   Alkaline Phosphatase 121 (H) 40 - 115 U/L   AST 15 10 - 35 U/L   ALT 9 9 - 46 U/L   Total Protein 7.1 6.1 - 8.1 g/dL   Albumin 4.2 3.6 - 5.1 g/dL   Calcium 9.6 8.6 - 10.3 mg/dL   GFR, Est African American >89 >=60 mL/min   GFR, Est Non African  American >89 >=60 mL/min      PHQ2/9: Depression screen [facility] 2/9 02/25/2017 02/22/2016 06/24/2015  Decreased Interest 0 0 0  Down, Depressed, Hopeless 0 0 0  PHQ - 2 Score 0 0 0     Fall Risk: Fall Risk  05/10/2017 02/25/2017 02/22/2016 08/04/2015 06/24/2015  Falls in the past year? Yes Yes No Yes No  Number falls in past yr: 2 or more 2 or more - 2 or more -  Injury with Fall? Yes No - Yes -  Risk Factor Category  High Fall Risk - - High Fall Risk -  Risk for fall due to : - - - History of fall(s);Impaired balance/gait;Impaired mobility -  Follow up - Falls evaluation completed - Falls prevention discussed -     Functional Status Survey: Is the patient deaf or have difficulty hearing?: Yes Does the patient have difficulty seeing, even when wearing glasses/contacts?: Yes Does the patient have difficulty concentrating, remembering, or making decisions?: Yes Does the patient have difficulty walking or climbing  stairs?: Yes Does the patient have difficulty dressing or bathing?: Yes Does the patient have difficulty doing errands alone such as visiting a doctor's office or shopping?: Yes    Assessment & Plan  1. Gait instability  - Ambulatory referral to Orthopedic Surgery  2. Effusion of right knee  - Ambulatory referral to Orthopedic Surgery  3. Lives in group home   4. History of recent fall  - Ambulatory referral to Orthopedic Surgery

## 2017-05-29 DIAGNOSIS — M1711 Unilateral primary osteoarthritis, right knee: Secondary | ICD-10-CM | POA: Diagnosis not present

## 2017-06-06 ENCOUNTER — Other Ambulatory Visit: Payer: Self-pay | Admitting: Family Medicine

## 2017-06-06 DIAGNOSIS — K5909 Other constipation: Secondary | ICD-10-CM

## 2017-06-06 NOTE — Telephone Encounter (Signed)
Patient requesting refill of Amitiza to [facility].

## 2017-06-06 NOTE — Telephone Encounter (Signed)
Patient requesting refill of Mapap to [facility].

## 2017-06-28 ENCOUNTER — Ambulatory Visit (INDEPENDENT_AMBULATORY_CARE_PROVIDER_SITE_OTHER): Payer: Medicare Other | Admitting: Family Medicine

## 2017-06-28 ENCOUNTER — Encounter: Payer: Self-pay | Admitting: Family Medicine

## 2017-06-28 VITALS — BP 116/68 | HR 82 | Temp 98.1°F | Resp 16 | Ht 66.0 in | Wt 119.2 lb

## 2017-06-28 DIAGNOSIS — R296 Repeated falls: Secondary | ICD-10-CM | POA: Diagnosis not present

## 2017-06-28 DIAGNOSIS — E440 Moderate protein-calorie malnutrition: Secondary | ICD-10-CM | POA: Diagnosis not present

## 2017-06-28 DIAGNOSIS — Z974 Presence of external hearing-aid: Secondary | ICD-10-CM | POA: Diagnosis not present

## 2017-06-28 DIAGNOSIS — G8929 Other chronic pain: Secondary | ICD-10-CM | POA: Diagnosis not present

## 2017-06-28 DIAGNOSIS — L219 Seborrheic dermatitis, unspecified: Secondary | ICD-10-CM | POA: Diagnosis not present

## 2017-06-28 DIAGNOSIS — H918X3 Other specified hearing loss, bilateral: Secondary | ICD-10-CM | POA: Diagnosis not present

## 2017-06-28 DIAGNOSIS — Z23 Encounter for immunization: Secondary | ICD-10-CM

## 2017-06-28 DIAGNOSIS — K5909 Other constipation: Secondary | ICD-10-CM | POA: Diagnosis not present

## 2017-06-28 DIAGNOSIS — Z593 Problems related to living in residential institution: Secondary | ICD-10-CM

## 2017-06-28 DIAGNOSIS — M545 Low back pain: Secondary | ICD-10-CM

## 2017-06-28 DIAGNOSIS — Z789 Other specified health status: Secondary | ICD-10-CM

## 2017-06-28 DIAGNOSIS — R2681 Unsteadiness on feet: Secondary | ICD-10-CM

## 2017-06-28 NOTE — Progress Notes (Signed)
Name: Aaron Foster   MRN: [id number]    DOB: 04-May-1946   Date:06/28/2017       Progress Note  Subjective  Chief Complaint  Chief Complaint  Patient presents with  . Gait Problem    4 month follow up for instability  . Hyperlipidemia    HPI  Hearing loss: sees Dr. Andee Poles wearing right side hearing aid, and is getting for left side now  Protein Calorie malnutrition: he is pick eater and used to get moody and throw food but not lately, better behavior and gained over 1 lbs since last visit.   Chronic pain: has a brace on left wrist and right knee, had injections on right knee and seems to be doing better. He has chronic back pain and is  taking Norflex.   Depression: he is doing better, seeing Dr. Ave Filter, enjoys singing and dancing at the day program, more cooperative at the group home.   Gait instability: using a walker, he had PT many times in the past. Discussed getting him up at night to walk him to the bathroom, since last fall was at night.   Anemia: of chronic disease, last labs stable, no SOB.   Chronic constipation: taking medication and has regular bowel movements, every other day, no straining or pain.   Dermatitis seborrheic:using medication, stable at this time.    Patient Active Problem List   Diagnosis Date Noted  . Vitamin D deficiency 04/08/2017  . Scoliosis 02/22/2016  . Recurrent falls 08/04/2015  . Dermatitis seborrheica 06/24/2015  . Chronic constipation 06/24/2015  . Lives in group home 06/24/2015  . Protein calorie malnutrition ([facility]) 06/24/2015  . Hearing loss of both ears 06/24/2015  . Wears hearing aid 06/24/2015  . History of iron deficiency anemia 06/24/2015  . Osteoporosis 06/24/2015  . Adult behavior problem 06/24/2015  . Hiatal hernia 06/24/2015  . Hemorrhoid 06/24/2015  . Gait instability 06/24/2015  . DDD (degenerative disc disease), lumbar 06/24/2015  . Nephrolithiasis 06/24/2015  . Hyperlipidemia 06/24/2015  . Compression  fracture of L1 lumbar vertebra ([facility]) 06/24/2015  . Family history of upper GI bleeding   . BPH with obstruction/lower urinary tract symptoms 06/21/2015  . Chronic pain 06/19/2015  . Acid reflux 06/19/2015  . Intellectual disability 06/19/2015    Past Surgical History:  Procedure Laterality Date  . arm fracture    . circumscision      Family History  Problem Relation Age of Onset  . Kidney disease Neg Hx     Social History   Social History  . Marital status: Single    Spouse name: N/A  . Number of children: N/A  . Years of education: N/A   Occupational History  . Not on file.   Social History Main Topics  . Smoking status: Former Games developer  . Smokeless tobacco: Never Used  . Alcohol use No  . Drug use: No  . Sexual activity: Not Currently   Other Topics Concern  . Not on file   Social History Narrative  . No narrative on file     Current Outpatient Prescriptions:  .  ALPRAZolam (XANAX) 0.5 MG tablet, Take 1 tablet (0.5 mg total) by mouth at bedtime as needed for anxiety., Disp: 90 tablet, Rfl: 2 .  AMITIZA 24 MCG capsule, TAKE 1 CAPSULE BY MOUTH 2 TIMES DAILY WITH A MEAL., Disp: 60 capsule, Rfl: 5 .  ASPIR-LOW 81 MG EC tablet, TAKE 1 TABLET BY MOUTH ONCE DAILY FOR CIRCULATION, Disp: 30 tablet, Rfl:  5 .  Calcium Carbonate-Vitamin D3 600-400 MG-UNIT TABS, Take by mouth., Disp: , Rfl:  .  carboxymethylcellulose (REFRESH PLUS) 0.5 % SOLN, 1 drop 3 (three) times daily as needed., Disp: , Rfl:  .  Cholecalciferol 1000 units tablet, Take 1 tablet (1,000 Units total) by mouth daily., Disp: 90 tablet, Rfl: 1 .  DULoxetine (CYMBALTA) 30 MG capsule, Take 90 mg by mouth daily., Disp: , Rfl:  .  esomeprazole (NEXIUM) 40 MG capsule, TAKE 1 CAPSULE BY MOUTH AT LEAST 30 MINUTES BEFORE FOOD, ONCE DAILY FOR REFLUX., Disp: 30 capsule, Rfl: 5 .  gabapentin (NEURONTIN) 100 MG capsule, Take 200 mg by mouth 3 (three) times daily., Disp: , Rfl:  .  GNP ARTIFICIAL TEARS 5-6 MG/ML SOLN,  INSTILL 1 DROP INTO EACH EYE FOUR TIMES A DAY, Disp: 15 mL, Rfl: 2 .  ibuprofen (ADVIL,MOTRIN) 800 MG tablet, Take 800 mg by mouth every 6 (six) hours as needed., Disp: , Rfl:  .  ketoconazole (NIZORAL) 2 % cream, APPLY TOPICALLY TO FACE SPARINGLY TWICE DAILY, Disp: 60 g, Rfl: 11 .  ketoconazole (NIZORAL) 2 % shampoo, SHAMPOO HAIR 3 TIMES WEEKLY FOR FUNGAL INFECTION, Disp: 120 mL, Rfl: 5 .  LORazepam (ATIVAN) 0.5 MG tablet, Take 1 mg by mouth. 1 tablet PO for agitation greater than 10 mint, may repeat in 30 mins., Disp: , Rfl:  .  MAPAP 500 MG tablet, TAKE 2 TABLETS BY MOUTH 3 TIMES DAILY FOR PAIN, Disp: 180 tablet, Rfl: 1 .  mometasone (ELOCON) 0.1 % lotion, Apply topically daily., Disp: , Rfl:  .  Nutritional Supplements (PROMOD) LIQD, MIX 1 OZ (30 ML) WITH 1 OZ WATER, DRINK TWICE DAILY FOR PROTEIN SUPPLEMENT, Disp: 1892 mL, Rfl: 12 .  orphenadrine (NORFLEX) 100 MG tablet, Take 1 tablet (100 mg total) by mouth 2 (two) times daily., Disp: 60 tablet, Rfl: 5 .  QUEtiapine (SEROQUEL) 200 MG tablet, Take 200 mg by mouth daily. 4 pm (Dr. Ave Filter, Psychiatry), Disp: , Rfl:  .  REFRESH 1 % ophthalmic solution, INSTILL 1 DROP INTO EACH EYE 3 TIMES DAILY FOR 30 DAYS., Disp: 15 mL, Rfl: 12 .  tamsulosin (FLOMAX) 0.4 MG CAPS capsule, TAKE 1 CAPSULE BY MOUTH DAILY, Disp: 30 capsule, Rfl: 5 .  traZODone (DESYREL) 100 MG tablet, TAKE 1 TABLET BY MOUTH DAILY AT BEDTIME FOR SLEEP, Disp: 30 tablet, Rfl: 5  No Known Allergies   ROS  Ten systems reviewed and is negative except as mentioned in HPI   Objective  Vitals:   06/28/17 1118  BP: 116/68  Pulse: 82  Resp: 16  Temp: 98.1 F (36.7 C)  SpO2: 96%  Weight: 119 lb 4 oz (54.1 kg)  Height: 5\' 6"  (1.676 m)    Body mass index is 19.25 kg/m.  Physical Exam  Constitutional: Patient appears well-developed and very thin. No distress.  HEENT: head atraumatic, normocephalic, pupils equal and reactive to light, ears : hearing aid on right side, neck  supple, throat within normal limits Cardiovascular: Normal rate, regular rhythm and normal heart sounds.  No murmur heard. No BLE edema. Pulmonary/Chest: Effort normal and breath sounds normal. No respiratory distress. Abdominal: Soft.  There is no tenderness. Psychiatric: Cooperative, seems to be in good spirits today, smiling Muscular Skeletal: brace on left wrist, and on right knee, no pain during palpation of his back   PHQ2/9: Depression screen [facility] 2/9 02/25/2017 02/22/2016 06/24/2015  Decreased Interest 0 0 0  Down, Depressed, Hopeless 0 0 0  PHQ -  2 Score 0 0 0     Fall Risk: Fall Risk  05/10/2017 02/25/2017 02/22/2016 08/04/2015 06/24/2015  Falls in the past year? Yes Yes No Yes No  Number falls in past yr: 2 or more 2 or more - 2 or more -  Injury with Fall? Yes No - Yes -  Risk Factor Category  High Fall Risk - - High Fall Risk -  Risk for fall due to : - - - History of fall(s);Impaired balance/gait;Impaired mobility -  Follow up - Falls evaluation completed - Falls prevention discussed -       Assessment & Plan  1. Gait instability  Larey Seat once more since last visit, no injuries, continue walker - he used the bedside commode as a walker at night.   2. Lives in group home  Caregivers came in with him  3. Moderate protein-calorie malnutrition ([facility])  Doing better, he gained weight , 1 lbs since last visit   4. Chronic bilateral low back pain without sciatica  stable  5. Other specified hearing loss of both ears  Stable, wears hearing aid on right side  6. Need for influenza vaccination  - Flu vaccine HIGH DOSE PF (Fluzone High dose)  7. Recurrent falls  We will try to get scheduled bathroom assistance at night time  8. Wears hearing aid   9. Chronic constipation  Doing well on medication  10. Dermatitis seborrheica  stable

## 2017-07-26 ENCOUNTER — Ambulatory Visit (INDEPENDENT_AMBULATORY_CARE_PROVIDER_SITE_OTHER): Payer: Medicare Other | Admitting: Family Medicine

## 2017-07-26 ENCOUNTER — Encounter: Payer: Self-pay | Admitting: Family Medicine

## 2017-07-26 VITALS — BP 110/60 | HR 110 | Temp 98.2°F | Resp 20 | Ht 66.0 in | Wt 117.3 lb

## 2017-07-26 DIAGNOSIS — H6123 Impacted cerumen, bilateral: Secondary | ICD-10-CM | POA: Diagnosis not present

## 2017-07-26 DIAGNOSIS — Z974 Presence of external hearing-aid: Secondary | ICD-10-CM | POA: Diagnosis not present

## 2017-07-26 NOTE — Progress Notes (Signed)
07/29/2017 11:58 AM   Aaron Foster [birth year] [id number]  Referring provider: Steele Sizer, MD [address] [place], [place] [address]  Chief Complaint  Patient presents with  . Follow-up    bph    HPI: Aaron Foster a 71 year old Caucasian male with BPH and LUTS who presents today for yearly follow up.  BPH with LUTS His IPSS score today is 9, which is moderate lower urinary tract symptomatology. [name] pleased with his urinary symptoms.  His previous IPSS score was 12/4.  His previous PVR was 40 mL.    His main complaint is urinary frequency and nocturia.    He denies any dysuria, hematuria or suprapubic pain.   He currently taking tamsulosin.     He also denies any recent fevers, chills, nausea or vomiting.  His family history is unknown.       IPSS    Row Name 07/29/17 1100         International Prostate Symptom Score   How often have you had the sensation of not emptying your bladder? Not at All     How often have you had to urinate less than every two hours? About half the time     How often have you found you stopped and started again several times when you urinated? Not at All     How often have you found it difficult to postpone urination? More than half the time     How often have you had a weak urinary stream? Not at All     How often have you had to strain to start urination? Not at All     How many times did you typically get up at night to urinate? 2 Times     Total IPSS Score 9       Quality of Life due to urinary symptoms   If you were to spend the rest of your life with your urinary condition just the way it is now how would you feel about that? Pleased        Score:  1-7 Mild 8-19 Moderate 20-35 Severe     PMH: Past Medical History:  Diagnosis Date  . Anemia   . Bilateral renal cysts   . BPH (benign prostatic hyperplasia)   . Cannot hear   . Chronic back pain   . Constipation   . Esophageal reflux   .  Hemorrhoid   . HLD (hyperlipidemia)   . Insomnia   . Nephrolithiasis   . Osteoporosis   . Phimosis   . Seborrheic dermatitis     Surgical History: Past Surgical History:  Procedure Laterality Date  . arm fracture    . circumscision      Home Medications:  Allergies as of 07/29/2017   No Known Allergies     Medication List       Accurate as of 07/29/17 11:58 AM. Always use your most recent med list.          ALPRAZolam 0.5 MG tablet Commonly known as:  XANAX Take 1 tablet (0.5 mg total) by mouth at bedtime as needed for anxiety.   AMITIZA 24 MCG capsule Generic drug:  lubiprostone TAKE 1 CAPSULE BY MOUTH 2 TIMES DAILY WITH A MEAL.   ASPIR-LOW 81 MG EC tablet Generic drug:  aspirin TAKE 1 TABLET BY MOUTH ONCE DAILY FOR CIRCULATION   Calcium Carbonate-Vitamin D3 600-400 MG-UNIT Tabs Take by mouth.   carboxymethylcellulose 0.5 % Soln  Commonly known as:  REFRESH PLUS 1 drop 3 (three) times daily as needed.   REFRESH 1 % ophthalmic solution Generic drug:  carboxymethylcellulose INSTILL 1 DROP INTO EACH EYE 3 TIMES DAILY FOR 30 DAYS.   Cholecalciferol 1000 units tablet Take 1 tablet (1,000 Units total) by mouth daily.   DULoxetine 30 MG capsule Commonly known as:  CYMBALTA Take 90 mg by mouth daily.   esomeprazole 40 MG capsule Commonly known as:  NEXIUM TAKE 1 CAPSULE BY MOUTH AT LEAST 30 MINUTES BEFORE FOOD, ONCE DAILY FOR REFLUX.   gabapentin 100 MG capsule Commonly known as:  NEURONTIN Take 200 mg by mouth 3 (three) times daily.   GNP ARTIFICIAL TEARS 5-6 MG/ML Soln Generic drug:  Polyvinyl Alcohol-Povidone INSTILL 1 DROP INTO EACH EYE FOUR TIMES A DAY   ibuprofen 800 MG tablet Commonly known as:  ADVIL,MOTRIN Take 800 mg by mouth every 6 (six) hours as needed.   ketoconazole 2 % shampoo Commonly known as:  NIZORAL SHAMPOO HAIR 3 TIMES WEEKLY FOR FUNGAL INFECTION   ketoconazole 2 % cream Commonly known as:  NIZORAL APPLY TOPICALLY TO FACE  SPARINGLY TWICE DAILY   LORazepam 0.5 MG tablet Commonly known as:  ATIVAN Take 1 mg by mouth. 1 tablet PO for agitation greater than 10 mint, may repeat in 30 mins.   MAPAP 500 MG tablet Generic drug:  acetaminophen TAKE 2 TABLETS BY MOUTH 3 TIMES DAILY FOR PAIN   mometasone 0.1 % lotion Commonly known as:  ELOCON Apply topically daily.   orphenadrine 100 MG tablet Commonly known as:  NORFLEX Take 1 tablet (100 mg total) by mouth 2 (two) times daily.   PROMOD Liqd MIX 1 OZ (30 ML) WITH 1 OZ WATER, DRINK TWICE DAILY FOR PROTEIN SUPPLEMENT   SEROQUEL 200 MG tablet Generic drug:  QUEtiapine Take 200 mg by mouth daily. 4 pm (Dr. Tamera Punt, Psychiatry)   tamsulosin 0.4 MG Caps capsule Commonly known as:  FLOMAX TAKE 1 CAPSULE BY MOUTH DAILY   traZODone 100 MG tablet Commonly known as:  DESYREL TAKE 1 TABLET BY MOUTH DAILY AT BEDTIME FOR SLEEP            Discharge Care Instructions        Start     Ordered   07/29/17 0000  PSA     07/29/17 1114      Allergies: No Known Allergies  Family History: Family History  Problem Relation Age of Onset  . Kidney disease Neg Hx     Social History:  reports that he has quit smoking. He has never used smokeless tobacco. He reports that he does not drink alcohol or use drugs.  ROS: UROLOGY Frequent Urination?: Yes Hard to postpone urination?: No Burning/pain with urination?: No Get up at night to urinate?: Yes Leakage of urine?: No Urine stream starts and stops?: No Trouble starting stream?: No Do you have to strain to urinate?: No Blood in urine?: No Urinary tract infection?: No Sexually transmitted disease?: No Injury to kidneys or bladder?: No Painful intercourse?: No Weak stream?: No Erection problems?: No Penile pain?: No  Gastrointestinal Nausea?: No Vomiting?: No Indigestion/heartburn?: No Diarrhea?: No Constipation?: No  Constitutional Fever: No Night sweats?: No Weight loss?: No Fatigue?:  No  Skin Skin rash/lesions?: No Itching?: No  Eyes Blurred vision?: No Double vision?: No  Ears/Nose/Throat Sore throat?: No Sinus problems?: No  Hematologic/Lymphatic Swollen glands?: No Easy bruising?: No  Cardiovascular Leg swelling?: No Chest pain?: No  Respiratory Cough?:  No Shortness of breath?: No  Endocrine Excessive thirst?: No  Musculoskeletal Back pain?: Yes Joint pain?: No  Neurological Headaches?: No Dizziness?: No  Psychologic Depression?: No Anxiety?: No  Physical Exam: BP (!) 100/56   Pulse 73   Ht _0  (1.6 m)   Wt 114 lb (51.7 kg)   BMI 20.19 kg/m   GU: Patient with circumcised phallus.   Urethral meatus is patent.  No penile discharge. No penile lesions or rashes. Scrotum without lesions, cysts, rashes and/or edema.  Testicles are located scrotally bilaterally. No masses are appreciated in the testicles. Left and right epididymis are normal. Rectal: Patient with  normal sphincter tone. Perineum without scarring or rashes. No rectal masses are appreciated. Prostate is approximately 50 grams, no nodules are appreciated. Seminal vesicles are normal.   Laboratory Data: Lab Results  Component Value Date   WBC 6.0 02/25/2017   HGB 12.5 (L) 02/25/2017   HCT 38.1 (L) 02/25/2017   MCV 94.5 02/25/2017   PLT 348 02/25/2017    Lab Results  Component Value Date   CREATININE 0.80 02/25/2017     Results for orders placed or performed in visit on 02/25/17  VITAMIN D 25 Hydroxy (Vit-D Deficiency, Fractures)  Result Value Ref Range   Vit D, 25-Hydroxy 60 30 - 100 ng/mL  CBC with Differential/Platelet  Result Value Ref Range   WBC 6.0 3.8 - 10.8 K/uL   RBC 4.03 (L) 4.20 - 5.80 MIL/uL   Hemoglobin 12.5 (L) 13.2 - 17.1 g/dL   HCT 38.1 (L) 38.5 - 50.0 %   MCV 94.5 80.0 - 100.0 fL   MCH 31.0 27.0 - 33.0 pg   MCHC 32.8 32.0 - 36.0 g/dL   RDW 13.5 11.0 - 15.0 %   Platelets 348 140 - 400 K/uL   MPV 9.1 7.5 - 12.5 fL   Neutro Abs 2,880  1,500 - 7,800 cells/uL   Lymphs Abs 1,920 850 - 3,900 cells/uL   Monocytes Absolute 720 200 - 950 cells/uL   Eosinophils Absolute 420 15 - 500 cells/uL   Basophils Absolute 60 0 - 200 cells/uL   Neutrophils Relative % 48 %   Lymphocytes Relative 32 %   Monocytes Relative 12 %   Eosinophils Relative 7 %   Basophils Relative 1 %   Smear Review Criteria for review not met   Iron, TIBC and Ferritin Panel  Result Value Ref Range   Ferritin 37 20 - 380 ng/mL   Iron 64 50 - 180 ug/dL   TIBC 312 250 - 425 ug/dL   %SAT 21 15 - 60 %  COMPLETE METABOLIC PANEL WITH GFR  Result Value Ref Range   Sodium 138 135 - 146 mmol/L   Potassium 4.7 3.5 - 5.3 mmol/L   Chloride 100 98 - 110 mmol/L   CO2 30 20 - 31 mmol/L   Glucose, Bld 84 65 - 99 mg/dL   BUN 21 7 - 25 mg/dL   Creat 0.80 0.70 - 1.25 mg/dL   Total Bilirubin 0.4 0.2 - 1.2 mg/dL   Alkaline Phosphatase 121 (H) 40 - 115 U/L   AST 15 10 - 35 U/L   ALT 9 9 - 46 U/L   Total Protein 7.1 6.1 - 8.1 g/dL   Albumin 4.2 3.6 - 5.1 g/dL   Calcium 9.6 8.6 - 10.3 mg/dL   GFR, Est African American >89 >=60 mL/min   GFR, Est Non African American >89 >=60 mL/min    Previous PSA's:  0.3 ng/mL on 05/11/2014      0.3 ng/mL on 06/21/2015      0.2 ng/mL on 06/20/2016  I have reviewed the labs  Assessment & Plan:    1. BPH (benign prostatic hyperplasia) with LUTS:     Patient's IPSS score is 12/4.  His PVR 40 mL.  His DRE demonstrates enlargement.  He will continue the tamsulosin and refill was sent to his pharmacy.   He will follow up in 12 months for a PSA, DRE, PVR and an IPSS.    - PSA - BLADDER SCAN AMB NON-IMAGING   Return in about 1 year (around 07/29/2018) for IPSS and PVR.  Zara Council, [facility] [address] [place], [place] [address] [phone]

## 2017-07-26 NOTE — Progress Notes (Signed)
Name: Aaron Foster   MRN: [id number]    DOB: April 23, 1946   Date:07/26/2017       Progress Note  Subjective  Chief Complaint  Chief Complaint  Patient presents with  . Cerumen Impaction    Patient went to Audiologist to have hearing aids put in and they suggested his ears be cleaned out first.    HPI   Here today with caregiver from [facility])  Hearing loss/cerumen impaction: he is having left hearing aid placed soon and was advised by audiologist to come in to have his ear lavaged prior to insertion of new hearing aid. He denies any pain, he has chronic hearing loss. No cold symptoms, headaches or nasal congestion   Patient Active Problem List   Diagnosis Date Noted  . Vitamin D deficiency 04/08/2017  . Scoliosis 02/22/2016  . Recurrent falls 08/04/2015  . Dermatitis seborrheica 06/24/2015  . Chronic constipation 06/24/2015  . Lives in group home 06/24/2015  . Protein calorie malnutrition (HCC) 06/24/2015  . Hearing loss of both ears 06/24/2015  . Wears hearing aid 06/24/2015  . History of iron deficiency anemia 06/24/2015  . Osteoporosis 06/24/2015  . Adult behavior problem 06/24/2015  . Hiatal hernia 06/24/2015  . Hemorrhoid 06/24/2015  . Gait instability 06/24/2015  . DDD (degenerative disc disease), lumbar 06/24/2015  . Nephrolithiasis 06/24/2015  . Hyperlipidemia 06/24/2015  . Compression fracture of L1 lumbar vertebra (HCC) 06/24/2015  . Family history of upper GI bleeding   . BPH with obstruction/lower urinary tract symptoms 06/21/2015  . Chronic pain 06/19/2015  . Acid reflux 06/19/2015  . Intellectual disability 06/19/2015    Past Surgical History:  Procedure Laterality Date  . arm fracture    . circumscision      Family History  Problem Relation Age of Onset  . Kidney disease Neg Hx     Social History   Social History  . Marital status: Single    Spouse name: N/A  . Number of children: N/A  . Years of education: N/A    Occupational History  . Not on file.   Social History Main Topics  . Smoking status: Former Games developer  . Smokeless tobacco: Never Used  . Alcohol use No  . Drug use: No  . Sexual activity: Not Currently   Other Topics Concern  . Not on file   Social History Narrative  . No narrative on file     Current Outpatient Prescriptions:  .  ALPRAZolam (XANAX) 0.5 MG tablet, Take 1 tablet (0.5 mg total) by mouth at bedtime as needed for anxiety., Disp: 90 tablet, Rfl: 2 .  AMITIZA 24 MCG capsule, TAKE 1 CAPSULE BY MOUTH 2 TIMES DAILY WITH A MEAL., Disp: 60 capsule, Rfl: 5 .  ASPIR-LOW 81 MG EC tablet, TAKE 1 TABLET BY MOUTH ONCE DAILY FOR CIRCULATION, Disp: 30 tablet, Rfl: 5 .  Calcium Carbonate-Vitamin D3 600-400 MG-UNIT TABS, Take by mouth., Disp: , Rfl:  .  carboxymethylcellulose (REFRESH PLUS) 0.5 % SOLN, 1 drop 3 (three) times daily as needed., Disp: , Rfl:  .  Cholecalciferol 1000 units tablet, Take 1 tablet (1,000 Units total) by mouth daily., Disp: 90 tablet, Rfl: 1 .  DULoxetine (CYMBALTA) 30 MG capsule, Take 90 mg by mouth daily., Disp: , Rfl:  .  esomeprazole (NEXIUM) 40 MG capsule, TAKE 1 CAPSULE BY MOUTH AT LEAST 30 MINUTES BEFORE FOOD, ONCE DAILY FOR REFLUX., Disp: 30 capsule, Rfl: 5 .  gabapentin (NEURONTIN) 100 MG capsule, Take  200 mg by mouth 3 (three) times daily., Disp: , Rfl:  .  GNP ARTIFICIAL TEARS 5-6 MG/ML SOLN, INSTILL 1 DROP INTO EACH EYE FOUR TIMES A DAY, Disp: 15 mL, Rfl: 2 .  ibuprofen (ADVIL,MOTRIN) 800 MG tablet, Take 800 mg by mouth every 6 (six) hours as needed., Disp: , Rfl:  .  ketoconazole (NIZORAL) 2 % cream, APPLY TOPICALLY TO FACE SPARINGLY TWICE DAILY, Disp: 60 g, Rfl: 11 .  ketoconazole (NIZORAL) 2 % shampoo, SHAMPOO HAIR 3 TIMES WEEKLY FOR FUNGAL INFECTION, Disp: 120 mL, Rfl: 5 .  LORazepam (ATIVAN) 0.5 MG tablet, Take 1 mg by mouth. 1 tablet PO for agitation greater than 10 mint, may repeat in 30 mins., Disp: , Rfl:  .  MAPAP 500 MG tablet, TAKE 2  TABLETS BY MOUTH 3 TIMES DAILY FOR PAIN, Disp: 180 tablet, Rfl: 1 .  mometasone (ELOCON) 0.1 % lotion, Apply topically daily., Disp: , Rfl:  .  Nutritional Supplements (PROMOD) LIQD, MIX 1 OZ (30 ML) WITH 1 OZ WATER, DRINK TWICE DAILY FOR PROTEIN SUPPLEMENT, Disp: 1892 mL, Rfl: 12 .  orphenadrine (NORFLEX) 100 MG tablet, Take 1 tablet (100 mg total) by mouth 2 (two) times daily., Disp: 60 tablet, Rfl: 5 .  QUEtiapine (SEROQUEL) 200 MG tablet, Take 200 mg by mouth daily. 4 pm (Dr. Ave Filter, Psychiatry), Disp: , Rfl:  .  REFRESH 1 % ophthalmic solution, INSTILL 1 DROP INTO EACH EYE 3 TIMES DAILY FOR 30 DAYS., Disp: 15 mL, Rfl: 12 .  tamsulosin (FLOMAX) 0.4 MG CAPS capsule, TAKE 1 CAPSULE BY MOUTH DAILY, Disp: 30 capsule, Rfl: 5 .  traZODone (DESYREL) 100 MG tablet, TAKE 1 TABLET BY MOUTH DAILY AT BEDTIME FOR SLEEP, Disp: 30 tablet, Rfl: 5  No Known Allergies   ROS  Ten systems reviewed and is negative except as mentioned in HPI   Objective  Vitals:   07/26/17 0951  BP: 110/60  Pulse: (!) 110  Resp: 20  Temp: 98.2 F (36.8 C)  TempSrc: Oral  SpO2: 96%  Weight: 117 lb 4.8 oz (53.2 kg)  Height:  (1.676 m)    Body mass index is 18.93 kg/m.  Physical Exam  Constitutional: Patient appears well-developed and well-nourished. No distress.  HEENT: head atraumatic, normocephalic, pupils equal and reactive to light, ears: moderate amount of ear wax on both ear canals, he has hearing aid and was advised to have it cleaned prior to having the left hearing aid adjusted by audiologist, neck supple, throat within normal limits Cardiovascular: Normal rate, regular rhythm and normal heart sounds.  No murmur heard. No BLE edema. Pulmonary/Chest: Effort normal and breath sounds normal. No respiratory distress. Abdominal: Soft.  There is no tenderness. Psychiatric: Cooperative  PHQ2/9: Depression screen [facility] 2/9 02/25/2017 02/22/2016 06/24/2015  Decreased Interest 0 0 0  Down, Depressed,  Hopeless 0 0 0  PHQ - 2 Score 0 0 0    Fall Risk: Fall Risk  07/26/2017 05/10/2017 02/25/2017 02/22/2016 08/04/2015  Falls in the past year? Yes Yes Yes No Yes  Number falls in past yr: 2 or more 2 or more 2 or more - 2 or more  Injury with Fall? No Yes No - Yes  Risk Factor Category  - High Fall Risk - - High Fall Risk  Risk for fall due to : History of fall(s);Impaired balance/gait - - - History of fall(s);Impaired balance/gait;Impaired mobility  Follow up - - Falls evaluation completed - Falls prevention discussed  Functional Status Survey: Is the patient deaf or have difficulty hearing?: Yes Does the patient have difficulty seeing, even when wearing glasses/contacts?: Yes Does the patient have difficulty concentrating, remembering, or making decisions?: Yes Does the patient have difficulty walking or climbing stairs?: Yes Does the patient have difficulty dressing or bathing?: Yes Does the patient have difficulty doing errands alone such as visiting a doctor's office or shopping?: Yes    Assessment & Plan  1. Wears hearing aid  Having left side hearing aid placed soon and needs ear lavage first  2. Bilateral impacted cerumen  - Ear Lavage  Verbal consent given Possible side effects discussed with patient Ears were  lavaged with warm water and peroxide  Patient tolerated procedure well No complications

## 2017-07-29 ENCOUNTER — Encounter: Payer: Self-pay | Admitting: Urology

## 2017-07-29 ENCOUNTER — Ambulatory Visit (INDEPENDENT_AMBULATORY_CARE_PROVIDER_SITE_OTHER): Payer: Medicare Other | Admitting: Urology

## 2017-07-29 VITALS — BP 100/56 | HR 73 | Ht 63.0 in | Wt 114.0 lb

## 2017-07-29 DIAGNOSIS — N138 Other obstructive and reflux uropathy: Secondary | ICD-10-CM | POA: Diagnosis not present

## 2017-07-29 DIAGNOSIS — N401 Enlarged prostate with lower urinary tract symptoms: Secondary | ICD-10-CM | POA: Diagnosis not present

## 2017-07-29 MED ORDER — TAMSULOSIN HCL 0.4 MG PO CAPS: 0.4000 mg | ORAL_CAPSULE | Freq: Every day | ORAL | 5 refills | Status: DC

## 2017-07-30 ENCOUNTER — Telehealth: Payer: Self-pay

## 2017-07-30 LAB — PSA: Prostate Specific Ag, Serum: 0.3 ng/mL (ref 0.0–4.0)

## 2017-07-30 NOTE — Telephone Encounter (Signed)
Patient's caregiver notified.

## 2017-07-30 NOTE — Telephone Encounter (Signed)
-----   Message from Harle Battiest, PA-C sent at 07/30/2017  7:15 AM EDT ----- Please let Mr. Glassco care givers know that his PSA is stable at 0.3 and we will see him next year.

## 2017-08-09 DIAGNOSIS — F419 Anxiety disorder, unspecified: Secondary | ICD-10-CM | POA: Diagnosis not present

## 2017-08-14 ENCOUNTER — Encounter: Payer: Self-pay | Admitting: Family Medicine

## 2017-08-14 ENCOUNTER — Ambulatory Visit (INDEPENDENT_AMBULATORY_CARE_PROVIDER_SITE_OTHER): Payer: Medicare Other | Admitting: Family Medicine

## 2017-08-14 VITALS — BP 110/70 | HR 84 | Resp 14 | Ht 63.0 in | Wt 116.9 lb

## 2017-08-14 DIAGNOSIS — R296 Repeated falls: Secondary | ICD-10-CM | POA: Diagnosis not present

## 2017-08-14 DIAGNOSIS — G8929 Other chronic pain: Secondary | ICD-10-CM | POA: Diagnosis not present

## 2017-08-14 DIAGNOSIS — M545 Low back pain, unspecified: Secondary | ICD-10-CM

## 2017-08-14 MED ORDER — ORPHENADRINE CITRATE ER 100 MG PO TB12: 100.0000 mg | ORAL_TABLET | Freq: Every day | ORAL | 0 refills | Status: DC

## 2017-08-14 MED ORDER — ACETAMINOPHEN 500 MG PO TABS: ORAL_TABLET | ORAL | 1 refills | Status: DC

## 2017-08-14 MED ORDER — TRAZODONE HCL 50 MG PO TABS: 50.0000 mg | ORAL_TABLET | Freq: Every day | ORAL | 2 refills | Status: DC

## 2017-08-14 NOTE — Progress Notes (Signed)
Name: Aaron Foster   MRN: [id number]    DOB: 28-Dec-1945   Date:08/14/2017       Progress Note  Subjective  Chief Complaint  Chief Complaint  Patient presents with  . Fall    HPI  Recurrent Falls: he fell twice while getting up at night to void. He has nocturia. He gets up groggy from all the medications that he has been taking. No falls during the day. This past episodes were Sunday and Monday night. This time he did not go to [facility], no change in behavior, mild cut on right temporal area, and on right maxillary area,  no neuro changes.    Patient Active Problem List   Diagnosis Date Noted  . Vitamin D deficiency 04/08/2017  . Scoliosis 02/22/2016  . Recurrent falls 08/04/2015  . Dermatitis seborrheica 06/24/2015  . Chronic constipation 06/24/2015  . Lives in group home 06/24/2015  . Protein calorie malnutrition (HCC) 06/24/2015  . Hearing loss of both ears 06/24/2015  . Wears hearing aid 06/24/2015  . History of iron deficiency anemia 06/24/2015  . Osteoporosis 06/24/2015  . Adult behavior problem 06/24/2015  . Hiatal hernia 06/24/2015  . Hemorrhoid 06/24/2015  . Gait instability 06/24/2015  . DDD (degenerative disc disease), lumbar 06/24/2015  . Nephrolithiasis 06/24/2015  . Hyperlipidemia 06/24/2015  . Compression fracture of L1 lumbar vertebra (HCC) 06/24/2015  . Family history of upper GI bleeding   . BPH with obstruction/lower urinary tract symptoms 06/21/2015  . Chronic pain 06/19/2015  . Acid reflux 06/19/2015  . Intellectual disability 06/19/2015    Past Surgical History:  Procedure Laterality Date  . arm fracture    . circumscision      Family History  Problem Relation Age of Onset  . Kidney disease Neg Hx     Social History   Social History  . Marital status: Single    Spouse name: N/A  . Number of children: N/A  . Years of education: N/A   Occupational History  . Not on file.   Social History Main Topics  . Smoking status: Former Games developer   . Smokeless tobacco: Never Used  . Alcohol use No  . Drug use: No  . Sexual activity: Not Currently   Other Topics Concern  . Not on file   Social History Narrative  . No narrative on file     Current Outpatient Prescriptions:  .  AMITIZA 24 MCG capsule, TAKE 1 CAPSULE BY MOUTH 2 TIMES DAILY WITH A MEAL., Disp: 60 capsule, Rfl: 5 .  ASPIR-LOW 81 MG EC tablet, TAKE 1 TABLET BY MOUTH ONCE DAILY FOR CIRCULATION, Disp: 30 tablet, Rfl: 5 .  Calcium Carbonate-Vitamin D3 600-400 MG-UNIT TABS, Take by mouth., Disp: , Rfl:  .  carboxymethylcellulose (REFRESH PLUS) 0.5 % SOLN, 1 drop 3 (three) times daily as needed., Disp: , Rfl:  .  Cholecalciferol 1000 units tablet, Take 1 tablet (1,000 Units total) by mouth daily., Disp: 90 tablet, Rfl: 1 .  DULoxetine (CYMBALTA) 30 MG capsule, Take 90 mg by mouth daily., Disp: , Rfl:  .  esomeprazole (NEXIUM) 40 MG capsule, TAKE 1 CAPSULE BY MOUTH AT LEAST 30 MINUTES BEFORE FOOD, ONCE DAILY FOR REFLUX., Disp: 30 capsule, Rfl: 5 .  gabapentin (NEURONTIN) 100 MG capsule, Take 200 mg by mouth 3 (three) times daily., Disp: , Rfl:  .  GNP ARTIFICIAL TEARS 5-6 MG/ML SOLN, INSTILL 1 DROP INTO EACH EYE FOUR TIMES A DAY, Disp: 15 mL, Rfl: 2 .  ibuprofen (  ADVIL,MOTRIN) 800 MG tablet, Take 800 mg by mouth every 6 (six) hours as needed., Disp: , Rfl:  .  ketoconazole (NIZORAL) 2 % cream, APPLY TOPICALLY TO FACE SPARINGLY TWICE DAILY, Disp: 60 g, Rfl: 11 .  ketoconazole (NIZORAL) 2 % shampoo, SHAMPOO HAIR 3 TIMES WEEKLY FOR FUNGAL INFECTION, Disp: 120 mL, Rfl: 5 .  LORazepam (ATIVAN) 0.5 MG tablet, Take 1 mg by mouth. 1 tablet PO for agitation greater than 10 mint, may repeat in 30 mins., Disp: , Rfl:  .  mometasone (ELOCON) 0.1 % lotion, Apply topically daily., Disp: , Rfl:  .  Nutritional Supplements (PROMOD) LIQD, MIX 1 OZ (30 ML) WITH 1 OZ WATER, DRINK TWICE DAILY FOR PROTEIN SUPPLEMENT, Disp: 1892 mL, Rfl: 12 .  QUEtiapine (SEROQUEL) 200 MG tablet, Take 200 mg by  mouth daily. 4 pm (Dr. Ave Filter, Psychiatry), Disp: , Rfl:  .  REFRESH 1 % ophthalmic solution, INSTILL 1 DROP INTO EACH EYE 3 TIMES DAILY FOR 30 DAYS., Disp: 15 mL, Rfl: 12 .  tamsulosin (FLOMAX) 0.4 MG CAPS capsule, Take 1 capsule (0.4 mg total) by mouth daily., Disp: 30 capsule, Rfl: 5 .  traZODone (DESYREL) 50 MG tablet, Take 1 tablet (50 mg total) by mouth at bedtime., Disp: 30 tablet, Rfl: 2 .  acetaminophen (MAPAP) 500 MG tablet, TAKE 2 TABLETS BY MOUTH 3 TIMES DAILY FOR PAIN, Disp: 180 tablet, Rfl: 1 .  orphenadrine (NORFLEX) 100 MG tablet, Take 1 tablet (100 mg total) by mouth daily with breakfast., Disp: 30 tablet, Rfl: 0  No Known Allergies   ROS  Ten systems reviewed and is negative except as mentioned in HPI   Objective  Vitals:   08/14/17 0928  BP: 110/70  Pulse: 84  Resp: 14  SpO2: 98%  Weight: 116 lb 14.4 oz (53 kg)  Height:  (1.6 m)    Body mass index is 20.71 kg/m.  Physical Exam  Constitutional: Patient appears well-developed and very thin.  No distress.  HEENT: head atraumatic, normocephalic, pupils equal and reactive to light,  neck supple, throat within normal limits Cardiovascular: Normal rate, regular rhythm and normal heart sounds.  No murmur heard. No BLE edema. Pulmonary/Chest: Effort normal and breath sounds normal. No respiratory distress. Abdominal: Soft.  There is no tenderness. Psychiatric: cooperative, smiling Muscular Skeletal: using a walker, brace on left wrist  Recent Results (from the past 2160 hour(s))  PSA     Status: None   Collection Time: 07/29/17 11:26 AM  Result Value Ref Range   Prostate Specific Ag, Serum 0.3 0.0 - 4.0 ng/mL    Comment: Roche ECLIA methodology. According to the American Urological Association, Serum PSA should decrease and remain at undetectable levels after radical prostatectomy. The AUA defines biochemical recurrence as an initial PSA value 0.2 ng/mL or greater followed by a subsequent  confirmatory PSA value 0.2 ng/mL or greater. Values obtained with different assay methods or kits cannot be used interchangeably. Results cannot be interpreted as absolute evidence of the presence or absence of malignant disease.      PHQ2/9: Depression screen [facility] 2/9 02/25/2017 02/22/2016 06/24/2015  Decreased Interest 0 0 0  Down, Depressed, Hopeless 0 0 0  PHQ - 2 Score 0 0 0     Fall Risk: Fall Risk  08/14/2017 07/26/2017 05/10/2017 02/25/2017 02/22/2016  Falls in the past year? Yes Yes Yes Yes No  Number falls in past yr: 2 or more 2 or more 2 or more 2 or more -  Injury with Fall? No No Yes No -  Risk Factor Category  - - High Fall Risk - -  Risk for fall due to : Impaired balance/gait;Impaired mobility;Medication side effect History of fall(s);Impaired balance/gait - - -  Follow up Education provided - - Falls evaluation completed -     Assessment & Plan  1. Recurrent falls  happening at night, we will decrease trazodone, also do not give Norflex at night, too many sedating medication. He has nocturia and only falls at night when getting up to void. Also advised staff to take him to the bathroom on scheduled times so he can be assisted   2. Chronic bilateral low back pain without sciatica  - orphenadrine (NORFLEX) 100 MG tablet; Take 1 tablet (100 mg total) by mouth daily with breakfast.  Dispense: 30 tablet; Refill: 0  Decrease dose to see if helps with recurrent falls

## 2017-08-19 ENCOUNTER — Other Ambulatory Visit: Payer: Self-pay | Admitting: Family Medicine

## 2017-08-19 ENCOUNTER — Telehealth: Payer: Self-pay

## 2017-08-19 DIAGNOSIS — M62838 Other muscle spasm: Secondary | ICD-10-CM

## 2017-08-19 MED ORDER — TIZANIDINE HCL 2 MG PO CAPS: 2.0000 mg | ORAL_CAPSULE | Freq: Three times a day (TID) | ORAL | 0 refills | Status: DC | PRN

## 2017-08-19 NOTE — Telephone Encounter (Signed)
Insurance will not cover Orphenadrine for patient, but will cover Baclofen tablets and Tizanidine.  Patient insurance requires patient to try both formulary alternatives before covering Orphenadrine.

## 2017-08-19 NOTE — Telephone Encounter (Signed)
Spoke with Britta Mccreedy Aaron Foster caregiver and notified her of change with medications due to insurance.

## 2017-08-19 NOTE — Telephone Encounter (Signed)
Changed medication to Tizanidine

## 2017-08-26 ENCOUNTER — Ambulatory Visit: Payer: Self-pay | Admitting: *Deleted

## 2017-08-26 NOTE — Telephone Encounter (Signed)
Pharmacist    At    [facility]   Inquiring     About changing   Pt   zanaflex   [facility]     [facility]  [facility]

## 2017-08-28 ENCOUNTER — Other Ambulatory Visit: Payer: Self-pay | Admitting: Family Medicine

## 2017-08-28 DIAGNOSIS — M62838 Other muscle spasm: Secondary | ICD-10-CM

## 2017-08-28 MED ORDER — TIZANIDINE HCL 2 MG PO TABS: 2.0000 mg | ORAL_TABLET | Freq: Three times a day (TID) | ORAL | 0 refills | Status: DC | PRN

## 2017-08-29 ENCOUNTER — Telehealth: Payer: Self-pay | Admitting: Family Medicine

## 2017-08-29 NOTE — Telephone Encounter (Signed)
Copied from CRM #[id number]. Topic: Quick Communication - See Telephone Encounter >> Aug 29, 2017 11:50 AM Everardo PacificMoton, Arius Harnois, NT wrote: CRM for notification. See Telephone encounter for:  08/29/17. Patient is having behavioral problems with his medication Trazodone. I have scheduled the patient an appointment for Monday Oct 29/2018 @11 :40am

## 2017-09-02 ENCOUNTER — Encounter: Payer: Self-pay | Admitting: Family Medicine

## 2017-09-02 ENCOUNTER — Ambulatory Visit (INDEPENDENT_AMBULATORY_CARE_PROVIDER_SITE_OTHER): Payer: Medicare Other | Admitting: Family Medicine

## 2017-09-02 VITALS — Temp 98.2°F | Resp 18 | Ht 63.0 in | Wt 118.7 lb

## 2017-09-02 DIAGNOSIS — R451 Restlessness and agitation: Secondary | ICD-10-CM | POA: Diagnosis not present

## 2017-09-02 DIAGNOSIS — R4689 Other symptoms and signs involving appearance and behavior: Secondary | ICD-10-CM

## 2017-09-02 MED ORDER — ORPHENADRINE CITRATE ER 100 MG PO TB12: 100.0000 mg | ORAL_TABLET | Freq: Two times a day (BID) | ORAL | 0 refills | Status: DC

## 2017-09-02 NOTE — Progress Notes (Signed)
Name: Aaron Foster   MRN: [id number]    DOB: 03/15/1946   Date:09/02/2017       Progress Note  Subjective  Chief Complaint  Chief Complaint  Patient presents with  . Aggressive Behavior    Patient has been very aggravated, kicking, fighting staff, spitting at people and throwing his walker with Trazodone. Also has been scratching other staff and upset with everyone.     HPI  Change in behavior: since he stopped taking Norflex ( switched to Tizanidine because of insurance coverage he has been agitated, getting aggressive - kicking, trying to bite and scratch the staff at the group home. He wakes up during the night and talks to his pictures. He sees Aaron Foster. No change in appetite, caregivers, no urinary frequency or change in bowel movements. No rashes, no cough or SOB. Only change was the medication. We will try PA and go back to Norflex but also needs to follow up with Aaron Foster.   He was brought today by Aaron Foster today    Patient Active Problem List   Diagnosis Date Noted  . Vitamin D deficiency 04/08/2017  . Scoliosis 02/22/2016  . Recurrent falls 08/04/2015  . Dermatitis seborrheica 06/24/2015  . Chronic constipation 06/24/2015  . Lives in group home 06/24/2015  . Protein calorie malnutrition (HCC) 06/24/2015  . Hearing loss of both ears 06/24/2015  . Wears hearing aid 06/24/2015  . History of iron deficiency anemia 06/24/2015  . Osteoporosis 06/24/2015  . Adult behavior problem 06/24/2015  . Hiatal hernia 06/24/2015  . Hemorrhoid 06/24/2015  . Gait instability 06/24/2015  . DDD (degenerative disc disease), lumbar 06/24/2015  . Nephrolithiasis 06/24/2015  . Hyperlipidemia 06/24/2015  . Compression fracture of L1 lumbar vertebra (HCC) 06/24/2015  . Family history of upper GI bleeding   . BPH with obstruction/lower urinary tract symptoms 06/21/2015  . Chronic pain 06/19/2015  . Acid reflux 06/19/2015  . Intellectual disability 06/19/2015    Past  Surgical History:  Procedure Laterality Date  . arm fracture    . circumscision      Family History  Problem Relation Age of Onset  . Kidney disease Neg Hx     Social History   Social History  . Marital status: Single    Spouse name: N/A  . Number of children: N/A  . Years of education: N/A   Occupational History  . Not on file.   Social History Main Topics  . Smoking status: Former Games developer  . Smokeless tobacco: Never Used  . Alcohol use No  . Drug use: No  . Sexual activity: Not Currently   Other Topics Concern  . Not on file   Social History Narrative  . No narrative on file     Current Outpatient Prescriptions:  .  acetaminophen (MAPAP) 500 MG tablet, TAKE 2 TABLETS BY MOUTH 3 TIMES DAILY FOR PAIN, Disp: 180 tablet, Rfl: 1 .  AMITIZA 24 MCG capsule, TAKE 1 CAPSULE BY MOUTH 2 TIMES DAILY WITH A MEAL., Disp: 60 capsule, Rfl: 5 .  ASPIR-LOW 81 MG EC tablet, TAKE 1 TABLET BY MOUTH ONCE DAILY FOR CIRCULATION, Disp: 30 tablet, Rfl: 5 .  Calcium Carbonate-Vitamin D3 600-400 MG-UNIT TABS, Take by mouth., Disp: , Rfl:  .  carboxymethylcellulose (REFRESH PLUS) 0.5 % SOLN, 1 drop 3 (three) times daily as needed., Disp: , Rfl:  .  Cholecalciferol 1000 units tablet, Take 1 tablet (1,000 Units total) by mouth daily., Disp: 90 tablet, Rfl: 1 .  DULoxetine (CYMBALTA) 30 MG capsule, Take 90 mg by mouth daily., Disp: , Rfl:  .  esomeprazole (NEXIUM) 40 MG capsule, TAKE 1 CAPSULE BY MOUTH AT LEAST 30 MINUTES BEFORE FOOD, ONCE DAILY FOR REFLUX., Disp: 30 capsule, Rfl: 5 .  gabapentin (NEURONTIN) 100 MG capsule, Take 200 mg by mouth 3 (three) times daily., Disp: , Rfl:  .  GNP ARTIFICIAL TEARS 5-6 MG/ML SOLN, INSTILL 1 DROP INTO EACH EYE FOUR TIMES A DAY, Disp: 15 mL, Rfl: 2 .  ibuprofen (ADVIL,MOTRIN) 800 MG tablet, Take 800 mg by mouth every 6 (six) hours as needed., Disp: , Rfl:  .  ketoconazole (NIZORAL) 2 % cream, APPLY TOPICALLY TO FACE SPARINGLY TWICE DAILY, Disp: 60 g, Rfl:  11 .  ketoconazole (NIZORAL) 2 % shampoo, SHAMPOO HAIR 3 TIMES WEEKLY FOR FUNGAL INFECTION, Disp: 120 mL, Rfl: 5 .  LORazepam (ATIVAN) 0.5 MG tablet, Take 1 mg by mouth. 1 tablet PO for agitation greater than 10 mint, may repeat in 30 mins., Disp: , Rfl:  .  mometasone (ELOCON) 0.1 % lotion, Apply topically daily., Disp: , Rfl:  .  Nutritional Supplements (PROMOD) LIQD, MIX 1 OZ (30 ML) WITH 1 OZ WATER, DRINK TWICE DAILY FOR PROTEIN SUPPLEMENT, Disp: 1892 mL, Rfl: 12 .  QUEtiapine (SEROQUEL) 200 MG tablet, Take 200 mg by mouth daily. 4 pm (Dr. Ave Filterhandler, Psychiatry), Disp: , Rfl:  .  REFRESH 1 % ophthalmic solution, INSTILL 1 DROP INTO EACH EYE 3 TIMES DAILY FOR 30 DAYS., Disp: 15 mL, Rfl: 12 .  tamsulosin (FLOMAX) 0.4 MG CAPS capsule, Take 1 capsule (0.4 mg total) by mouth daily., Disp: 30 capsule, Rfl: 5 .  traZODone (DESYREL) 50 MG tablet, Take 1 tablet (50 mg total) by mouth at bedtime., Disp: 30 tablet, Rfl: 2 .  orphenadrine (NORFLEX) 100 MG tablet, Take 1 tablet (100 mg total) by mouth 2 (two) times daily., Disp: 60 tablet, Rfl: 0  No Known Allergies   ROS  Ten systems reviewed and is negative except as mentioned in HPI  Objective  Vitals:   09/02/17 1206  Resp: 18  Temp: 98.2 F (36.8 C)  TempSrc: Oral  SpO2: 97%  Weight: 118 lb 11.2 oz (53.8 kg)  Height: 5\' 3"  (1.6 m)    Body mass index is 21.03 kg/m.  Physical Exam  Constitutional: Patient appears well-developed and very thin.  No distress.  HEENT: head atraumatic, normocephalic, pupils equal and reactive to light,  neck supple, throat within normal limits Cardiovascular: Normal rate, regular rhythm and normal heart sounds.  No murmur heard. No BLE edema. Pulmonary/Chest: Effort normal and breath sounds normal. No respiratory distress. Abdominal: Soft.  There is no tenderness. Psychiatric: cooperative, smiling, he was moving a little more than usual, but not agitated during visit Muscular Skeletal: using a walker,  brace on left wrist  Recent Results (from the past 2160 hour(s))  PSA     Status: None   Collection Time: 07/29/17 11:26 AM  Result Value Ref Range   Prostate Specific Ag, Serum 0.3 0.0 - 4.0 ng/mL    Comment: Roche ECLIA methodology. According to the American Urological Association, Serum PSA should decrease and remain at undetectable levels after radical prostatectomy. The AUA defines biochemical recurrence as an initial PSA value 0.2 ng/mL or greater followed by a subsequent confirmatory PSA value 0.2 ng/mL or greater. Values obtained with different assay methods or kits cannot be used interchangeably. Results cannot be interpreted as absolute evidence of the presence or absence  of malignant disease.      PHQ2/9: Depression screen [facility] 2/9 02/25/2017 02/22/2016 06/24/2015  Decreased Interest 0 0 0  Down, Depressed, Hopeless 0 0 0  PHQ - 2 Score 0 0 0     Fall Risk: Fall Risk  08/14/2017 07/26/2017 05/10/2017 02/25/2017 02/22/2016  Falls in the past year? Yes Yes Yes Yes No  Number falls in past yr: 2 or more 2 or more 2 or more 2 or more -  Injury with Fall? No No Yes No -  Risk Factor Category  - - High Fall Risk - -  Risk for fall due to : Impaired balance/gait;Impaired mobility;Medication side effect History of fall(s);Impaired balance/gait - - -  Follow up Education provided - - Falls evaluation completed -      Assessment & Plan  1. Behavioral change  It started once we stopped medication and Tizanidine is not working since he started that medication, getting agitated day and night, explained that we will need to check for other causes of change in behavior if no improvement with Norflex - orphenadrine (NORFLEX) 100 MG tablet; Take 1 tablet (100 mg total) by mouth 2 (two) times daily.  Dispense: 60 tablet; Refill: 0  Also advised to follow up with Aaron Foster  2. Agitation  See above

## 2017-09-10 DIAGNOSIS — F419 Anxiety disorder, unspecified: Secondary | ICD-10-CM | POA: Diagnosis not present

## 2017-10-08 ENCOUNTER — Other Ambulatory Visit: Payer: Self-pay

## 2017-10-08 DIAGNOSIS — E559 Vitamin D deficiency, unspecified: Secondary | ICD-10-CM

## 2017-10-08 MED ORDER — CHOLECALCIFEROL 25 MCG (1000 UT) PO TABS: 1000.0000 [IU] | ORAL_TABLET | Freq: Every day | ORAL | 2 refills | Status: DC

## 2017-10-08 NOTE — Telephone Encounter (Signed)
Got a fax from [facility] requesting a 90 day refill of this patient's Vitamin D3.  Refill request was sent to Maurice SmallEmily boyce, FNP for approval and submission.

## 2017-10-25 ENCOUNTER — Ambulatory Visit: Payer: Medicare Other | Admitting: Family Medicine

## 2017-11-06 ENCOUNTER — Other Ambulatory Visit: Payer: Self-pay | Admitting: Family Medicine

## 2017-11-06 ENCOUNTER — Other Ambulatory Visit: Payer: Self-pay

## 2017-11-06 DIAGNOSIS — N401 Enlarged prostate with lower urinary tract symptoms: Secondary | ICD-10-CM

## 2017-11-06 DIAGNOSIS — R269 Unspecified abnormalities of gait and mobility: Secondary | ICD-10-CM

## 2017-11-06 DIAGNOSIS — K5909 Other constipation: Secondary | ICD-10-CM

## 2017-11-06 DIAGNOSIS — R4689 Other symptoms and signs involving appearance and behavior: Secondary | ICD-10-CM

## 2017-11-06 NOTE — Telephone Encounter (Addendum)
[facility] requesting 90-day orders to be signed. Form has been signed and placed at the front for pick-up. Called Britta MccreedyBarbara to tell her to come back and pick up form,

## 2017-11-18 ENCOUNTER — Encounter: Payer: Self-pay | Admitting: Family Medicine

## 2017-11-18 ENCOUNTER — Ambulatory Visit (INDEPENDENT_AMBULATORY_CARE_PROVIDER_SITE_OTHER): Payer: Medicare Other | Admitting: Family Medicine

## 2017-11-18 VITALS — BP 120/80 | HR 94 | Resp 16 | Ht 63.0 in | Wt 114.7 lb

## 2017-11-18 DIAGNOSIS — M545 Low back pain, unspecified: Secondary | ICD-10-CM

## 2017-11-18 DIAGNOSIS — Z593 Problems related to living in residential institution: Secondary | ICD-10-CM

## 2017-11-18 DIAGNOSIS — G8929 Other chronic pain: Secondary | ICD-10-CM

## 2017-11-18 DIAGNOSIS — L219 Seborrheic dermatitis, unspecified: Secondary | ICD-10-CM

## 2017-11-18 DIAGNOSIS — Z789 Other specified health status: Secondary | ICD-10-CM

## 2017-11-18 DIAGNOSIS — R451 Restlessness and agitation: Secondary | ICD-10-CM | POA: Diagnosis not present

## 2017-11-18 DIAGNOSIS — R2681 Unsteadiness on feet: Secondary | ICD-10-CM | POA: Diagnosis not present

## 2017-11-18 DIAGNOSIS — G894 Chronic pain syndrome: Secondary | ICD-10-CM | POA: Diagnosis not present

## 2017-11-18 DIAGNOSIS — E440 Moderate protein-calorie malnutrition: Secondary | ICD-10-CM | POA: Diagnosis not present

## 2017-11-18 MED ORDER — ENSURE COMPLETE PO LIQD: 237.0000 mL | Freq: Two times a day (BID) | ORAL | 11 refills | Status: DC

## 2017-11-18 NOTE — Progress Notes (Signed)
Name: Aaron Foster   MRN: [id number]    DOB: 1946-07-13   Date:11/18/2017       Progress Note  Subjective  Chief Complaint  Chief Complaint  Patient presents with  . Agitation  . Constipation  . Gastroesophageal Reflux   Here today with : Drue Stager and [facility]  HPI  Hearing loss: sees Dr. Andee Poles, he is not wearing his right hearing aid today, he misplaces it around the group home.   Protein Calorie malnutrition: he is pick eater he is taking protein supplement three times a day, he has lost another 4 lbs since last visit, advised to let him have desserts, we will add Ensure twice daily , one of them can be before bed  Chronic pain: has a brace on left wrist and usually on his right knee ( but not wearing it today) , had injections on right knee and seems to be doing better. He has chronic back pain and is  taking Norflex. He uses his walker. [name] complaining of left shoulder now, but normal rom and no recent falls.   Depression: he is doing better, seeing Dr. Ave Filter, enjoys singing and dancing at the day program, more cooperative but he still has episodes of aggression at the group home. He throws his walker when upset.   Gait instability: using a walker, he had PT many times in the past.   Anemia: of chronic disease, last labs stable, no SOB.   Chronic constipation: taking medication and has regular bowel movements, every other day, no straining or pain. Taking prune juice   Dermatitis seborrheic:using medication, stable at this time.    Patient Active Problem List   Diagnosis Date Noted  . Vitamin D deficiency 04/08/2017  . Scoliosis 02/22/2016  . Recurrent falls 08/04/2015  . Dermatitis seborrheica 06/24/2015  . Chronic constipation 06/24/2015  . Lives in group home 06/24/2015  . Protein calorie malnutrition (HCC) 06/24/2015  . Hearing loss of both ears 06/24/2015  . Wears hearing aid 06/24/2015  . History of iron deficiency anemia 06/24/2015  .  Osteoporosis 06/24/2015  . Adult behavior problem 06/24/2015  . Hiatal hernia 06/24/2015  . Hemorrhoid 06/24/2015  . Gait instability 06/24/2015  . DDD (degenerative disc disease), lumbar 06/24/2015  . Nephrolithiasis 06/24/2015  . Hyperlipidemia 06/24/2015  . Compression fracture of L1 lumbar vertebra (HCC) 06/24/2015  . Family history of upper GI bleeding   . BPH with obstruction/lower urinary tract symptoms 06/21/2015  . Chronic pain 06/19/2015  . Acid reflux 06/19/2015  . Intellectual disability 06/19/2015    Past Surgical History:  Procedure Laterality Date  . arm fracture    . circumscision      Family History  Problem Relation Age of Onset  . Kidney disease Neg Hx     Social History   Socioeconomic History  . Marital status: Single    Spouse name: Not on file  . Number of children: Not on file  . Years of education: Not on file  . Highest education level: Not on file  Social Needs  . Financial resource strain: Not on file  . Food insecurity - worry: Not on file  . Food insecurity - inability: Not on file  . Transportation needs - medical: Not on file  . Transportation needs - non-medical: Not on file  Occupational History  . Not on file  Tobacco Use  . Smoking status: Former Games developer  . Smokeless tobacco: Never Used  Substance and Sexual Activity  .  Alcohol use: No    Alcohol/week: 0.0 oz  . Drug use: No  . Sexual activity: Not Currently  Other Topics Concern  . Not on file  Social History Narrative  . Not on file     Current Outpatient Medications:  .  acetaminophen (MAPAP) 500 MG tablet, TAKE 2 TABLETS BY MOUTH 3 TIMES DAILY FOR PAIN, Disp: 180 tablet, Rfl: 1 .  AMITIZA 24 MCG capsule, TAKE 1 CAPSULE BY MOUTH 2 TIMES DAILY WITH A MEAL., Disp: 60 capsule, Rfl: 5 .  ASPIR-LOW 81 MG EC tablet, TAKE 1 TABLET BY MOUTH ONCE DAILY FOR CIRCULATION, Disp: 30 tablet, Rfl: 5 .  Calcium Carbonate-Vitamin D3 600-400 MG-UNIT TABS, Take by mouth., Disp: , Rfl:   .  carboxymethylcellulose (REFRESH PLUS) 0.5 % SOLN, 1 drop 3 (three) times daily as needed., Disp: , Rfl:  .  Cholecalciferol 1000 units tablet, Take 1 tablet (1,000 Units total) by mouth daily., Disp: 90 tablet, Rfl: 2 .  DULoxetine (CYMBALTA) 30 MG capsule, Take 90 mg by mouth daily., Disp: , Rfl:  .  esomeprazole (NEXIUM) 40 MG capsule, TAKE 1 CAPSULE BY MOUTH AT LEAST 30 MINUTES BEFORE FOOD, ONCE DAILY FOR REFLUX., Disp: 30 capsule, Rfl: 5 .  gabapentin (NEURONTIN) 100 MG capsule, Take 200 mg by mouth 3 (three) times daily., Disp: , Rfl:  .  GNP ARTIFICIAL TEARS 5-6 MG/ML SOLN, INSTILL 1 DROP INTO EACH EYE FOUR TIMES A DAY, Disp: 15 mL, Rfl: 2 .  ibuprofen (ADVIL,MOTRIN) 800 MG tablet, Take 800 mg by mouth every 6 (six) hours as needed., Disp: , Rfl:  .  ketoconazole (NIZORAL) 2 % cream, APPLY TOPICALLY TO FACE SPARINGLY TWICE DAILY, Disp: 60 g, Rfl: 11 .  ketoconazole (NIZORAL) 2 % shampoo, SHAMPOO HAIR 3 TIMES WEEKLY FOR FUNGAL INFECTION, Disp: 120 mL, Rfl: 5 .  LORazepam (ATIVAN) 0.5 MG tablet, Take 1 mg by mouth. 1 tablet PO for agitation greater than 10 mint, may repeat in 30 mins., Disp: , Rfl:  .  mometasone (ELOCON) 0.1 % lotion, Apply topically daily., Disp: , Rfl:  .  Nutritional Supplements (PROMOD) LIQD, MIX 1 OZ (30 ML) WITH 1 OZ WATER, DRINK TWICE DAILY FOR PROTEIN SUPPLEMENT, Disp: 1892 mL, Rfl: 12 .  orphenadrine (NORFLEX) 100 MG tablet, Take 1 tablet (100 mg total) by mouth 2 (two) times daily., Disp: 60 tablet, Rfl: 0 .  QUEtiapine (SEROQUEL) 200 MG tablet, Take 200 mg by mouth daily. 4 pm (Dr. Ave Filterhandler, Psychiatry), Disp: , Rfl:  .  REFRESH 1 % ophthalmic solution, INSTILL 1 DROP INTO EACH EYE 3 TIMES DAILY FOR 30 DAYS., Disp: 15 mL, Rfl: 12 .  tamsulosin (FLOMAX) 0.4 MG CAPS capsule, Take 1 capsule (0.4 mg total) by mouth daily., Disp: 30 capsule, Rfl: 5 .  traZODone (DESYREL) 50 MG tablet, Take 1 tablet (50 mg total) by mouth at bedtime., Disp: 30 tablet, Rfl: 2  No  Known Allergies   ROS  Ten systems reviewed and is negative except as mentioned in HPI   Objective  Vitals:   11/18/17 1013  BP: 120/80  Pulse: 94  Resp: 16  SpO2: 97%  Weight: 114 lb 11.2 oz (52 kg)  Height: 5\' 3"  (1.6 m)    Body mass index is 20.32 kg/m.  Physical Exam  Constitutional: Patient appears extremely thin. No distress.  HEENT: head atraumatic, normocephalic, pupils equal and reactive to light, ears : hearing aid on right side, neck supple, throat within normal limits Cardiovascular: Normal  rate, regular rhythm and normal heart sounds.  No murmur heard. No BLE edema. Pulmonary/Chest: Effort normal and breath sounds normal. No respiratory distress. Abdominal: Soft.  There is no tenderness. Psychiatric: Cooperative, he sang and was pretending to play the guitar  Muscular Skeletal: brace on left wrist, normal rom of both shoulders, mild crepitus of left knee, no pain during palpation of his back  today   PHQ2/9: Depression screen [facility] 2/9 02/25/2017 02/22/2016 06/24/2015  Decreased Interest 0 0 0  Down, Depressed, Hopeless 0 0 0  PHQ - 2 Score 0 0 0     Fall Risk: Fall Risk  11/18/2017 08/14/2017 07/26/2017 05/10/2017 02/25/2017  Falls in the past year? No Yes Yes Yes Yes  Number falls in past yr: - 2 or more 2 or more 2 or more 2 or more  Injury with Fall? - No No Yes No  Risk Factor Category  - - - High Fall Risk -  Risk for fall due to : - Impaired balance/gait;Impaired mobility;Medication side effect History of fall(s);Impaired balance/gait - -  Follow up - Education provided - - Falls evaluation completed     Functional Status Survey: Is the patient deaf or have difficulty hearing?: No Does the patient have difficulty seeing, even when wearing glasses/contacts?: No Does the patient have difficulty concentrating, remembering, or making decisions?: No Does the patient have difficulty walking or climbing stairs?: No Does the patient have difficulty dressing  or bathing?: No Does the patient have difficulty doing errands alone such as visiting a doctor's office or shopping?: No   Assessment & Plan   1. Moderate protein-calorie malnutrition (HCC)  - feeding supplement, ENSURE COMPLETE, (ENSURE COMPLETE) LIQD; Take 237 mLs by mouth 2 (two) times daily between meals.  Dispense: 237 mL; Refill: 11  2. Agitation  Stable   3. Chronic bilateral low back pain without sciatica  Stable   4. Lives in group home  [place]   5. Gait instability   6. Dermatitis seborrheica   7. Chronic pain syndrome  He still has medications

## 2017-11-19 ENCOUNTER — Telehealth: Payer: Self-pay

## 2017-11-19 DIAGNOSIS — F419 Anxiety disorder, unspecified: Secondary | ICD-10-CM | POA: Diagnosis not present

## 2017-11-19 NOTE — Telephone Encounter (Signed)
Pharmacist from [facility] called regarding Ensure Complete stating it was not covered by Insurance and very expensive for the patient. So he wanted to clarified if Dr. Carlynn PurlSowles mented for the patient to have two protein liquids to a increase risk of accidents (diarrhea) from all the milk in each product.

## 2017-12-10 DIAGNOSIS — F419 Anxiety disorder, unspecified: Secondary | ICD-10-CM | POA: Diagnosis not present

## 2017-12-11 ENCOUNTER — Other Ambulatory Visit: Payer: Self-pay | Admitting: Neurology

## 2017-12-11 DIAGNOSIS — R0683 Snoring: Secondary | ICD-10-CM | POA: Diagnosis not present

## 2017-12-11 DIAGNOSIS — R269 Unspecified abnormalities of gait and mobility: Secondary | ICD-10-CM

## 2017-12-11 DIAGNOSIS — R4689 Other symptoms and signs involving appearance and behavior: Secondary | ICD-10-CM | POA: Diagnosis not present

## 2017-12-26 ENCOUNTER — Ambulatory Visit
Admission: RE | Admit: 2017-12-26 | Discharge: 2017-12-26 | Disposition: A | Discharge status: Home / Self Care | Payer: Medicare Other | Source: Ambulatory Visit | Attending: Neurology | Admitting: Neurology

## 2017-12-26 DIAGNOSIS — R4689 Other symptoms and signs involving appearance and behavior: Secondary | ICD-10-CM | POA: Insufficient documentation

## 2017-12-26 DIAGNOSIS — R269 Unspecified abnormalities of gait and mobility: Secondary | ICD-10-CM | POA: Insufficient documentation

## 2017-12-26 DIAGNOSIS — G319 Degenerative disease of nervous system, unspecified: Secondary | ICD-10-CM | POA: Diagnosis not present

## 2017-12-26 DIAGNOSIS — R0683 Snoring: Secondary | ICD-10-CM | POA: Insufficient documentation

## 2017-12-26 DIAGNOSIS — R4182 Altered mental status, unspecified: Secondary | ICD-10-CM | POA: Diagnosis not present

## 2017-12-30 ENCOUNTER — Other Ambulatory Visit: Payer: Self-pay | Admitting: Family Medicine

## 2017-12-30 NOTE — Telephone Encounter (Signed)
Refill request for general medication. Refresh Tear Drops   Last office visit: 11/18/2017   Follow up on 03/18/2018

## 2018-01-01 ENCOUNTER — Ambulatory Visit: Payer: Medicare Other | Attending: Neurology

## 2018-01-01 DIAGNOSIS — G4733 Obstructive sleep apnea (adult) (pediatric): Secondary | ICD-10-CM | POA: Diagnosis not present

## 2018-01-01 DIAGNOSIS — R0683 Snoring: Secondary | ICD-10-CM | POA: Insufficient documentation

## 2018-01-05 ENCOUNTER — Other Ambulatory Visit: Payer: Self-pay | Admitting: Urology

## 2018-01-05 DIAGNOSIS — N401 Enlarged prostate with lower urinary tract symptoms: Secondary | ICD-10-CM

## 2018-03-04 ENCOUNTER — Other Ambulatory Visit: Payer: Self-pay | Admitting: Family Medicine

## 2018-03-04 DIAGNOSIS — K5909 Other constipation: Secondary | ICD-10-CM

## 2018-03-18 ENCOUNTER — Encounter: Payer: Self-pay | Admitting: Family Medicine

## 2018-03-18 ENCOUNTER — Ambulatory Visit (INDEPENDENT_AMBULATORY_CARE_PROVIDER_SITE_OTHER): Payer: Medicare Other | Admitting: Family Medicine

## 2018-03-18 VITALS — BP 120/80 | HR 88 | Resp 16 | Ht 63.0 in | Wt 119.6 lb

## 2018-03-18 DIAGNOSIS — E559 Vitamin D deficiency, unspecified: Secondary | ICD-10-CM

## 2018-03-18 DIAGNOSIS — K5909 Other constipation: Secondary | ICD-10-CM

## 2018-03-18 DIAGNOSIS — Z9181 History of falling: Secondary | ICD-10-CM | POA: Diagnosis not present

## 2018-03-18 DIAGNOSIS — G4733 Obstructive sleep apnea (adult) (pediatric): Secondary | ICD-10-CM

## 2018-03-18 DIAGNOSIS — E7800 Pure hypercholesterolemia, unspecified: Secondary | ICD-10-CM | POA: Diagnosis not present

## 2018-03-18 DIAGNOSIS — R2681 Unsteadiness on feet: Secondary | ICD-10-CM | POA: Diagnosis not present

## 2018-03-18 DIAGNOSIS — R269 Unspecified abnormalities of gait and mobility: Secondary | ICD-10-CM

## 2018-03-18 DIAGNOSIS — M545 Low back pain, unspecified: Secondary | ICD-10-CM

## 2018-03-18 DIAGNOSIS — E441 Mild protein-calorie malnutrition: Secondary | ICD-10-CM | POA: Diagnosis not present

## 2018-03-18 DIAGNOSIS — Z974 Presence of external hearing-aid: Secondary | ICD-10-CM | POA: Diagnosis not present

## 2018-03-18 DIAGNOSIS — G894 Chronic pain syndrome: Secondary | ICD-10-CM

## 2018-03-18 DIAGNOSIS — G8929 Other chronic pain: Secondary | ICD-10-CM

## 2018-03-18 DIAGNOSIS — D638 Anemia in other chronic diseases classified elsewhere: Secondary | ICD-10-CM

## 2018-03-18 DIAGNOSIS — S0081XA Abrasion of other part of head, initial encounter: Secondary | ICD-10-CM | POA: Diagnosis not present

## 2018-03-18 DIAGNOSIS — Z8639 Personal history of other endocrine, nutritional and metabolic disease: Secondary | ICD-10-CM | POA: Diagnosis not present

## 2018-03-18 NOTE — Progress Notes (Signed)
Name: Aaron Foster   MRN: [id number]    DOB: [birth year]   Date:03/18/2018       Progress Note  Subjective  Chief Complaint  Chief Complaint  Patient presents with  . Fall  . Hyperlipidemia    HPI  Today he came in with : Aaron Foster    Hearing loss: sees Dr. Andee Poles, he is not wearing his right hearing aid today, he misplaces it around the group home.   Protein Calorie malnutrition: he is pick eater he is taking protein supplement three times a day, he gained  4 lbs since last visit, he has been eating dessert and taking Ensure three times daily and is doing well, good appetite, recheck labs  Chronic pain: has a brace on left wrist and usually on his right knee ( but not wearing it today) , had injections on right knee and seems to be doing better. He has chronic back pain and istaking Norflex. He uses his walker. Not complaining today   Depression: his behavior is better at group home and seems happy today, not complaining today, seeing Dr. Ave Filter, enjoys singing and dancing at the day program, more cooperative but he still has episodes of aggression at the group home.   Gait instability: using a walker, he had PT many times in the past.  Anemia:of chronic disease, last labs stable, no SOB. It has been one year and we will recheck today   Chronic constipation: taking medication and has regular bowel movements, every other day, no straining or pain. Taking prune juice, caregiver not sure if he is taking Amitiza at this time, medication not brought today   Dermatitis seborrheic:using medication, not present at this time  OSA: found results on Dr. Margaretmary Eddy visit, he is not wearing CPAP , advised caregivers to contact his office, falls asleep on his chair and snores at night   Patient Active Problem List   Diagnosis Date Noted  . Vitamin D deficiency 04/08/2017  . Scoliosis 02/22/2016  . Recurrent falls 08/04/2015  . Dermatitis seborrheica  06/24/2015  . Chronic constipation 06/24/2015  . Lives in group home 06/24/2015  . Protein calorie malnutrition (HCC) 06/24/2015  . Hearing loss of both ears 06/24/2015  . Wears hearing aid 06/24/2015  . History of iron deficiency anemia 06/24/2015  . Osteoporosis 06/24/2015  . Adult behavior problem 06/24/2015  . Hiatal hernia 06/24/2015  . Hemorrhoid 06/24/2015  . Gait instability 06/24/2015  . DDD (degenerative disc disease), lumbar 06/24/2015  . Nephrolithiasis 06/24/2015  . Hyperlipidemia 06/24/2015  . Compression fracture of L1 lumbar vertebra (HCC) 06/24/2015  . Family history of upper GI bleeding   . BPH with obstruction/lower urinary tract symptoms 06/21/2015  . Chronic pain 06/19/2015  . Acid reflux 06/19/2015  . Intellectual disability 06/19/2015    Past Surgical History:  Procedure Laterality Date  . arm fracture    . circumscision      Family History  Problem Relation Age of Onset  . Kidney disease Neg Hx     Social History   Socioeconomic History  . Marital status: Single    Spouse name: Not on file  . Number of children: Not on file  . Years of education: Not on file  . Highest education level: Not on file  Occupational History  . Not on file  Social Needs  . Financial resource strain: Not on file  . Food insecurity:    Worry: Not on file    Inability: Not  on file  . Transportation needs:    Medical: Not on file    Non-medical: Not on file  Tobacco Use  . Smoking status: Former Games developer  . Smokeless tobacco: Never Used  Substance and Sexual Activity  . Alcohol use: No    Alcohol/week: 0.0 oz  . Drug use: No  . Sexual activity: Not Currently  Lifestyle  . Physical activity:    Days per week: Not on file    Minutes per session: Not on file  . Stress: Not on file  Relationships  . Social connections:    Talks on phone: Not on file    Gets together: Not on file    Attends religious service: Not on file    Active member of club or  organization: Not on file    Attends meetings of clubs or organizations: Not on file    Relationship status: Not on file  . Intimate partner violence:    Fear of current or ex partner: Not on file    Emotionally abused: Not on file    Physically abused: Not on file    Forced sexual activity: Not on file  Other Topics Concern  . Not on file  Social History Narrative  . Not on file     Current Outpatient Medications:  .  acetaminophen (MAPAP) 500 MG tablet, TAKE 2 TABLETS BY MOUTH 3 TIMES DAILY FOR PAIN, Disp: 180 tablet, Rfl: 1 .  AMITIZA 24 MCG capsule, TAKE 1 CAPSULE BY MOUTH 2 TIMES DAILY WITH A MEAL., Disp: 60 capsule, Rfl: 5 .  Calcium Carbonate-Vitamin D3 600-400 MG-UNIT TABS, Take by mouth., Disp: , Rfl:  .  Cholecalciferol 1000 units tablet, Take 1 tablet (1,000 Units total) by mouth daily., Disp: 90 tablet, Rfl: 2 .  DULoxetine (CYMBALTA) 30 MG capsule, Take 90 mg by mouth daily., Disp: , Rfl:  .  esomeprazole (NEXIUM) 40 MG capsule, TAKE 1 CAPSULE BY MOUTH AT LEAST 30 MINUTES BEFORE FOOD, ONCE DAILY FOR REFLUX., Disp: 30 capsule, Rfl: 5 .  gabapentin (NEURONTIN) 100 MG capsule, Take 200 mg by mouth 3 (three) times daily., Disp: , Rfl:  .  GNP ASPIRIN LOW DOSE 81 MG EC tablet, TAKE 1 TABLET BY MOUTH ONCE DAILY FOR CIRCULATION, Disp: 30 tablet, Rfl: 5 .  ibuprofen (ADVIL,MOTRIN) 800 MG tablet, Take 800 mg by mouth every 6 (six) hours as needed., Disp: , Rfl:  .  ketoconazole (NIZORAL) 2 % cream, APPLY TOPICALLY TO FACE SPARINGLY TWICE DAILY, Disp: 60 g, Rfl: 11 .  ketoconazole (NIZORAL) 2 % shampoo, SHAMPOO HAIR 3 TIMES WEEKLY FOR FUNGAL INFECTION, Disp: 120 mL, Rfl: 5 .  LORazepam (ATIVAN) 0.5 MG tablet, Take 1 mg by mouth. 1 tablet PO for agitation greater than 10 mint, may repeat in 30 mins., Disp: , Rfl:  .  mometasone (ELOCON) 0.1 % lotion, Apply topically daily., Disp: , Rfl:  .  Nutritional Supplements (PROMOD) LIQD, MIX 1 OZ (30 ML) WITH 1 OZ WATER, DRINK TWICE DAILY FOR  PROTEIN SUPPLEMENT, Disp: 1892 mL, Rfl: 12 .  orphenadrine (NORFLEX) 100 MG tablet, Take 1 tablet (100 mg total) by mouth 2 (two) times daily., Disp: 60 tablet, Rfl: 0 .  QUEtiapine (SEROQUEL) 200 MG tablet, Take 200 mg by mouth daily. 4 pm (Dr. Ave Filter, Psychiatry), Disp: , Rfl:  .  REFRESH 1 % ophthalmic solution, INSTILL 1 DROP INTO EACH EYE 3 TIMES A DAY, Disp: 15 mL, Rfl: 12 .  tamsulosin (FLOMAX) 0.4 MG CAPS capsule,  TAKE 1 CAPSULE BY MOUTH DAILY, Disp: 30 capsule, Rfl: 5 .  traZODone (DESYREL) 50 MG tablet, Take 1 tablet (50 mg total) by mouth at bedtime., Disp: 30 tablet, Rfl: 2  No Known Allergies   ROS  Ten systems reviewed and is negative except as mentioned in HPI - not able to give history, obtained by caregivers, behavior seems to be good, eating well, and recent fall   Objective  Vitals:   03/18/18 0957  BP: 120/80  Pulse: 88  Resp: 16  SpO2: 97%  Weight: 119 lb 9.6 oz (54.3 kg)  Height:  (1.6 m)    Body mass index is 21.19 kg/m.  Physical Exam  Constitutional: Patient appears well-developed and frail looking, with some temporal waisting No distress.  HEENT: head atraumatic, normocephalic, pupils equal and reactive to light, ears normal neck supple, throat within normal limits Cardiovascular: Normal rate, regular rhythm and normal heart sounds.  No murmur heard. No BLE edema. Pulmonary/Chest: Effort normal and breath sounds normal. No respiratory distress. Abdominal: Soft.  There is no tenderness. Psychiatric: Patient has a normal mood and affect. behavior is normal. Judgment and thought content normal. Skin: abrasion right side of forehead, no oozing   PHQ2/9: Depression screen [facility] 2/9 02/25/2017 02/22/2016 06/24/2015  Decreased Interest 0 0 0  Down, Depressed, Hopeless 0 0 0  PHQ - 2 Score 0 0 0   Not able to screen, does not speak but seems happy today   Fall Risk: Fall Risk  03/18/2018 03/18/2018 11/18/2017 08/14/2017 07/26/2017  Falls in the past  year? Yes No No Yes Yes  Number falls in past yr: 2 or more - - 2 or more 2 or more  Injury with Fall? Yes - - No No  Risk Factor Category  High Fall Risk - - - -  Risk for fall due to : History of fall(s);Impaired balance/gait;Impaired mobility Impaired balance/gait - Impaired balance/gait;Impaired mobility;Medication side effect History of fall(s);Impaired balance/gait  Follow up Falls prevention discussed - - Education provided -     Functional Status Survey: Is the patient deaf or have difficulty hearing?: Yes Does the patient have difficulty seeing, even when wearing glasses/contacts?: No Does the patient have difficulty concentrating, remembering, or making decisions?: Yes Does the patient have difficulty walking or climbing stairs?: Yes Does the patient have difficulty dressing or bathing?: Yes Does the patient have difficulty doing errands alone such as visiting a doctor's office or shopping?: Yes   Assessment & Plan  1. Chronic bilateral low back pain without sciatica  Stable, not complaining of pain today   2. Chronic pain syndrome  Doing well   3. History of recent fall  He fell asleep watching TV while sitting on his walker  and fell forward and hit his forehead on right walker handle and scrapped forehead  4. Abrasion of forehead, initial encounter  Tdap is up to date  5. Gait instability  Uses walker  6. Gait disturbance  stable  7. Pure hypercholesterolemia  History of   8. Mild protein-calorie malnutrition (HCC)  - CBC with Differential/Platelet - COMPLETE METABOLIC PANEL WITH GFR - Lipid panel  9. Wears hearing aid   10. Vitamin D deficiency  - VITAMIN D 25 Hydroxy (Vit-D Deficiency, Fractures)  11. Chronic constipation   12. Anemia of chronic disease  Recheck labs   14. OSA (obstructive sleep apnea)  Needs to contact Dr. Margaretmary Eddy office

## 2018-03-18 NOTE — Patient Instructions (Signed)
Contact Dr. Cristopher Peru ( neurologist) - he has OSA and needs to go on CPAP 11 cm H2O

## 2018-03-21 DIAGNOSIS — H524 Presbyopia: Secondary | ICD-10-CM | POA: Diagnosis not present

## 2018-03-21 DIAGNOSIS — H04121 Dry eye syndrome of right lacrimal gland: Secondary | ICD-10-CM | POA: Diagnosis not present

## 2018-03-21 DIAGNOSIS — H52223 Regular astigmatism, bilateral: Secondary | ICD-10-CM | POA: Diagnosis not present

## 2018-03-21 DIAGNOSIS — H2513 Age-related nuclear cataract, bilateral: Secondary | ICD-10-CM | POA: Diagnosis not present

## 2018-03-21 DIAGNOSIS — H5203 Hypermetropia, bilateral: Secondary | ICD-10-CM | POA: Diagnosis not present

## 2018-03-21 DIAGNOSIS — H04122 Dry eye syndrome of left lacrimal gland: Secondary | ICD-10-CM | POA: Diagnosis not present

## 2018-03-27 ENCOUNTER — Ambulatory Visit: Payer: Medicare Other | Admitting: Family Medicine

## 2018-03-27 LAB — CBC WITH DIFFERENTIAL/PLATELET
Basophils Absolute: 77 cells/uL (ref 0–200)
Basophils Relative: 1 %
Eosinophils Absolute: 616 cells/uL — ABNORMAL HIGH (ref 15–500)
Eosinophils Relative: 8 %
HCT: 36.2 % — ABNORMAL LOW (ref 38.5–50.0)
Hemoglobin: 12.2 g/dL — ABNORMAL LOW (ref 13.2–17.1)
Lymphs Abs: 2002 cells/uL (ref 850–3900)
MCH: 30.3 pg (ref 27.0–33.0)
MCHC: 33.7 g/dL (ref 32.0–36.0)
MCV: 89.8 fL (ref 80.0–100.0)
MPV: 9.1 fL (ref 7.5–12.5)
Monocytes Relative: 10.6 %
Neutro Abs: 4189 cells/uL (ref 1500–7800)
Neutrophils Relative %: 54.4 %
Platelets: 370 10*3/uL (ref 140–400)
RBC: 4.03 10*6/uL — ABNORMAL LOW (ref 4.20–5.80)
RDW: 11.9 % (ref 11.0–15.0)
Total Lymphocyte: 26 %
WBC mixed population: 816 {cells}/uL (ref 200–950)
WBC: 7.7 10*3/uL (ref 3.8–10.8)

## 2018-03-27 LAB — COMPLETE METABOLIC PANEL WITH GFR
AG Ratio: 1.7 (calc) (ref 1.0–2.5)
ALT: 10 U/L (ref 9–46)
AST: 16 U/L (ref 10–35)
Albumin: 4 g/dL (ref 3.6–5.1)
Alkaline phosphatase (APISO): 95 U/L (ref 40–115)
BUN: 20 mg/dL (ref 7–25)
CO2: 32 mmol/L (ref 20–32)
Calcium: 9.2 mg/dL (ref 8.6–10.3)
Chloride: 97 mmol/L — ABNORMAL LOW (ref 98–110)
Creat: 0.81 mg/dL (ref 0.70–1.18)
GFR, Est African American: 104 mL/min/{1.73_m2} (ref >=60)
GFR, Est Non African American: 90 mL/min/{1.73_m2} (ref >=60)
Globulin: 2.4 g/dL (ref 1.9–3.7)
Glucose, Bld: 92 mg/dL (ref 65–139)
Potassium: 4.3 mmol/L (ref 3.5–5.3)
Sodium: 136 mmol/L (ref 135–146)
Total Bilirubin: 0.4 mg/dL (ref 0.2–1.2)
Total Protein: 6.4 g/dL (ref 6.1–8.1)

## 2018-03-27 LAB — LIPID PANEL
Cholesterol: 184 mg/dL (ref <200)
HDL: 67 mg/dL (ref >40)
LDL Cholesterol (Calc): 96 mg/dL (calc)
Non-HDL Cholesterol (Calc): 117 mg/dL (ref <130)
Total CHOL/HDL Ratio: 2.7 (calc) (ref <5.0)
Triglycerides: 116 mg/dL (ref <150)

## 2018-03-27 LAB — VITAMIN D 25 HYDROXY (VIT D DEFICIENCY, FRACTURES): Vit D, 25-Hydroxy: 50 ng/mL (ref 30–100)

## 2018-04-03 ENCOUNTER — Ambulatory Visit (INDEPENDENT_AMBULATORY_CARE_PROVIDER_SITE_OTHER): Payer: Medicare Other

## 2018-04-03 VITALS — BP 108/60 | HR 68 | Temp 99.8°F | Resp 12 | Ht 63.0 in | Wt 119.5 lb

## 2018-04-03 DIAGNOSIS — Z23 Encounter for immunization: Secondary | ICD-10-CM

## 2018-04-03 DIAGNOSIS — Z0000 Encounter for general adult medical examination without abnormal findings: Secondary | ICD-10-CM

## 2018-04-03 NOTE — Patient Instructions (Signed)
[name] , Thank you for taking time to come for your Medicare Wellness Visit. I appreciate your ongoing commitment to your health goals. Please review the following plan we discussed and let me know if I can assist you in the future.   Screening recommendations/referrals: Colorectal Screening: Completed 08/17/13. Repeat every 10 years Lung Cancer Screening: Does not qualify for this screening Hepatitis C Screening: Completed 06/05/13  Vision and Dental Exams: Recommended annual ophthalmology exams for early detection of glaucoma and other disorders of the eye Recommended annual dental exams for proper oral hygiene  Vaccinations: Influenza vaccine: Completed 06/28/17 Pneumococcal vaccine: PCV13 completed 08/04/15, PPSV23 completed 03/27/10 Tdap vaccine: Completed 03/27/10 Shingles vaccine: Zostavax completed 11/19/12. Please call your insurance company to determine your out of pocket expense for the Shingrix vaccine. You may also receive this vaccine at your local pharmacy or Health Dept.  Advanced directives: Please bring a copy of your POA (Power of Attorney) and/or Living Will to your next appointment.  Conditions/risks identified: Recommend to drink at least 6-8 8oz glasses of water per day.  Next appointment: Please schedule your Annual Wellness Visit with your Nurse Health Advisor in one year.  Preventive Care 20 Years and Older, Male Preventive care refers to lifestyle choices and visits with your health care provider that can promote health and wellness. What does preventive care include?  A yearly physical exam. This is also called an annual well check.  Dental exams once or twice a year.  Routine eye exams. Ask your health care provider how often you should have your eyes checked.  Personal lifestyle choices, including:  Daily care of your teeth and gums.  Regular physical activity.  Eating a healthy diet.  Avoiding tobacco and drug use.  Limiting alcohol  use.  Practicing safe sex.  Taking low doses of aspirin every day.  Taking vitamin and mineral supplements as recommended by your health care provider. What happens during an annual well check? The services and screenings done by your health care provider during your annual well check will depend on your age, overall health, lifestyle risk factors, and family history of disease. Counseling  Your health care provider may ask you questions about your:  Alcohol use.  Tobacco use.  Drug use.  Emotional well-being.  Home and relationship well-being.  Sexual activity.  Eating habits.  History of falls.  Memory and ability to understand (cognition).  Work and work Astronomer. Screening  You may have the following tests or measurements:  Height, weight, and BMI.  Blood pressure.  Lipid and cholesterol levels. These may be checked every 5 years, or more frequently if you are over 19 years old.  Skin check.  Lung cancer screening. You may have this screening every year starting at age 37 if you have a 30-pack-year history of smoking and currently smoke or have quit within the past 15 years.  Fecal occult blood test (FOBT) of the stool. You may have this test every year starting at age 37.  Flexible sigmoidoscopy or colonoscopy. You may have a sigmoidoscopy every 5 years or a colonoscopy every 10 years starting at age 86.  Prostate cancer screening. Recommendations will vary depending on your family history and other risks.  Hepatitis C blood test.  Hepatitis B blood test.  Sexually transmitted disease (STD) testing.  Diabetes screening. This is done by checking your blood sugar (glucose) after you have not eaten for a while (fasting). You may have this done every 1-3 years.  Abdominal aortic  aneurysm (AAA) screening. You may need this if you are a current or former smoker.  Osteoporosis. You may be screened starting at age 52 if you are at high risk. Talk with your  health care provider about your test results, treatment options, and if necessary, the need for more tests. Vaccines  Your health care provider may recommend certain vaccines, such as:  Influenza vaccine. This is recommended every year.  Tetanus, diphtheria, and acellular pertussis (Tdap, Td) vaccine. You may need a Td booster every 10 years.  Zoster vaccine. You may need this after age 44.  Pneumococcal 13-valent conjugate (PCV13) vaccine. One dose is recommended after age 50.  Pneumococcal polysaccharide (PPSV23) vaccine. One dose is recommended after age 45. Talk to your health care provider about which screenings and vaccines you need and how often you need them. This information is not intended to replace advice given to you by your health care provider. Make sure you discuss any questions you have with your health care provider. Document Released: 11/18/2015 Document Revised: 07/11/2016 Document Reviewed: 08/23/2015 Elsevier Interactive Patient Education  2017 [employer].  Fall Prevention in the Home Falls can cause injuries. They can happen to people of all ages. There are many things you can do to make your home safe and to help prevent falls. What can I do on the outside of my home?  Regularly fix the edges of walkways and driveways and fix any cracks.  Remove anything that might make you trip as you walk through a door, such as a raised step or threshold.  Trim any bushes or trees on the path to your home.  Use bright outdoor lighting.  Clear any walking paths of anything that might make someone trip, such as rocks or tools.  Regularly check to see if handrails are loose or broken. Make sure that both sides of any steps have handrails.  Any raised decks and porches should have guardrails on the edges.  Have any leaves, snow, or ice cleared regularly.  Use sand or salt on walking paths during winter.  Clean up any spills in your garage right away. This includes oil  or grease spills. What can I do in the bathroom?  Use night lights.  Install grab bars by the toilet and in the tub and shower. Do not use towel bars as grab bars.  Use non-skid mats or decals in the tub or shower.  If you need to sit down in the shower, use a plastic, non-slip stool.  Keep the floor dry. Clean up any water that spills on the floor as soon as it happens.  Remove soap buildup in the tub or shower regularly.  Attach bath mats securely with double-sided non-slip rug tape.  Do not have throw rugs and other things on the floor that can make you trip. What can I do in the bedroom?  Use night lights.  Make sure that you have a light by your bed that is easy to reach.  Do not use any sheets or blankets that are too big for your bed. They should not hang down onto the floor.  Have a firm chair that has side arms. You can use this for support while you get dressed.  Do not have throw rugs and other things on the floor that can make you trip. What can I do in the kitchen?  Clean up any spills right away.  Avoid walking on wet floors.  Keep items that you use a lot  in easy-to-reach places.  If you need to reach something above you, use a strong step stool that has a grab bar.  Keep electrical cords out of the way.  Do not use floor polish or wax that makes floors slippery. If you must use wax, use non-skid floor wax.  Do not have throw rugs and other things on the floor that can make you trip. What can I do with my stairs?  Do not leave any items on the stairs.  Make sure that there are handrails on both sides of the stairs and use them. Fix handrails that are broken or loose. Make sure that handrails are as long as the stairways.  Check any carpeting to make sure that it is firmly attached to the stairs. Fix any carpet that is loose or worn.  Avoid having throw rugs at the top or bottom of the stairs. If you do have throw rugs, attach them to the floor with  carpet tape.  Make sure that you have a light switch at the top of the stairs and the bottom of the stairs. If you do not have them, ask someone to add them for you. What else can I do to help prevent falls?  Wear shoes that:  Do not have high heels.  Have rubber bottoms.  Are comfortable and fit you well.  Are closed at the toe. Do not wear sandals.  If you use a stepladder:  Make sure that it is fully opened. Do not climb a closed stepladder.  Make sure that both sides of the stepladder are locked into place.  Ask someone to hold it for you, if possible.  Clearly mark and make sure that you can see:  Any grab bars or handrails.  First and last steps.  Where the edge of each step is.  Use tools that help you move around (mobility aids) if they are needed. These include:  Canes.  Walkers.  Scooters.  Crutches.  Turn on the lights when you go into a dark area. Replace any light bulbs as soon as they burn out.  Set up your furniture so you have a clear path. Avoid moving your furniture around.  If any of your floors are uneven, fix them.  If there are any pets around you, be aware of where they are.  Review your medicines with your doctor. Some medicines can make you feel dizzy. This can increase your chance of falling. Ask your doctor what other things that you can do to help prevent falls. This information is not intended to replace advice given to you by your health care provider. Make sure you discuss any questions you have with your health care provider. Document Released: 08/18/2009 Document Revised: 03/29/2016 Document Reviewed: 11/26/2014 Elsevier Interactive Patient Education  2017 [employer].

## 2018-04-03 NOTE — Progress Notes (Addendum)
Subjective:   Aaron Foster is a 72 y.o. male who presents for Medicare Annual/Subsequent preventive examination.  Review of Systems:  N/A Cardiac Risk Factors include: advanced age (>60men, >67 women);dyslipidemia;male gender     Objective:    Vitals: Resp 12   Ht  (1.6 m)   Wt 119 lb 8 oz (54.2 kg)   BMI 21.17 kg/m   Body mass index is 21.17 kg/m.  Advanced Directives 07/26/2017 05/10/2017 02/25/2017 10/26/2016 09/07/2016 02/22/2016 08/24/2015  Does Patient Have a Medical Advance Directive? No No No Yes Yes Yes No  Type of Advance Directive - - - Production designer, theatre/television/film of [employer] Power of [place];Living will -  Does patient want to make changes to medical advance directive? - - - - No - Patient declined No - Patient declined -  Copy of Healthcare Power of Attorney in Chart? - - - No - copy requested No - copy requested No - copy requested -  Would patient like information on creating a medical advance directive? - - - - - - -    Tobacco Social History   Tobacco Use  Smoking Status Former Smoker  Smokeless Tobacco Never Used  Tobacco Comment   smoking cessation materials not required     Counseling given: No Comment: smoking cessation materials not required  Clinical Intake:  Pre-visit preparation completed: Yes  Pain : No/denies pain   BMI - recorded: 20.32 Nutritional Status: BMI of 19-24  Normal Nutritional Risks: None Diabetes: No  How often do you need to have someone help you when you read instructions, pamphlets, or other written materials from your doctor or pharmacy?: 1 - Never  Interpreter Needed?: No  Information entered by :: AEversole, LPN  Hospitalizations/ED visits and surgeries occurring within the previous 12 months:  Within the previous 12 months, pt has not underwent any surgical procedures, has not been hospitalized for any conditions and has not been treated by an emergency room clinician.  Past  Medical History:  Diagnosis Date  . Anemia   . Bilateral renal cysts   . BPH (benign prostatic hyperplasia)   . Cannot hear   . Chronic back pain   . Constipation   . Esophageal reflux   . Hemorrhoid   . HLD (hyperlipidemia)   . Insomnia   . Nephrolithiasis   . Osteoporosis   . Phimosis   . Seborrheic dermatitis    Past Surgical History:  Procedure Laterality Date  . arm fracture    . circumscision     Family History  Problem Relation Age of Onset  . Kidney disease Neg Hx    Social History   Socioeconomic History  . Marital status: Single    Spouse name: Not on file  . Number of children: Not on file  . Years of education: Not on file  . Highest education level: Not on file  Occupational History  . Occupation: Retired  Engineer, production  . Financial resource strain: Not hard at all  . Food insecurity:    Worry: Never true    Inability: Never true  . Transportation needs:    Medical: No    Non-medical: No  Tobacco Use  . Smoking status: Former Games developer  . Smokeless tobacco: Never Used  . Tobacco comment: smoking cessation materials not required  Substance and Sexual Activity  . Alcohol use: No    Alcohol/week: 0.0 oz  . Drug use: No  . Sexual activity: Not Currently  Lifestyle  . Physical activity:    Days per week: Not on file    Minutes per session: Not on file  . Stress: Not on file  Relationships  . Social connections:    Talks on phone: Patient refused    Gets together: Patient refused    Attends religious service: Patient refused    Active member of club or organization: Patient refused    Attends meetings of clubs or organizations: Patient refused    Relationship status: Patient refused  Other Topics Concern  . Not on file  Social History Narrative  . Not on file    Outpatient Encounter Medications as of 04/03/2018  Medication Sig  . acetaminophen (MAPAP) 500 MG tablet TAKE 2 TABLETS BY MOUTH 3 TIMES DAILY FOR PAIN  . AMITIZA 24 MCG capsule  TAKE 1 CAPSULE BY MOUTH 2 TIMES DAILY WITH A MEAL.  . Calcium Carbonate-Vitamin D3 600-400 MG-UNIT TABS Take by mouth.  . Cholecalciferol 1000 units tablet Take 1 tablet (1,000 Units total) by mouth daily.  . DULoxetine (CYMBALTA) 30 MG capsule Take 90 mg by mouth daily.  Marland Kitchen esomeprazole (NEXIUM) 40 MG capsule TAKE 1 CAPSULE BY MOUTH AT LEAST 30 MINUTES BEFORE FOOD, ONCE DAILY FOR REFLUX.  Marland Kitchen gabapentin (NEURONTIN) 100 MG capsule Take 200 mg by mouth 3 (three) times daily.  . GNP ASPIRIN LOW DOSE 81 MG EC tablet TAKE 1 TABLET BY MOUTH ONCE DAILY FOR CIRCULATION  . ibuprofen (ADVIL,MOTRIN) 800 MG tablet Take 800 mg by mouth every 6 (six) hours as needed.  Marland Kitchen ketoconazole (NIZORAL) 2 % cream APPLY TOPICALLY TO FACE SPARINGLY TWICE DAILY  . ketoconazole (NIZORAL) 2 % shampoo SHAMPOO HAIR 3 TIMES WEEKLY FOR FUNGAL INFECTION  . LORazepam (ATIVAN) 0.5 MG tablet Take 1 mg by mouth. 1 tablet PO for agitation greater than 10 mint, may repeat in 30 mins.  . mometasone (ELOCON) 0.1 % lotion Apply topically daily.  . Nutritional Supplements (PROMOD) LIQD MIX 1 OZ (30 ML) WITH 1 OZ WATER, DRINK TWICE DAILY FOR PROTEIN SUPPLEMENT  . orphenadrine (NORFLEX) 100 MG tablet Take 1 tablet (100 mg total) by mouth 2 (two) times daily.  . QUEtiapine (SEROQUEL) 200 MG tablet Take 200 mg by mouth daily. 4 pm (Dr. Ave Filter, Psychiatry)  . REFRESH 1 % ophthalmic solution INSTILL 1 DROP INTO EACH EYE 3 TIMES A DAY  . tamsulosin (FLOMAX) 0.4 MG CAPS capsule TAKE 1 CAPSULE BY MOUTH DAILY  . traZODone (DESYREL) 50 MG tablet Take 1 tablet (50 mg total) by mouth at bedtime.   No facility-administered encounter medications on file as of 04/03/2018.     Activities of Daily Living In your present state of health, do you have any difficulty performing the following activities: 04/03/2018 03/18/2018  Hearing? (No Data) Y  Comment hearing aids -  Vision? (No Data) N  Comment wears eyeglasses -  Difficulty concentrating or making  decisions? - Y  Walking or climbing stairs? - Y  Dressing or bathing? - Y  Doing errands, shopping? - Y  Preparing Food and eating ? (No Data) -  Comment dentures -  Some recent data might be hidden    Patient Care Team: Alba Cory, MD as PCP - General (Family Medicine) Bud Face, MD as Referring Physician (Otolaryngology) Antonietta Breach, MD as Referring Physician (Psychiatry) Domingo Madeira, OD (Optometry) Midge Minium, MD as Consulting Physician (Gastroenterology) Myra Rude, MD as Consulting Physician (Orthopedic Surgery) Ulanda Edison, MD as Consulting Physician (Urology)  Assessment:   This is a routine wellness examination for Aaron Foster.  Exercise Activities and Dietary recommendations    Goals    . DIET - INCREASE WATER INTAKE     Recommend to drink at least 6-8 8oz glasses of water per day.       Fall Risk Fall Risk  04/03/2018 03/18/2018 03/18/2018 11/18/2017 08/14/2017  Falls in the past year? - Yes No No Yes  Number falls in past yr: - 2 or more - - 2 or more  Injury with Fall? - Yes - - No  Risk Factor Category  - High Fall Risk - - -  Risk for fall due to : Impaired vision History of fall(s);Impaired balance/gait;Impaired mobility Impaired balance/gait - Impaired balance/gait;Impaired mobility;Medication side effect  Risk for fall due to: Comment wears eyeglasses - - - -  Follow up - Falls prevention discussed - - Education provided   FALL RISK PREVENTION PERTAINING TO HOME: Is your home free of loose throw rugs in walkways, pet beds, electrical cords, etc? Yes Is there adequate lighting in your home to reduce risk of falls?  Yes Are there stairs in or around your home WITH handrails? Yes  ASSISTIVE DEVICES UTILIZED TO PREVENT FALLS: Use of a cane, walker or w/c? Yes, rolling walker Grab bars in the bathroom? Yes  Shower chair or a place to sit while bathing? Yes An elevated toilet seat or a handicapped toilet? Yes  Timed Get Up and  Go Performed: Yes. Pt ambulated 10 feet within 22 sec. Gait slow, steady and without the use of an assistive device. No intervention required at this time. Fall risk prevention has been discussed.  Community Resource Referral:  [employer] Referral not required at this time.   Depression Screen PHQ 2/9 Scores 03/18/2018 03/18/2018 05/10/2017 02/25/2017  PHQ - 2 Score - - - 0  Exception Documentation Medical reason Other- indicate reason in comment box Medical reason -  Not completed - not able to speak - -    Cognitive Function: Unable to complete        Immunization History  Administered Date(s) Administered  . Influenza, High Dose Seasonal PF 09/07/2016, 06/28/2017  . Influenza,inj,Quad PF,6+ Mos 06/24/2015  . Influenza-Unspecified 10/18/2014  . Pneumococcal Conjugate-13 08/04/2015  . Pneumococcal Polysaccharide-23 03/27/2010  . Tdap 03/27/2010  . Zoster 11/19/2012    Qualifies for Shingles Vaccine? Yes. Zostavax completed 11/19/12. Due for Shingrix. Education has been provided regarding the importance of this vaccine. Pt has been advised to call his insurance company to determine his out of pocket expense. Advised he may also receive this vaccine at his local pharmacy or Health Dept. Verbalized acceptance and understanding.  Hep B administered today at the request of Dr. Carlynn Purl. Pt lives in group home and does not appear to have had a previous Hep B immunization administered. Staff member is aware of financial responsibilities for administering vaccine today. Verbal consent obtained.  Screening Tests Health Maintenance  Topic Date Due  . INFLUENZA VACCINE  06/05/2018  . TETANUS/TDAP  03/27/2020  . COLONOSCOPY  08/18/2023  . Hepatitis C Screening  Completed  . PNA vac Low Risk Adult  Completed   Cancer Screenings: Lung: Low Dose CT Chest recommended if Age 65-80 years, 30 pack-year currently smoking OR have quit w/in 15years. Patient does not qualify. Colorectal:  Completed 08/17/13. Repeat every 10 years  Additional Screenings: Hepatitis C Screening: Completed 06/05/13     Plan:  I have personally reviewed and addressed the Medicare  Annual Wellness questionnaire and have noted the following in the patient's chart:  A. Medical and social history B. Use of alcohol, tobacco or illicit drugs  C. Current medications and supplements D. Functional ability and status E.  Nutritional status F.  Physical activity G. Advance directives H. List of other physicians I.  Hospitalizations, surgeries, and ER visits in previous 12 months J.  Vitals K. Screenings such as hearing and vision if needed, cognitive and depression L. Referrals and appointments  In addition, I have reviewed and discussed with patient certain preventive protocols, quality metrics, and best practice recommendations. A written personalized care plan for preventive services as well as general preventive health recommendations were provided to patient.  See attached scanned questionnaire for additional information.   Signed,  Deon Pilling, LPN Nurse Health Advisor  I have reviewed this encounter including the documentation in this note and/or discussed this patient with the provider, Deon Pilling, LPN. I am certifying that I agree with the content of this note as supervising physician.  Alba Cory, MD [facility] 04/04/2018, 7:45 AM

## 2018-06-10 DIAGNOSIS — F419 Anxiety disorder, unspecified: Secondary | ICD-10-CM | POA: Diagnosis not present

## 2018-06-23 DIAGNOSIS — G4733 Obstructive sleep apnea (adult) (pediatric): Secondary | ICD-10-CM | POA: Diagnosis not present

## 2018-06-23 DIAGNOSIS — R4689 Other symptoms and signs involving appearance and behavior: Secondary | ICD-10-CM | POA: Diagnosis not present

## 2018-06-23 DIAGNOSIS — Z9989 Dependence on other enabling machines and devices: Secondary | ICD-10-CM

## 2018-06-23 DIAGNOSIS — R269 Unspecified abnormalities of gait and mobility: Secondary | ICD-10-CM | POA: Diagnosis not present

## 2018-06-23 DIAGNOSIS — R4189 Other symptoms and signs involving cognitive functions and awareness: Secondary | ICD-10-CM | POA: Diagnosis not present

## 2018-06-23 DIAGNOSIS — H9193 Unspecified hearing loss, bilateral: Secondary | ICD-10-CM | POA: Diagnosis not present

## 2018-07-08 ENCOUNTER — Ambulatory Visit: Payer: Medicare Other

## 2018-07-17 DIAGNOSIS — H6062 Unspecified chronic otitis externa, left ear: Secondary | ICD-10-CM | POA: Diagnosis not present

## 2018-07-17 DIAGNOSIS — H6123 Impacted cerumen, bilateral: Secondary | ICD-10-CM | POA: Diagnosis not present

## 2018-07-21 ENCOUNTER — Ambulatory Visit (INDEPENDENT_AMBULATORY_CARE_PROVIDER_SITE_OTHER): Payer: Medicare Other | Admitting: Family Medicine

## 2018-07-21 ENCOUNTER — Encounter: Payer: Self-pay | Admitting: Family Medicine

## 2018-07-21 VITALS — BP 118/64 | HR 88 | Temp 97.8°F | Resp 18 | Ht 63.0 in | Wt 122.2 lb

## 2018-07-21 DIAGNOSIS — K5909 Other constipation: Secondary | ICD-10-CM

## 2018-07-21 DIAGNOSIS — E7800 Pure hypercholesterolemia, unspecified: Secondary | ICD-10-CM | POA: Diagnosis not present

## 2018-07-21 DIAGNOSIS — G4733 Obstructive sleep apnea (adult) (pediatric): Secondary | ICD-10-CM | POA: Diagnosis not present

## 2018-07-21 DIAGNOSIS — R269 Unspecified abnormalities of gait and mobility: Secondary | ICD-10-CM

## 2018-07-21 DIAGNOSIS — Z23 Encounter for immunization: Secondary | ICD-10-CM | POA: Diagnosis not present

## 2018-07-21 DIAGNOSIS — E441 Mild protein-calorie malnutrition: Secondary | ICD-10-CM

## 2018-07-21 DIAGNOSIS — G8929 Other chronic pain: Secondary | ICD-10-CM | POA: Diagnosis not present

## 2018-07-21 DIAGNOSIS — M545 Low back pain, unspecified: Secondary | ICD-10-CM

## 2018-07-21 DIAGNOSIS — G894 Chronic pain syndrome: Secondary | ICD-10-CM

## 2018-07-21 NOTE — Patient Instructions (Addendum)
Check with insurance if he can get shingrix in our office Please also give us the name of the person who has his power of attorney of health care and contact information  [name] still very thin and needs to continue taking protein shakes and eat snacks multiple times a day when possible

## 2018-07-21 NOTE — Progress Notes (Signed)
Name: Aaron Foster   MRN: [id number]    DOB: 12-31-1945   Date:07/21/2018       Progress Note  Subjective  Chief Complaint  Chief Complaint  Patient presents with  . Medication Refill    Has new hearing aids but refuses to wear them  . Back Pain  . Depression  . Anemia  . Constipation  . Sleep Apnea  . Gait Problem    HPI  Hearing loss: sees Dr. Andee Poles, he does not like wearing it.   Protein Calorie malnutrition: he is pick eaterhe is taking protein supplement three times a day, he gained  2  lbs since last visit, he has been eating dessert and taking Ensure three times daily and is doing well, good appetite,  Reviewed last labs and is doing well   Chronic pain: has a brace on left wrist andusually on hisright knee( but not wearing it today), had injections on right knee int he past He has chronic back pain and istaking Norflex, unfortunately insurance is not covering lidoderm patches, but pain seems to be controlled.  Depression: his behavior is better at group home and seems happy today, not complaining today, seeing Dr. Ave Filter, enjoys singing and dancing at the day program. His mood has improved, not having anger outburst as frequently lately   Gait instability: using a walker, he is having PT again.   Anemia:of chronic disease, last labs stable, no SOB. Reviewed labs   Chronic constipation: taking medication and has regular bowel movements, every other day, no straining or pain.Taking prune juice.  Dermatitis seborrheic:using medication, stable.   OSA: found results on Dr. Margaretmary Eddy visit, he is on CPAP every night now and Britta Mccreedy states he is compliant except that sometimes when he is mouth breathing the machine does not pick it up. She thinks his behavior has improved with CPAP  Patient Active Problem List   Diagnosis Date Noted  . OSA on CPAP 06/23/2018  . OSA (obstructive sleep apnea) 03/18/2018  . Vitamin D deficiency 04/08/2017  . Scoliosis  02/22/2016  . Recurrent falls 08/04/2015  . Dermatitis seborrheica 06/24/2015  . Chronic constipation 06/24/2015  . Lives in group home 06/24/2015  . Protein calorie malnutrition (HCC) 06/24/2015  . Hearing loss of both ears 06/24/2015  . Wears hearing aid 06/24/2015  . History of iron deficiency anemia 06/24/2015  . Osteoporosis 06/24/2015  . Adult behavior problem 06/24/2015  . Hiatal hernia 06/24/2015  . Hemorrhoid 06/24/2015  . Gait instability 06/24/2015  . DDD (degenerative disc disease), lumbar 06/24/2015  . Nephrolithiasis 06/24/2015  . Hyperlipidemia 06/24/2015  . Compression fracture of L1 lumbar vertebra (HCC) 06/24/2015  . Family history of upper GI bleeding   . BPH with obstruction/lower urinary tract symptoms 06/21/2015  . Chronic pain 06/19/2015  . Acid reflux 06/19/2015  . Intellectual disability 06/19/2015    Past Surgical History:  Procedure Laterality Date  . arm fracture    . circumscision      Family History  Problem Relation Age of Onset  . Kidney disease Neg Hx     Social History   Socioeconomic History  . Marital status: Single    Spouse name: Not on file  . Number of children: 0  . Years of education: unknown  . Highest education level: Not on file  Occupational History  . Occupation: Retired  Engineer, production  . Financial resource strain: Not hard at all  . Food insecurity:    Worry: Never true  Inability: Never true  . Transportation needs:    Medical: No    Non-medical: No  Tobacco Use  . Smoking status: Former Smoker    Types: Pipe  . Smokeless tobacco: Never Used  . Tobacco comment: unknown smoking history; smoking cessation materials not required  Substance and Sexual Activity  . Alcohol use: No    Alcohol/week: 0.0 standard drinks  . Drug use: No  . Sexual activity: Not Currently  Lifestyle  . Physical activity:    Days per week: 0 days    Minutes per session: 0 min  . Stress: Not at all  Relationships  . Social  connections:    Talks on phone: Patient refused    Gets together: Patient refused    Attends religious service: Patient refused    Active member of club or organization: Patient refused    Attends meetings of clubs or organizations: Patient refused    Relationship status: Patient refused  . Intimate partner violence:    Fear of current or ex partner: No    Emotionally abused: No    Physically abused: No    Forced sexual activity: No  Other Topics Concern  . Not on file  Social History Narrative  . Not on file     Current Outpatient Medications:  .  acetaminophen (MAPAP) 500 MG tablet, TAKE 2 TABLETS BY MOUTH 3 TIMES DAILY FOR PAIN, Disp: 180 tablet, Rfl: 1 .  AMITIZA 24 MCG capsule, TAKE 1 CAPSULE BY MOUTH 2 TIMES DAILY WITH A MEAL. (Patient taking differently: TAKE 1 CAPSULE BY MOUTH 2 TIMES DAILY), Disp: 60 capsule, Rfl: 5 .  Calcium Carbonate-Vitamin D3 600-400 MG-UNIT TABS, Take by mouth., Disp: , Rfl:  .  Cholecalciferol 1000 units tablet, Take 1 tablet (1,000 Units total) by mouth daily., Disp: 90 tablet, Rfl: 2 .  DULoxetine (CYMBALTA) 30 MG capsule, Take 90 mg by mouth daily., Disp: , Rfl:  .  esomeprazole (NEXIUM) 40 MG capsule, TAKE 1 CAPSULE BY MOUTH AT LEAST 30 MINUTES BEFORE FOOD, ONCE DAILY FOR REFLUX., Disp: 30 capsule, Rfl: 5 .  gabapentin (NEURONTIN) 100 MG capsule, Take 200 mg by mouth 3 (three) times daily., Disp: , Rfl:  .  GNP ASPIRIN LOW DOSE 81 MG EC tablet, TAKE 1 TABLET BY MOUTH ONCE DAILY FOR CIRCULATION, Disp: 30 tablet, Rfl: 5 .  ibuprofen (ADVIL,MOTRIN) 800 MG tablet, Take 800 mg by mouth every 6 (six) hours as needed., Disp: , Rfl:  .  ketoconazole (NIZORAL) 2 % cream, APPLY TOPICALLY TO FACE SPARINGLY TWICE DAILY, Disp: 60 g, Rfl: 11 .  ketoconazole (NIZORAL) 2 % shampoo, SHAMPOO HAIR 3 TIMES WEEKLY FOR FUNGAL INFECTION, Disp: 120 mL, Rfl: 5 .  LORazepam (ATIVAN) 0.5 MG tablet, Take 1 mg by mouth. 1 tablet PO for agitation greater than 10 mint, may repeat  in 30 mins., Disp: , Rfl:  .  mometasone (ELOCON) 0.1 % lotion, Apply topically daily., Disp: , Rfl:  .  Nutritional Supplements (PROMOD) LIQD, MIX 1 OZ (30 ML) WITH 1 OZ WATER, DRINK TWICE DAILY FOR PROTEIN SUPPLEMENT, Disp: 1892 mL, Rfl: 12 .  orphenadrine (NORFLEX) 100 MG tablet, Take 1 tablet (100 mg total) by mouth 2 (two) times daily., Disp: 60 tablet, Rfl: 0 .  QUEtiapine (SEROQUEL) 200 MG tablet, Take 200 mg by mouth daily. 4 pm (Dr. Ave Filter, Psychiatry), Disp: , Rfl:  .  REFRESH 1 % ophthalmic solution, INSTILL 1 DROP INTO EACH EYE 3 TIMES A DAY, Disp: 15 mL,  Rfl: 12 .  tamsulosin (FLOMAX) 0.4 MG CAPS capsule, TAKE 1 CAPSULE BY MOUTH DAILY, Disp: 30 capsule, Rfl: 5 .  traZODone (DESYREL) 50 MG tablet, Take 1 tablet (50 mg total) by mouth at bedtime., Disp: 30 tablet, Rfl: 2  No Known Allergies  I personally reviewed active problem list, medication list, allergies, family history, social history with the patient/caregiver today.   ROS  Difficulty to understand, but caregiver answered for him Constitutional: Negative for fever or weight change.  Respiratory: Negative for cough and shortness of breath.   Cardiovascular: Negative for chest pain or palpitations.  Gastrointestinal: Negative for abdominal pain, no bowel changes.  Musculoskeletal: Positive for gait problem but no  joint swelling.  Skin: Negative for rash.  Neurological: Negative for dizziness or headache.  No other specific complaints in a complete review of systems (except as listed in HPI above).  Objective  Vitals:   07/21/18 1051  BP: 118/64  Pulse: 88  Resp: 18  Temp: 97.8 F (36.6 C)  TempSrc: Oral  SpO2: 99%  Weight: 122 lb 3.2 oz (55.4 kg)  Height: 5\' 3"  (1.6 m)    Body mass index is 21.65 kg/m.  Physical Exam  Constitutional: Patient appears well-developed and malnourished looking, pale and temporal waiting . No distress.  HEENT: head atraumatic, normocephalic, pupils equal and reactive to  light, neck supple, throat within normal limits Cardiovascular: Normal rate, regular rhythm and normal heart sounds.  No murmur heard. No BLE edema. Pulmonary/Chest: Effort normal and breath sounds normal. No respiratory distress. Abdominal: Soft.  There is no tenderness. Psychiatric: Patient has a normal mood and affect. behavior is normal. Judgment and thought content normal.  PHQ2/9: Depression screen [facility] 2/9 02/25/2017 02/22/2016 06/24/2015  Decreased Interest 0 0 0  Down, Depressed, Hopeless 0 0 0  PHQ - 2 Score 0 0 0     Fall Risk: Fall Risk  04/03/2018 03/18/2018 03/18/2018 11/18/2017 08/14/2017  Falls in the past year? Yes Yes No No Yes  Number falls in past yr: 2 or more 2 or more - - 2 or more  Injury with Fall? No Yes - - No  Risk Factor Category  High Fall Risk High Fall Risk - - -  Risk for fall due to : Impaired vision;History of fall(s);Impaired balance/gait;Medication side effect;Impaired mobility History of fall(s);Impaired balance/gait;Impaired mobility Impaired balance/gait - Impaired balance/gait;Impaired mobility;Medication side effect  Risk for fall due to: Comment wears eyeglasses - - - -  Follow up Falls evaluation completed;Education provided;Falls prevention discussed Falls prevention discussed - - Education provided     Functional Status Survey: Is the patient deaf or have difficulty hearing?: Yes Does the patient have difficulty seeing, even when wearing glasses/contacts?: Yes Does the patient have difficulty concentrating, remembering, or making decisions?: Yes Does the patient have difficulty walking or climbing stairs?: Yes Does the patient have difficulty dressing or bathing?: Yes Does the patient have difficulty doing errands alone such as visiting a doctor's office or shopping?: Yes    Assessment & Plan  1. Chronic bilateral low back pain without sciatica  Stable, seems comfortable today  2. Need for immunization against influenza  - Flu vaccine  HIGH DOSE PF (Fluzone High dose)  3. Need for hepatitis B vaccination  - Hepatitis B vaccine adult IM  4. Chronic pain syndrome  stable  5. Pure hypercholesterolemia   6. Gait disturbance  stable  7. Chronic constipation  Stable  8. Mild protein-calorie malnutrition (HCC)  Eating more, weight  is stable   9. OSA (obstructive sleep apnea)  [name] wearing CPAP every night

## 2018-07-31 ENCOUNTER — Ambulatory Visit (INDEPENDENT_AMBULATORY_CARE_PROVIDER_SITE_OTHER): Payer: Medicare Other | Admitting: Urology

## 2018-07-31 ENCOUNTER — Encounter: Payer: Self-pay | Admitting: Urology

## 2018-07-31 VITALS — BP 112/66 | HR 86 | Ht 63.0 in | Wt 123.0 lb

## 2018-07-31 DIAGNOSIS — N138 Other obstructive and reflux uropathy: Secondary | ICD-10-CM

## 2018-07-31 DIAGNOSIS — N401 Enlarged prostate with lower urinary tract symptoms: Secondary | ICD-10-CM | POA: Diagnosis not present

## 2018-07-31 LAB — BLADDER SCAN AMB NON-IMAGING: Scan Result: 36

## 2018-07-31 MED ORDER — TAMSULOSIN HCL 0.4 MG PO CAPS: 0.4000 mg | ORAL_CAPSULE | Freq: Every day | ORAL | 5 refills | Status: DC

## 2018-07-31 NOTE — Progress Notes (Signed)
07/31/2018 12:06 PM   Aaron Foster 08-09-1946 [id number]  Referring provider: Alba Cory, MD [address] [place], [place] [address]  Chief Complaint  Patient presents with  . Follow-up    HPI: Aaron Foster a 72 year old Caucasian male with BPH and LUTS who presents today for yearly follow up.  BPH with LUTS His IPSS score today is 0, which is no lower urinary tract symptomatology. [name] delighted with his urinary symptoms.  His previous IPSS score was 9/1.  His PVR is 36 mL.  His previous PVR was 40 mL.    His main complaint is nocturia.  He denies any dysuria, hematuria or suprapubic pain.  He currently taking tamsulosin.  He also denies any recent fevers, chills, nausea or vomiting.  His family history is unknown.   IPSS    Row Name 07/31/18 1100         International Prostate Symptom Score   How often have you had the sensation of not emptying your bladder?  Not at All     How often have you had to urinate less than every two hours?  Not at All     How often have you found you stopped and started again several times when you urinated?  Not at All     How often have you found it difficult to postpone urination?  Not at All     How often have you had a weak urinary stream?  Not at All     How often have you had to strain to start urination?  Not at All     How many times did you typically get up at night to urinate?  None     Total IPSS Score  0       Quality of Life due to urinary symptoms   If you were to spend the rest of your life with your urinary condition just the way it is now how would you feel about that?  Delighted        Score:  1-7 Mild 8-19 Moderate 20-35 Severe     PMH: Past Medical History:  Diagnosis Date  . Anemia   . Bilateral renal cysts   . BPH (benign prostatic hyperplasia)   . Cannot hear   . Chronic back pain   . Constipation   . Esophageal reflux   . Hemorrhoid   . HLD (hyperlipidemia)   . Insomnia   .  Nephrolithiasis   . Osteoporosis   . Phimosis   . Seborrheic dermatitis     Surgical History: Past Surgical History:  Procedure Laterality Date  . arm fracture    . circumscision      Home Medications:  Allergies as of 07/31/2018   No Known Allergies     Medication List        Accurate as of 07/31/18 12:06 PM. Always use your most recent med list.          acetaminophen 500 MG tablet Commonly known as:  TYLENOL TAKE 2 TABLETS BY MOUTH 3 TIMES DAILY FOR PAIN   AMITIZA 24 MCG capsule Generic drug:  lubiprostone TAKE 1 CAPSULE BY MOUTH 2 TIMES DAILY WITH A MEAL.   Calcium Carbonate-Vitamin D3 600-400 MG-UNIT Tabs Take by mouth.   Cholecalciferol 1000 units tablet Take 1 tablet (1,000 Units total) by mouth daily.   DULoxetine 30 MG capsule Commonly known as:  CYMBALTA Take 90 mg by mouth daily.   esomeprazole  40 MG capsule Commonly known as:  NEXIUM TAKE 1 CAPSULE BY MOUTH AT LEAST 30 MINUTES BEFORE FOOD, ONCE DAILY FOR REFLUX.   gabapentin 100 MG capsule Commonly known as:  NEURONTIN Take 200 mg by mouth 3 (three) times daily.   GNP ASPIRIN LOW DOSE 81 MG EC tablet Generic drug:  aspirin TAKE 1 TABLET BY MOUTH ONCE DAILY FOR CIRCULATION   ibuprofen 800 MG tablet Commonly known as:  ADVIL,MOTRIN Take 800 mg by mouth every 6 (six) hours as needed.   ketoconazole 2 % shampoo Commonly known as:  NIZORAL SHAMPOO HAIR 3 TIMES WEEKLY FOR FUNGAL INFECTION   ketoconazole 2 % cream Commonly known as:  NIZORAL APPLY TOPICALLY TO FACE SPARINGLY TWICE DAILY   LORazepam 0.5 MG tablet Commonly known as:  ATIVAN Take 1 mg by mouth. 1 tablet PO for agitation greater than 10 mint, may repeat in 30 mins.   mometasone 0.1 % lotion Commonly known as:  ELOCON Apply topically daily.   orphenadrine 100 MG tablet Commonly known as:  NORFLEX Take 1 tablet (100 mg total) by mouth 2 (two) times daily.   PROMOD Liqd MIX 1 OZ (30 ML) WITH 1 OZ WATER, DRINK TWICE DAILY  FOR PROTEIN SUPPLEMENT   REFRESH 1 % ophthalmic solution Generic drug:  carboxymethylcellulose INSTILL 1 DROP INTO EACH EYE 3 TIMES A DAY   SEROQUEL 200 MG tablet Generic drug:  QUEtiapine Take 200 mg by mouth daily. 4 pm (Dr. Ave Filter, Psychiatry)   tamsulosin 0.4 MG Caps capsule Commonly known as:  FLOMAX Take 1 capsule (0.4 mg total) by mouth daily.   traZODone 50 MG tablet Commonly known as:  DESYREL Take 1 tablet (50 mg total) by mouth at bedtime.       Allergies: No Known Allergies  Family History: Family History  Problem Relation Age of Onset  . Kidney disease Neg Hx     Social History:  reports that he has quit smoking. His smoking use included pipe. He has never used smokeless tobacco. He reports that he does not drink alcohol or use drugs.  ROS: UROLOGY Frequent Urination?: No Hard to postpone urination?: No Burning/pain with urination?: No Get up at night to urinate?: Yes Leakage of urine?: No Urine stream starts and stops?: No Trouble starting stream?: No Do you have to strain to urinate?: No Blood in urine?: No Urinary tract infection?: No Sexually transmitted disease?: No Injury to kidneys or bladder?: No Painful intercourse?: No Weak stream?: No Erection problems?: No Penile pain?: No  Gastrointestinal Nausea?: No Vomiting?: No Indigestion/heartburn?: No Diarrhea?: No Constipation?: No  Constitutional Fever: No Night sweats?: No Weight loss?: No Fatigue?: No  Skin Skin rash/lesions?: No Itching?: No  Eyes Blurred vision?: No Double vision?: No  Ears/Nose/Throat Sore throat?: No Sinus problems?: No  Hematologic/Lymphatic Swollen glands?: No Easy bruising?: No  Cardiovascular Leg swelling?: No Chest pain?: No  Respiratory Cough?: No Shortness of breath?: No  Endocrine Excessive thirst?: No  Musculoskeletal Back pain?: No Joint pain?: No  Neurological Headaches?: No Dizziness?: No  Psychologic Depression?:  No Anxiety?: No  Physical Exam: BP 112/66   Pulse 86   Ht 5\' 3"  (1.6 m)   Wt 123 lb (55.8 kg)   BMI 21.79 kg/m   Constitutional: Well nourished. Alert and oriented, No acute distress. HEENT: [place] AT, moist mucus membranes. Trachea midline, no masses. Cardiovascular: No clubbing, cyanosis, or edema. Respiratory: Normal respiratory effort, no increased work of breathing. GI: Abdomen is soft, non tender, non  distended, no abdominal masses. Liver and spleen not palpable.  No hernias appreciated.  Stool sample for occult testing is not indicated.   GU: No CVA tenderness.  No bladder fullness or masses.  Patient with circumcised phallus.  Urethral meatus is patent.  No penile discharge. No penile lesions or rashes. Scrotum without lesions, cysts, rashes and/or edema.  Testicles are located scrotally bilaterally. No masses are appreciated in the testicles. Left and right epididymis are normal. Rectal: Patient with  normal sphincter tone. Anus and perineum without scarring or rashes. No rectal masses are appreciated. Prostate is approximately 45 grams, no nodules are appreciated. Seminal vesicles are normal. Skin: No rashes, bruises or suspicious lesions. Lymph: No cervical or inguinal adenopathy. Neurologic: Grossly intact, no focal deficits, moving all 4 extremities. Psychiatric: Normal mood and affect.   Laboratory Data: Lab Results  Component Value Date   WBC 7.7 03/18/2018   HGB 12.2 (L) 03/18/2018   HCT 36.2 (L) 03/18/2018   MCV 89.8 03/18/2018   PLT 370 03/18/2018    Lab Results  Component Value Date   CREATININE 0.81 03/18/2018     Results for orders placed or performed in visit on 07/31/18  Bladder Scan (Post Void Residual) in office  Result Value Ref Range   Scan Result 36     Previous PSA's:      0.3 ng/mL on 05/11/2014      0.3 ng/mL on 06/21/2015      0.2 ng/mL on 06/20/2016      0.3 ng/mL on 06/2017  I have reviewed the labs  Pertinent Imaging Results for  Aaron Foster, Aaron Foster (MRN [id number]) as of 07/31/2018 12:01  Ref. Range 07/31/2018 12:00  Scan Result Unknown 36    Assessment & Plan:    1. BPH (benign prostatic hyperplasia) with LUTS:     Patient's IPSS score is 0/0.  His PVR 40 mL.  His DRE demonstrates enlargement.  He will continue the tamsulosin and refill was sent to his pharmacy.   He will follow up in 12 months for a PSA, DRE, PVR and an IPSS.  - if PSA is stable   - PSA - BLADDER SCAN AMB NON-IMAGING   Return in about 1 year (around 08/01/2019) for IPSS, PSA and exam.  Michiel Cowboy, [facility]  [facility] [address] [place], [place] [address] [phone]

## 2018-08-01 LAB — PSA: Prostate Specific Ag, Serum: 0.3 ng/mL (ref 0.0–4.0)

## 2018-08-12 ENCOUNTER — Encounter: Payer: Self-pay | Admitting: Family Medicine

## 2018-08-12 ENCOUNTER — Ambulatory Visit (INDEPENDENT_AMBULATORY_CARE_PROVIDER_SITE_OTHER): Payer: Medicare Other | Admitting: Family Medicine

## 2018-08-12 VITALS — BP 110/68 | HR 88 | Temp 97.8°F | Resp 18 | Ht 63.0 in | Wt 125.3 lb

## 2018-08-12 DIAGNOSIS — G894 Chronic pain syndrome: Secondary | ICD-10-CM

## 2018-08-12 DIAGNOSIS — R2681 Unsteadiness on feet: Secondary | ICD-10-CM

## 2018-08-12 NOTE — Progress Notes (Signed)
Name: Aaron Foster   MRN: [id number]    DOB: Apr 03, 1946   Date:08/12/2018       Progress Note  Subjective  Chief Complaint  Chief Complaint  Patient presents with  . 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 He seems happier ( per caregiver that came in with him today - [name]) . 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 OSA on CPAP 06/23/2018  . OSA (obstructive sleep apnea) 03/18/2018  . Vitamin D deficiency 04/08/2017  . Scoliosis 02/22/2016  . Recurrent falls 08/04/2015  . Dermatitis seborrheica 06/24/2015  . Chronic constipation 06/24/2015  . Lives in group home 06/24/2015  . Protein calorie malnutrition (HCC) 06/24/2015  . Hearing loss of both ears 06/24/2015  . Wears hearing aid 06/24/2015  . History of iron deficiency anemia 06/24/2015  . Osteoporosis 06/24/2015  . Adult behavior problem 06/24/2015  . Hiatal hernia 06/24/2015  . Hemorrhoid 06/24/2015  . Gait instability 06/24/2015  . DDD (degenerative disc disease), lumbar 06/24/2015  . Nephrolithiasis 06/24/2015  . Hyperlipidemia 06/24/2015  . Compression fracture of L1 lumbar vertebra (HCC) 06/24/2015  . Family history of upper GI bleeding   . BPH with obstruction/lower urinary tract symptoms 06/21/2015  . Chronic pain 06/19/2015  . Acid reflux 06/19/2015  . Intellectual disability 06/19/2015    Past Surgical History:  Procedure Laterality Date  . arm fracture    . circumscision      Family History  Problem Relation Age of Onset  . Kidney disease Neg Hx     Social History   Socioeconomic History  . Marital status: Single    Spouse name: Not on file  . Number of children: 0  . Years of education: unknown  . Highest education level:  Not on file  Occupational History  . Occupation: Retired  Engineer, production  . Financial resource strain: Not hard at all  . Food insecurity:    Worry: Never true    Inability: Never true  . Transportation needs:    Medical: No    Non-medical: No  Tobacco Use  . Smoking status: Former Smoker    Types: Pipe  . Smokeless tobacco: Never Used  . Tobacco comment: unknown smoking history; smoking cessation materials not required  Substance and Sexual Activity  . Alcohol use: No    Alcohol/week: 0.0 standard drinks  . Drug use: No  . Sexual activity: Not Currently  Lifestyle  . Physical activity:    Days per week: 0 days    Minutes per session: 0 min  . Stress: Not at all  Relationships  . Social connections:    Talks on phone: Patient refused    Gets together: Patient refused    Attends religious service: Patient refused    Active member of club or organization: Patient refused    Attends meetings of clubs or organizations: Patient refused    Relationship status: Patient refused  . Intimate partner violence:    Fear of current or ex partner: No    Emotionally abused: No    Physically abused: No    Forced sexual activity: No  Other Topics Concern  . Not on file  Social History Narrative  .  Not on file     Current Outpatient Medications:  .  acetaminophen (MAPAP) 500 MG tablet, TAKE 2 TABLETS BY MOUTH 3 TIMES DAILY FOR PAIN, Disp: 180 tablet, Rfl: 1 .  AMITIZA 24 MCG capsule, TAKE 1 CAPSULE BY MOUTH 2 TIMES DAILY WITH A MEAL. (Patient taking differently: TAKE 1 CAPSULE BY MOUTH 2 TIMES DAILY), Disp: 60 capsule, Rfl: 5 .  Calcium Carbonate-Vitamin D3 600-400 MG-UNIT TABS, Take by mouth., Disp: , Rfl:  .  Cholecalciferol 1000 units tablet, Take 1 tablet (1,000 Units total) by mouth daily., Disp: 90 tablet, Rfl: 2 .  DULoxetine (CYMBALTA) 30 MG capsule, Take 90 mg by mouth daily., Disp: , Rfl:  .  esomeprazole (NEXIUM) 40 MG capsule, TAKE 1 CAPSULE BY MOUTH AT LEAST 30 MINUTES  BEFORE FOOD, ONCE DAILY FOR REFLUX., Disp: 30 capsule, Rfl: 5 .  gabapentin (NEURONTIN) 100 MG capsule, Take 200 mg by mouth 3 (three) times daily., Disp: , Rfl:  .  GNP ASPIRIN LOW DOSE 81 MG EC tablet, TAKE 1 TABLET BY MOUTH ONCE DAILY FOR CIRCULATION, Disp: 30 tablet, Rfl: 5 .  ibuprofen (ADVIL,MOTRIN) 800 MG tablet, Take 800 mg by mouth every 6 (six) hours as needed., Disp: , Rfl:  .  ketoconazole (NIZORAL) 2 % cream, APPLY TOPICALLY TO FACE SPARINGLY TWICE DAILY, Disp: 60 g, Rfl: 11 .  ketoconazole (NIZORAL) 2 % shampoo, SHAMPOO HAIR 3 TIMES WEEKLY FOR FUNGAL INFECTION, Disp: 120 mL, Rfl: 5 .  LORazepam (ATIVAN) 0.5 MG tablet, Take 1 mg by mouth. 1 tablet PO for agitation greater than 10 mint, may repeat in 30 mins., Disp: , Rfl:  .  mometasone (ELOCON) 0.1 % lotion, Apply topically daily., Disp: , Rfl:  .  Nutritional Supplements (PROMOD) LIQD, MIX 1 OZ (30 ML) WITH 1 OZ WATER, DRINK TWICE DAILY FOR PROTEIN SUPPLEMENT, Disp: 1892 mL, Rfl: 12 .  orphenadrine (NORFLEX) 100 MG tablet, Take 1 tablet (100 mg total) by mouth 2 (two) times daily., Disp: 60 tablet, Rfl: 0 .  QUEtiapine (SEROQUEL) 200 MG tablet, Take 200 mg by mouth daily. 4 pm (Dr. Ave Filter, Psychiatry), Disp: , Rfl:  .  REFRESH 1 % ophthalmic solution, INSTILL 1 DROP INTO EACH EYE 3 TIMES A DAY, Disp: 15 mL, Rfl: 12 .  tamsulosin (FLOMAX) 0.4 MG CAPS capsule, Take 1 capsule (0.4 mg total) by mouth daily., Disp: 30 capsule, Rfl: 5 .  traZODone (DESYREL) 50 MG tablet, Take 1 tablet (50 mg total) by mouth at bedtime., Disp: 30 tablet, Rfl: 2  No Known Allergies  I personally reviewed active problem list, medication list, allergies, family history, social history with the patient/caregiver today.   ROS  Ten systems reviewed and is negative except as mentioned in HPI   Objective  Vitals:   08/12/18 1038  BP: 110/68  Pulse: 88  Resp: 18  Temp: 97.8 F (36.6 C)  TempSrc: Oral  SpO2: 99%  Weight: 125 lb 4.8 oz (56.8 kg)   Height: 5\' 3"  (1.6 m)    Body mass index is 22.2 kg/m.  Physical Exam  Constitutional: Patient appears well-developed and frail, No distress.  HEENT: head atraumatic, normocephalic, pupils equal and reactive to light, ears normal TM, neck decrease in rom , throat within normal limits Cardiovascular: Normal rate, regular rhythm and normal heart sounds.  No murmur heard. No BLE edema. Pulmonary/Chest: Effort normal and breath sounds normal. No respiratory distress. Abdominal: Soft.  There is no tenderness.Negative CVA,  Muscular skeletal: shuffles his feet while  walking, decrease rom of arms, neck, back  Psychiatric: Patient has a normal mood and affect. behavior is normal. Judgment and thought content normal.   Recent Results (from the past 2160 hour(s))  PSA     Status: None   Collection Time: 07/31/18 11:44 AM  Result Value Ref Range   Prostate Specific Ag, Serum 0.3 0.0 - 4.0 ng/mL    Comment: Roche ECLIA methodology. According to the American Urological Association, Serum PSA should decrease and remain at undetectable levels after radical prostatectomy. The AUA defines biochemical recurrence as an initial PSA value 0.2 ng/mL or greater followed by a subsequent confirmatory PSA value 0.2 ng/mL or greater. Values obtained with different assay methods or kits cannot be used interchangeably. Results cannot be interpreted as absolute evidence of the presence or absence of malignant disease.   Bladder Scan (Post Void Residual) in office     Status: None   Collection Time: 07/31/18 12:00 PM  Result Value Ref Range   Scan Result 36    PHQ2/9: Depression screen [facility] 2/9 02/25/2017 02/22/2016 06/24/2015  Decreased Interest 0 0 0  Down, Depressed, Hopeless 0 0 0  PHQ - 2 Score 0 0 0    Fall Risk: Fall Risk  04/03/2018 03/18/2018 03/18/2018 11/18/2017 08/14/2017  Falls in the past year? Yes Yes No No Yes  Number falls in past yr: 2 or more 2 or more - - 2 or more  Injury with Fall?  No Yes - - No  Risk Factor Category  High Fall Risk High Fall Risk - - -  Risk for fall due to : Impaired vision;History of fall(s);Impaired balance/gait;Medication side effect;Impaired mobility History of fall(s);Impaired balance/gait;Impaired mobility Impaired balance/gait - Impaired balance/gait;Impaired mobility;Medication side effect  Risk for fall due to: Comment wears eyeglasses - - - -  Follow up Falls evaluation completed;Education provided;Falls prevention discussed Falls prevention discussed - - Education provided      Assessment & Plan  1. Chronic pain syndrome   2. Gait instability  Forms filled out

## 2018-08-21 ENCOUNTER — Other Ambulatory Visit: Payer: Self-pay | Admitting: Urology

## 2018-08-21 DIAGNOSIS — N401 Enlarged prostate with lower urinary tract symptoms: Secondary | ICD-10-CM

## 2018-08-29 ENCOUNTER — Other Ambulatory Visit: Payer: Self-pay | Admitting: Family Medicine

## 2018-08-29 DIAGNOSIS — R4689 Other symptoms and signs involving appearance and behavior: Secondary | ICD-10-CM

## 2018-09-30 ENCOUNTER — Ambulatory Visit (INDEPENDENT_AMBULATORY_CARE_PROVIDER_SITE_OTHER): Payer: Medicare Other

## 2018-09-30 DIAGNOSIS — Z23 Encounter for immunization: Secondary | ICD-10-CM

## 2018-10-23 ENCOUNTER — Other Ambulatory Visit: Payer: Self-pay | Admitting: Family Medicine

## 2018-10-27 ENCOUNTER — Other Ambulatory Visit: Payer: Self-pay | Admitting: Family Medicine

## 2018-10-27 ENCOUNTER — Other Ambulatory Visit: Payer: Self-pay

## 2018-10-27 DIAGNOSIS — G894 Chronic pain syndrome: Secondary | ICD-10-CM

## 2018-10-27 DIAGNOSIS — E559 Vitamin D deficiency, unspecified: Secondary | ICD-10-CM

## 2018-10-27 NOTE — Telephone Encounter (Signed)
Called in to pharmacy with verbal order per PCP.

## 2018-11-21 ENCOUNTER — Ambulatory Visit (INDEPENDENT_AMBULATORY_CARE_PROVIDER_SITE_OTHER): Payer: Medicare Other | Admitting: Family Medicine

## 2018-11-21 ENCOUNTER — Telehealth: Payer: Self-pay | Admitting: Family Medicine

## 2018-11-21 ENCOUNTER — Encounter: Payer: Self-pay | Admitting: Family Medicine

## 2018-11-21 VITALS — BP 120/70 | HR 82 | Temp 97.7°F | Resp 16 | Ht 63.0 in | Wt 122.7 lb

## 2018-11-21 DIAGNOSIS — K219 Gastro-esophageal reflux disease without esophagitis: Secondary | ICD-10-CM | POA: Diagnosis not present

## 2018-11-21 DIAGNOSIS — M545 Low back pain: Secondary | ICD-10-CM | POA: Diagnosis not present

## 2018-11-21 DIAGNOSIS — E441 Mild protein-calorie malnutrition: Secondary | ICD-10-CM | POA: Diagnosis not present

## 2018-11-21 DIAGNOSIS — E559 Vitamin D deficiency, unspecified: Secondary | ICD-10-CM

## 2018-11-21 DIAGNOSIS — L219 Seborrheic dermatitis, unspecified: Secondary | ICD-10-CM | POA: Diagnosis not present

## 2018-11-21 DIAGNOSIS — G4733 Obstructive sleep apnea (adult) (pediatric): Secondary | ICD-10-CM

## 2018-11-21 DIAGNOSIS — G8929 Other chronic pain: Secondary | ICD-10-CM | POA: Diagnosis not present

## 2018-11-21 DIAGNOSIS — G894 Chronic pain syndrome: Secondary | ICD-10-CM | POA: Diagnosis not present

## 2018-11-21 DIAGNOSIS — R2681 Unsteadiness on feet: Secondary | ICD-10-CM

## 2018-11-21 DIAGNOSIS — D638 Anemia in other chronic diseases classified elsewhere: Secondary | ICD-10-CM | POA: Diagnosis not present

## 2018-11-21 DIAGNOSIS — E7800 Pure hypercholesterolemia, unspecified: Secondary | ICD-10-CM

## 2018-11-21 MED ORDER — ESOMEPRAZOLE MAGNESIUM 20 MG PO CPDR: 20.0000 mg | DELAYED_RELEASE_CAPSULE | Freq: Every day | ORAL | 5 refills | Status: DC

## 2018-11-21 MED ORDER — KETOCONAZOLE 2 % EX CREA: TOPICAL_CREAM | Freq: Every day | CUTANEOUS | 11 refills | Status: AC

## 2018-11-21 MED ORDER — KETOCONAZOLE 2 % EX SHAM: MEDICATED_SHAMPOO | CUTANEOUS | 5 refills | Status: AC

## 2018-11-21 NOTE — Progress Notes (Signed)
Name: Aaron RoverJoseph J Krisko   MRN: [id number]    DOB: [birth year]   Date:11/21/2018       Progress Note  Subjective  Chief Complaint  Chief Complaint  Patient presents with  . Hyperlipidemia  . Sleep Apnea    HPI  He lives at a group home [facility] and came in today with caregiver: Marigene EhlersBarbara Harris  Hearing loss: sees Dr. Andee PolesVaught, he does not like wearing it. Unchanged   Protein Calorie malnutrition: he is pick eaterhe is taking protein supplement three times a day, his weight is down 2.5 lbs since last visit, however weight is up from one year ago when he is down to 114 lbs. Continue supplements. He was also eating more when taking CBD oils given by psychiatrist but has been out of supplement  Chronic pain: has a brace on left wrist andusually on hisright knee( but not wearing it today), had injections on right knee int he past He has chronic back pain and istaking Norflex, unfortunately insurance is not covering lidoderm patches, he continues to complain of pain in different spots, Britta MccreedyBarbara states random, not sure if CBD helped. Today he was rocking on the exam table and started to complain of back pain when I walked in.   Depression: still under the care of Dr. Ave Filterhandler, taking medications as prescribed and also on CBD oils and mood has been good lately, smiling during visit today, he has not been throwing his walker as often lately.   Gait instability: using a walker, he is having PT and he is cooperative most of the time.   Anemia:of chronic disease, last labs stable, no SOB. Unchanged   Chronic constipation: taking medication and has regular bowel movements, every other day, no straining, blood in stools or pain.Taking prune juice daily and prunes on sundays  Dermatitis seborrheic:using medication. Unchanged   OSA: found results on Dr. Margaretmary EddyShah's visit, he is on CPAP every night now and Britta MccreedyBarbara states he is compliant except that sometimes when he is mouth  breathing the machine does not pick it up. She thinks his behavior has improved with CPAP  Vitamin D : still taking supplements  GERD: he is a poor communicator, mostly gestures but does not seem to have any abdominal pain, we will try going down on Nexium from 40 mg to 20 mg and monitor  Patient Active Problem List   Diagnosis Date Noted  . OSA on CPAP 06/23/2018  . OSA (obstructive sleep apnea) 03/18/2018  . Vitamin D deficiency 04/08/2017  . Scoliosis 02/22/2016  . Recurrent falls 08/04/2015  . Dermatitis seborrheica 06/24/2015  . Chronic constipation 06/24/2015  . Lives in group home 06/24/2015  . Protein calorie malnutrition (HCC) 06/24/2015  . Hearing loss of both ears 06/24/2015  . Wears hearing aid 06/24/2015  . History of iron deficiency anemia 06/24/2015  . Osteoporosis 06/24/2015  . Adult behavior problem 06/24/2015  . Hiatal hernia 06/24/2015  . Hemorrhoid 06/24/2015  . Gait instability 06/24/2015  . DDD (degenerative disc disease), lumbar 06/24/2015  . Nephrolithiasis 06/24/2015  . Hyperlipidemia 06/24/2015  . Compression fracture of L1 lumbar vertebra (HCC) 06/24/2015  . Family history of upper GI bleeding   . BPH with obstruction/lower urinary tract symptoms 06/21/2015  . Chronic pain 06/19/2015  . Acid reflux 06/19/2015  . Intellectual disability 06/19/2015    Past Surgical History:  Procedure Laterality Date  . arm fracture    . circumscision      Family History  Problem Relation Age of Onset  . Kidney disease Neg Hx     Social History   Socioeconomic History  . Marital status: Single    Spouse name: Not on file  . Number of children: 0  . Years of education: unknown  . Highest education level: Not on file  Occupational History  . Occupation: Retired  Engineer, productionocial Needs  . Financial resource strain: Not hard at all  . Food insecurity:    Worry: Never true    Inability: Never true  . Transportation needs:    Medical: No    Non-medical: No   Tobacco Use  . Smoking status: Former Smoker    Types: Pipe  . Smokeless tobacco: Never Used  . Tobacco comment: unknown smoking history; smoking cessation materials not required  Substance and Sexual Activity  . Alcohol use: No    Alcohol/week: 0.0 standard drinks  . Drug use: No  . Sexual activity: Not Currently  Lifestyle  . Physical activity:    Days per week: 0 days    Minutes per session: 0 min  . Stress: Not at all  Relationships  . Social connections:    Talks on phone: Patient refused    Gets together: Patient refused    Attends religious service: Patient refused    Active member of club or organization: Patient refused    Attends meetings of clubs or organizations: Patient refused    Relationship status: Patient refused  . Intimate partner violence:    Fear of current or ex partner: No    Emotionally abused: No    Physically abused: No    Forced sexual activity: No  Other Topics Concern  . Not on file  Social History Narrative  . Not on file     Current Outpatient Medications:  .  acetaminophen (ACETAMINOPHEN EXTRA STRENGTH) 500 MG tablet, TAKE 2 TABLETS BY MOUTH 3 TIMES DAILY FOR PAIN, Disp: 90 tablet, Rfl: 5 .  AMITIZA 24 MCG capsule, TAKE 1 CAPSULE BY MOUTH 2 TIMES DAILY WITH A MEAL. (Patient taking differently: TAKE 1 CAPSULE BY MOUTH 2 TIMES DAILY), Disp: 60 capsule, Rfl: 5 .  Calcium Carbonate-Vitamin D3 600-400 MG-UNIT TABS, Take by mouth., Disp: , Rfl:  .  Cholecalciferol 1000 units tablet, Take 1 tablet (1,000 Units total) by mouth daily., Disp: 90 tablet, Rfl: 2 .  DULoxetine (CYMBALTA) 30 MG capsule, Take 90 mg by mouth daily., Disp: , Rfl:  .  esomeprazole (NEXIUM) 20 MG capsule, Take 1 capsule (20 mg total) by mouth daily., Disp: 30 capsule, Rfl: 5 .  gabapentin (NEURONTIN) 100 MG capsule, Take 200 mg by mouth 3 (three) times daily., Disp: , Rfl:  .  GNP ASPIRIN LOW DOSE 81 MG EC tablet, TAKE 1 TABLET BY MOUTH ONCE DAILY FOR CIRCULATION, Disp: 30  tablet, Rfl: 5 .  ibuprofen (ADVIL,MOTRIN) 800 MG tablet, Take 800 mg by mouth every 6 (six) hours as needed., Disp: , Rfl:  .  ketoconazole (NIZORAL) 2 % cream, Apply topically daily., Disp: 60 g, Rfl: 11 .  ketoconazole (NIZORAL) 2 % shampoo, SHAMPOO HAIR 3 TIMES WEEKLY FOR FUNGAL INFECTION, Disp: 200 mL, Rfl: 5 .  LORazepam (ATIVAN) 0.5 MG tablet, Take 1 mg by mouth. 1 tablet PO for agitation greater than 10 mint, may repeat in 30 mins., Disp: , Rfl:  .  mometasone (ELOCON) 0.1 % lotion, Apply topically daily., Disp: , Rfl:  .  Nutritional Supplements (PROMOD) LIQD, MIX 1 OZ (30 ML) WITH 1 OZ  WATER, DRINK TWICE DAILY FOR PROTEIN SUPPLEMENT, Disp: 1.892 mL, Rfl: 12 .  orphenadrine (NORFLEX) 100 MG tablet, TAKE 1 CAPSULE BY MOUTH 2 TIMES DAILY FOR PAIN, Disp: 60 tablet, Rfl: 5 .  QUEtiapine (SEROQUEL) 200 MG tablet, Take 200 mg by mouth daily. 4 pm (Dr. Ave Filter, Psychiatry), Disp: , Rfl:  .  REFRESH 1 % ophthalmic solution, INSTILL 1 DROP INTO EACH EYE 3 TIMES A DAY, Disp: 15 mL, Rfl: 12 .  tamsulosin (FLOMAX) 0.4 MG CAPS capsule, Take 1 capsule (0.4 mg total) by mouth daily., Disp: 30 capsule, Rfl: 5 .  traZODone (DESYREL) 50 MG tablet, TAKE 1 TABLET BY MOUTH EACH DAY AT BEDTIME FOR SLEEP, Disp: 30 tablet, Rfl: 5  No Known Allergies  I personally reviewed active problem list, medication list, allergies, family history, social history with the patient/caregiver today.   ROS  Constitutional: Negative for fever or significant weight change.  Respiratory: Negative for cough and shortness of breath.   Cardiovascular: Negative for chest pain or palpitations.  Gastrointestinal: Negative for abdominal pain, no bowel changes.  Musculoskeletal: Positive  for gait problem but no  joint swelling.  Skin: Positive for rash - seborrhea .  Neurological: Negative for dizziness or headache.  No other specific complaints in a complete review of systems (except as listed in HPI  above).  Objective  Vitals:   11/21/18 1020  BP: 120/70  Pulse: 82  Resp: 16  Temp: 97.7 F (36.5 C)  TempSrc: Oral  SpO2: 97%  Weight: 122 lb 11.2 oz (55.7 kg)  Height: 5\' 3"  (1.6 m)    Body mass index is 21.74 kg/m.  Physical Exam  Constitutional: Patient appears well-developed and well-nourished. No distress.  HEENT: head atraumatic, normocephalic, pupils equal and reactive to light, neck supple, throat within normal limits Cardiovascular: Normal rate, regular rhythm and normal heart sounds.  No murmur heard. No BLE edema. Pulmonary/Chest: Effort normal and breath sounds normal. No respiratory distress. Abdominal: Soft.  There is no tenderness. Muscular Skeletal: brace on left wrist, holding right lower back for a bit, but when distracted stopped complaining of pain, slow gait  Psychiatric: Patient has a normal mood and affect. behavior is normal. Judgment and thought content normal.  PHQ2/9: Depression screen [facility] 2/9 11/21/2018 02/25/2017 02/22/2016 06/24/2015  Decreased Interest 0 0 0 0  Down, Depressed, Hopeless 0 0 0 0  PHQ - 2 Score 0 0 0 0   Answered by caregiver   Fall Risk: Fall Risk  04/03/2018 03/18/2018 03/18/2018 11/18/2017 08/14/2017  Falls in the past year? Yes Yes No No Yes  Number falls in past yr: 2 or more 2 or more - - 2 or more  Injury with Fall? No Yes - - No  Risk Factor Category  High Fall Risk High Fall Risk - - -  Risk for fall due to : Impaired vision;History of fall(s);Impaired balance/gait;Medication side effect;Impaired mobility History of fall(s);Impaired balance/gait;Impaired mobility Impaired balance/gait - Impaired balance/gait;Impaired mobility;Medication side effect  Risk for fall due to: Comment wears eyeglasses - - - -  Follow up Falls evaluation completed;Education provided;Falls prevention discussed Falls prevention discussed - - Education provided      Assessment & Plan  1. Gait instability  Continue PT  2. Chronic pain  syndrome  stable  3. Mild protein-calorie malnutrition (HCC)  Hopefully he will gain again with CBD oil   4. Anemia of chronic disease   5. Vitamin D deficiency  Continue supplements  6. Dermatitis seborrheica  -  ketoconazole (NIZORAL) 2 % shampoo; SHAMPOO HAIR 3 TIMES WEEKLY FOR FUNGAL INFECTION  Dispense: 200 mL; Refill: 5 - ketoconazole (NIZORAL) 2 % cream; Apply topically daily.  Dispense: 60 g; Refill: 11  7. OSA (obstructive sleep apnea)  Wears it every night   8. Chronic bilateral low back pain without sciatica  stable  9. Pure hypercholesterolemia   10. GERD without esophagitis  We will decrease dose of Nexium and monitor if symptoms returns  - esomeprazole (NEXIUM) 20 MG capsule; Take 1 capsule (20 mg total) by mouth daily.  Dispense: 30 capsule; Refill: 5

## 2018-11-21 NOTE — Telephone Encounter (Signed)
Copied from CRM [id number]. Topic: Quick Communication - Rx Refill/Question >> Nov 21, 2018  3:33 PM [place], [place] D wrote: Medication: esomeprazole (NEXIUM) 20 MG capsule / Maisie Fus with [facility] wanted to confirm dosage of Nexium stating the previous rx was for 40MG . Please advise.  Has the patient contacted their pharmacy? Yes.   (Agent: If no, request that the patient contact the pharmacy for the refill.) (Agent: If yes, when and what did the pharmacy advise?)  Preferred Pharmacy (with phone number or street name): [facility] - [place], [place] - [address] [phone] (Phone) [phone] (Fax)  Agent: Please be advised that RX refills may take up to 3 business days. We ask that you follow-up with your pharmacy.

## 2018-11-21 NOTE — Telephone Encounter (Signed)
Please review

## 2018-11-27 ENCOUNTER — Other Ambulatory Visit: Payer: Self-pay | Admitting: Family Medicine

## 2018-11-27 DIAGNOSIS — K5909 Other constipation: Secondary | ICD-10-CM

## 2018-11-27 DIAGNOSIS — R4689 Other symptoms and signs involving appearance and behavior: Secondary | ICD-10-CM

## 2018-11-28 ENCOUNTER — Telehealth: Payer: Self-pay | Admitting: Family Medicine

## 2018-11-28 NOTE — Telephone Encounter (Signed)
Copied from CRM [id number]. Topic: Quick Communication - See Telephone Encounter >> Nov 28, 2018 10:26 AM Jens SomMedley, [place] A wrote: CRM for notification. See Telephone encounter for: 11/28/18.  Walid from [facility] is calling regarding orphenadrine (NORFLEX) 100 MG tablet [[id number]][[id number]] is not covered by the insurance.  Can request a PA. But states that there are alternative medication are available. Cyclobenzaprine or Tizanidine. Please advise.  If you do switch to those medications. Would need a Discontinued order for the Norflex. And a new script needed as well.  Please advise with Pharmacy either way. [phone]

## 2018-11-30 NOTE — Telephone Encounter (Signed)
Please change to Tizanidine 2 mg every 8 hours, stop norflex. Thank you

## 2018-12-01 ENCOUNTER — Other Ambulatory Visit: Payer: Self-pay | Admitting: Family Medicine

## 2018-12-01 MED ORDER — TIZANIDINE HCL 2 MG PO TABS: 2.0000 mg | ORAL_TABLET | Freq: Three times a day (TID) | ORAL | 5 refills | Status: DC

## 2018-12-09 DIAGNOSIS — F419 Anxiety disorder, unspecified: Secondary | ICD-10-CM | POA: Diagnosis not present

## 2018-12-18 ENCOUNTER — Other Ambulatory Visit: Payer: Self-pay

## 2018-12-18 DIAGNOSIS — M62838 Other muscle spasm: Secondary | ICD-10-CM

## 2018-12-18 MED ORDER — ORPHENADRINE CITRATE ER 100 MG PO TB12: 100.0000 mg | ORAL_TABLET | Freq: Two times a day (BID) | ORAL | 5 refills | Status: DC

## 2018-12-18 NOTE — Telephone Encounter (Signed)
They are aware that PA was not approved. They have agreed to cover the cost of this medication. Group home would like to submit another PA with interaction potential plus he has not had a good outcome with other medications.

## 2018-12-22 ENCOUNTER — Telehealth: Payer: Self-pay

## 2018-12-22 NOTE — Telephone Encounter (Signed)
Prior Authorization was initiated for Orphenadrine Citrate ER 100 Mg Tablet ER 12 HR and Approved. The medication is approved through until further notice. Group home has been notified to contact pharmacy for medication.

## 2019-01-01 DIAGNOSIS — F419 Anxiety disorder, unspecified: Secondary | ICD-10-CM | POA: Diagnosis not present

## 2019-01-26 ENCOUNTER — Other Ambulatory Visit: Payer: Self-pay | Admitting: Family Medicine

## 2019-01-26 ENCOUNTER — Other Ambulatory Visit: Payer: Self-pay | Admitting: Urology

## 2019-01-26 DIAGNOSIS — N401 Enlarged prostate with lower urinary tract symptoms: Secondary | ICD-10-CM

## 2019-01-26 DIAGNOSIS — E559 Vitamin D deficiency, unspecified: Secondary | ICD-10-CM

## 2019-02-28 ENCOUNTER — Other Ambulatory Visit: Payer: Self-pay | Admitting: Family Medicine

## 2019-03-23 ENCOUNTER — Ambulatory Visit (INDEPENDENT_AMBULATORY_CARE_PROVIDER_SITE_OTHER): Payer: Medicare Other | Admitting: Family Medicine

## 2019-03-23 ENCOUNTER — Other Ambulatory Visit: Payer: Self-pay

## 2019-03-23 ENCOUNTER — Encounter: Payer: Self-pay | Admitting: Family Medicine

## 2019-03-23 VITALS — BP 102/62 | HR 70 | Temp 98.4°F | Resp 16 | Ht 63.0 in | Wt 114.4 lb

## 2019-03-23 DIAGNOSIS — Z593 Problems related to living in residential institution: Secondary | ICD-10-CM | POA: Diagnosis not present

## 2019-03-23 DIAGNOSIS — M545 Low back pain, unspecified: Secondary | ICD-10-CM

## 2019-03-23 DIAGNOSIS — R2681 Unsteadiness on feet: Secondary | ICD-10-CM | POA: Diagnosis not present

## 2019-03-23 DIAGNOSIS — K5909 Other constipation: Secondary | ICD-10-CM

## 2019-03-23 DIAGNOSIS — G894 Chronic pain syndrome: Secondary | ICD-10-CM | POA: Diagnosis not present

## 2019-03-23 DIAGNOSIS — D638 Anemia in other chronic diseases classified elsewhere: Secondary | ICD-10-CM | POA: Diagnosis not present

## 2019-03-23 DIAGNOSIS — G8929 Other chronic pain: Secondary | ICD-10-CM

## 2019-03-23 DIAGNOSIS — G4733 Obstructive sleep apnea (adult) (pediatric): Secondary | ICD-10-CM

## 2019-03-23 DIAGNOSIS — Z789 Other specified health status: Secondary | ICD-10-CM

## 2019-03-23 DIAGNOSIS — K219 Gastro-esophageal reflux disease without esophagitis: Secondary | ICD-10-CM

## 2019-03-23 DIAGNOSIS — R634 Abnormal weight loss: Secondary | ICD-10-CM | POA: Diagnosis not present

## 2019-03-23 DIAGNOSIS — E559 Vitamin D deficiency, unspecified: Secondary | ICD-10-CM

## 2019-03-23 DIAGNOSIS — E441 Mild protein-calorie malnutrition: Secondary | ICD-10-CM | POA: Diagnosis not present

## 2019-03-23 DIAGNOSIS — E7800 Pure hypercholesterolemia, unspecified: Secondary | ICD-10-CM | POA: Diagnosis not present

## 2019-03-23 MED ORDER — PREMIER PROTEIN SHAKE: 11.0000 [oz_av] | Freq: Three times a day (TID) | ORAL | 5 refills | Status: DC

## 2019-03-23 NOTE — Progress Notes (Signed)
Name: Aaron Foster   MRN: [id number]    DOB: [birth year]   Date:03/23/2019       Progress Note  Subjective  Chief Complaint  Chief Complaint  Patient presents with  . Medication Refill  . Gait Instability  . Sleep Apnea  . Hyperlipidemia  . Pain  . Depression  . Anemia  . Gastroesophageal Reflux    HPI  He lives at a group home / [facility] and came in today with caregiver: [name]   Hearing loss: sees Dr. Andee Poles, hedoes not like wearing it.Unchanged , not wearing it today   Protein Calorie malnutrition: he is pick eaterhe is taking protein supplement three times a day, his weight is down again since last visit, and it was 114 lbs last year, now down to 112. We will recheck labs. Caregiver states he has a good appetite and is taking supplements twice daily.   [name] back on CBD oil given by Dr. Ave Filter twice daily  Chronic pain: has a brace on left wrist andusually on hisright knee( but not wearing it today), had injections on right kneeint he pastHe has chronic back pain and istaking Norflex, unfortunately insurance is not covering lidoderm patches, caregiver states not complaining much of pain at home, but here in the office he kept pointing to right wrist but was not in distress. Britta Mccreedy states random, not sure if CBD helped.   Depression: still under the care of Dr. Ave Filter, taking medications as prescribed and also on CBD oils and mood has been good lately at home, sometimes loses his patience, very seldom gets very upset.   Gait instability: using a walker, heis having PT and he is cooperative most of the time. Unchanged   Anemia:of chronic disease, last labs stable, no SOB.Recheck labs   Chronic constipation: taking medication and has regular bowel movements, every other day, no straining, blood in stools or pain.Taking prune juice daily and prunes on Sundays  Dermatitis seborrheic:using medication. Stable  OSA: found  results on Dr. Margaretmary Eddy visit, heis on CPAP every night now and Britta Mccreedy states he is compliant except that sometimes when he is mouth breathing the machine does not pick it up. Doing well   Vitamin D : still taking supplements, recheck level today  Patient Active Problem List   Diagnosis Date Noted  . OSA on CPAP 06/23/2018  . OSA (obstructive sleep apnea) 03/18/2018  . Vitamin D deficiency 04/08/2017  . Scoliosis 02/22/2016  . Recurrent falls 08/04/2015  . Dermatitis seborrheica 06/24/2015  . Chronic constipation 06/24/2015  . Lives in group home 06/24/2015  . Protein calorie malnutrition (HCC) 06/24/2015  . Hearing loss of both ears 06/24/2015  . Wears hearing aid 06/24/2015  . History of iron deficiency anemia 06/24/2015  . Osteoporosis 06/24/2015  . Adult behavior problem 06/24/2015  . Hiatal hernia 06/24/2015  . Hemorrhoid 06/24/2015  . Gait instability 06/24/2015  . DDD (degenerative disc disease), lumbar 06/24/2015  . Nephrolithiasis 06/24/2015  . Hyperlipidemia 06/24/2015  . Compression fracture of L1 lumbar vertebra (HCC) 06/24/2015  . Family history of upper GI bleeding   . BPH with obstruction/lower urinary tract symptoms 06/21/2015  . Chronic pain 06/19/2015  . Acid reflux 06/19/2015  . Intellectual disability 06/19/2015    Past Surgical History:  Procedure Laterality Date  . arm fracture    . circumscision      Family History  Problem Relation Age of Onset  . Kidney disease Neg Hx  Social History   Socioeconomic History  . Marital status: Single    Spouse name: Not on file  . Number of children: 0  . Years of education: unknown  . Highest education level: Not on file  Occupational History  . Occupation: Retired  Engineer, production  . Financial resource strain: Not hard at all  . Food insecurity:    Worry: Never true    Inability: Never true  . Transportation needs:    Medical: No    Non-medical: No  Tobacco Use  . Smoking status: Former Smoker     Types: Pipe  . Smokeless tobacco: Never Used  . Tobacco comment: unknown smoking history; smoking cessation materials not required  Substance and Sexual Activity  . Alcohol use: No    Alcohol/week: 0.0 standard drinks  . Drug use: No  . Sexual activity: Not Currently  Lifestyle  . Physical activity:    Days per week: 0 days    Minutes per session: 0 min  . Stress: Not at all  Relationships  . Social connections:    Talks on phone: Patient refused    Gets together: Patient refused    Attends religious service: Patient refused    Active member of club or organization: Patient refused    Attends meetings of clubs or organizations: Patient refused    Relationship status: Patient refused  . Intimate partner violence:    Fear of current or ex partner: No    Emotionally abused: No    Physically abused: No    Forced sexual activity: No  Other Topics Concern  . Not on file  Social History Narrative  . Not on file     Current Outpatient Medications:  .  acetaminophen (ACETAMINOPHEN EXTRA STRENGTH) 500 MG tablet, TAKE 2 TABLETS BY MOUTH 3 TIMES DAILY FOR PAIN, Disp: 90 tablet, Rfl: 5 .  AMITIZA 24 MCG capsule, TAKE 1 CAPSULE BY MOUTH 2 TIMES PER DAY WITH A MEAL, Disp: 60 capsule, Rfl: 5 .  Calcium Carbonate-Vitamin D3 600-400 MG-UNIT TABS, Take by mouth., Disp: , Rfl:  .  D3-1000 25 MCG (1000 UT) tablet, TAKE 1 TABLET BY MOUTH ONCE DAILY FOR SUPPLEMENT., Disp: 30 tablet, Rfl: 5 .  DULoxetine (CYMBALTA) 30 MG capsule, Take 90 mg by mouth daily., Disp: , Rfl:  .  esomeprazole (NEXIUM) 20 MG capsule, Take 1 capsule (20 mg total) by mouth daily., Disp: 30 capsule, Rfl: 5 .  gabapentin (NEURONTIN) 100 MG capsule, Take 200 mg by mouth 3 (three) times daily., Disp: , Rfl:  .  GNP ASPIRIN LOW DOSE 81 MG EC tablet, TAKE 1 TABLET BY MOUTH ONCE EVERY DAY FOR CIRCULATION, Disp: 30 tablet, Rfl: 5 .  ibuprofen (ADVIL,MOTRIN) 800 MG tablet, Take 800 mg by mouth every 6 (six) hours as needed.,  Disp: , Rfl:  .  ketoconazole (NIZORAL) 2 % cream, Apply topically daily., Disp: 60 g, Rfl: 11 .  ketoconazole (NIZORAL) 2 % shampoo, SHAMPOO HAIR 3 TIMES WEEKLY FOR FUNGAL INFECTION, Disp: 200 mL, Rfl: 5 .  LORazepam (ATIVAN) 0.5 MG tablet, Take 1 mg by mouth. 1 tablet PO for agitation greater than 10 mint, may repeat in 30 mins., Disp: , Rfl:  .  mometasone (ELOCON) 0.1 % lotion, Apply topically daily., Disp: , Rfl:  .  Nutritional Supplements (PROMOD) LIQD, MIX 1 OZ (30 ML) WITH 1 OZ WATER, DRINK TWICE DAILY FOR PROTEIN SUPPLEMENT, Disp: 1.892 mL, Rfl: 12 .  orphenadrine (NORFLEX) 100 MG tablet, Take 1 tablet (  100 mg total) by mouth 2 (two) times daily., Disp: 30 tablet, Rfl: 5 .  QUEtiapine (SEROQUEL) 200 MG tablet, Take 200 mg by mouth daily. 4 pm (Dr. Ave Filterhandler, Psychiatry), Disp: , Rfl:  .  REFRESH LIQUIGEL 1 % GEL, INSTILL 1 DROP INTO EACH EYE 3 TIMES A DAY, Disp: 1 each, Rfl: 2 .  tamsulosin (FLOMAX) 0.4 MG CAPS capsule, TAKE 1 CAPSULE BY MOUTH DAILY, Disp: 30 capsule, Rfl: 5 .  tiZANidine (ZANAFLEX) 2 MG tablet, Take 1 tablet (2 mg total) by mouth 3 (three) times daily., Disp: 90 tablet, Rfl: 5 .  traZODone (DESYREL) 50 MG tablet, TAKE 1 TABLET BY MOUTH EACH DAY AT BEDTIME FOR SLEEP, Disp: 30 tablet, Rfl: 5  No Known Allergies  I personally reviewed active problem list, medication list, allergies, family history, social history with the patient/caregiver today.   ROS  Constitutional: Negative for fever, positive for weight change.  Respiratory: Negative for cough and shortness of breath.   Cardiovascular: Negative for chest pain or palpitations.  Gastrointestinal: Negative for abdominal pain, no bowel changes.  Musculoskeletal: Negative for gait problem or joint swelling.  Skin: positive for chronic facial rash.  Neurological: Negative for dizziness or headache.  No other specific complaints in a complete review of systems (except as listed in HPI above).   Objective  Vitals:    03/23/19 1108  BP: 102/62  Pulse: 70  Resp: 16  Temp: 98.4 F (36.9 C)  TempSrc: Oral  SpO2: 98%  Weight: 114 lb 6.4 oz (51.9 kg)  Height: 5\' 3"  (1.6 m)    Body mass index is 20.27 kg/m.  Physical Exam  Constitutional: Patient appears malnourished with temporal waisting . No distress.  HEENT: head atraumatic, normocephalic, pupils equal and reactive to light, neck supple Cardiovascular: Normal rate, regular rhythm and normal heart sounds.  No murmur heard. No BLE edema. Pulmonary/Chest: Effort normal and breath sounds normal. No respiratory distress. Abdominal: Soft.  There is no tenderness. Muscular skeletal: uses a walker , sitting in no distress, but complained of wrist pain on right, wears a brace of left  Psychiatric: Patient has a normal mood and affect. behavior is normal. Judgment and thought content normal.  PHQ2/9: [facility] 2/9 11/21/2018 02/25/2017 02/22/2016 06/24/2015  Decreased Interest 0 0 0 0  Down, Depressed, Hopeless 0 0 0 0  PHQ - 2 Score 0 0 0 0     Unable to answer phq 9   Fall Risk: Fall Risk  03/23/2019 04/03/2018 03/18/2018 03/18/2018 11/18/2017  Falls in the past year? 1 Yes Yes No No  Number falls in past yr: 1 2 or more 2 or more - -  Injury with Fall? 0 No Yes - -  Risk Factor Category  - High Fall Risk High Fall Risk - -  Risk for fall due to : Impaired balance/gait Impaired vision;History of fall(s);Impaired balance/gait;Medication side effect;Impaired mobility History of fall(s);Impaired balance/gait;Impaired mobility Impaired balance/gait -  Risk for fall due to: Comment - wears eyeglasses - - -  Follow up - Falls evaluation completed;Education provided;Falls prevention discussed Falls prevention discussed - -    Functional Status Survey: Is the patient deaf or have difficulty hearing?: Yes Does the patient have difficulty seeing, even when wearing glasses/contacts?: Yes Does the patient have difficulty concentrating, remembering,  or making decisions?: Yes Does the patient have difficulty walking or climbing stairs?: Yes Does the patient have difficulty dressing or bathing?: Yes Does the patient have difficulty doing errands alone  such as visiting a doctor's office or shopping?: Yes    Assessment & Plan  1. Gait instability  Stable, uses a walker   2. Chronic pain syndrome   3. Mild protein-calorie malnutrition (HCC)  - protein supplement shake (PREMIER PROTEIN) LIQD; Take 325 mLs (11 oz total) by mouth 3 (three) times daily between meals.  Dispense: 90 Can; Refill: 5 - VITAMIN D 25 Hydroxy (Vit-D Deficiency, Fractures) - COMPLETE METABOLIC PANEL WITH GFR - CBC with Differential/Platelet  Recheck labs and increase protein supplements to TID   4. Vitamin D deficiency  - VITAMIN D 25 Hydroxy (Vit-D Deficiency, Fractures)  5. Anemia of chronic disease  - CBC with Differential/Platelet  6. OSA (obstructive sleep apnea)   7. Chronic bilateral low back pain without sciatica   8. Lives in group home   9. GERD without esophagitis  Seems to be under control, good appetite   10. Pure hypercholesterolemia  - Lipid panel  11. Chronic constipation   12. Weight loss  - TSH

## 2019-03-23 NOTE — Patient Instructions (Signed)

## 2019-03-24 LAB — CBC WITH DIFFERENTIAL/PLATELET
Absolute Monocytes: 690 cells/uL (ref 200–950)
Basophils Absolute: 68 cells/uL (ref 0–200)
Basophils Relative: 1.2 %
Eosinophils Absolute: 399 {cells}/uL (ref 15–500)
Eosinophils Relative: 7 %
HCT: 35.3 % — ABNORMAL LOW (ref 38.5–50.0)
Hemoglobin: 12.1 g/dL — ABNORMAL LOW (ref 13.2–17.1)
Lymphs Abs: 1767 cells/uL (ref 850–3900)
MCH: 32.7 pg (ref 27.0–33.0)
MCHC: 34.3 g/dL (ref 32.0–36.0)
MCV: 95.4 fL (ref 80.0–100.0)
MPV: 9.1 fL (ref 7.5–12.5)
Monocytes Relative: 12.1 %
Neutro Abs: 2776 cells/uL (ref 1500–7800)
Neutrophils Relative %: 48.7 %
Platelets: 324 10*3/uL (ref 140–400)
RBC: 3.7 10*6/uL — ABNORMAL LOW (ref 4.20–5.80)
RDW: 12 % (ref 11.0–15.0)
Total Lymphocyte: 31 %
WBC: 5.7 10*3/uL (ref 3.8–10.8)

## 2019-03-24 LAB — COMPLETE METABOLIC PANEL WITH GFR
AG Ratio: 1.7 (calc) (ref 1.0–2.5)
ALT: 11 U/L (ref 9–46)
AST: 14 U/L (ref 10–35)
Albumin: 4 g/dL (ref 3.6–5.1)
Alkaline phosphatase (APISO): 55 U/L (ref 35–144)
BUN: 21 mg/dL (ref 7–25)
CO2: 34 mmol/L — ABNORMAL HIGH (ref 20–32)
Calcium: 9.3 mg/dL (ref 8.6–10.3)
Chloride: 101 mmol/L (ref 98–110)
Creat: 0.94 mg/dL (ref 0.70–1.18)
GFR, Est African American: 94 mL/min/{1.73_m2} (ref >=60)
GFR, Est Non African American: 81 mL/min/{1.73_m2} (ref >=60)
Globulin: 2.4 g/dL (calc) (ref 1.9–3.7)
Glucose, Bld: 88 mg/dL (ref 65–99)
Potassium: 4.3 mmol/L (ref 3.5–5.3)
Sodium: 139 mmol/L (ref 135–146)
Total Bilirubin: 0.5 mg/dL (ref 0.2–1.2)
Total Protein: 6.4 g/dL (ref 6.1–8.1)

## 2019-03-24 LAB — LIPID PANEL
Cholesterol: 161 mg/dL (ref <200)
HDL: 62 mg/dL (ref >=40)
LDL Cholesterol (Calc): 83 mg/dL (calc)
Non-HDL Cholesterol (Calc): 99 mg/dL (calc) (ref <130)
Total CHOL/HDL Ratio: 2.6 (calc) (ref <5.0)
Triglycerides: 84 mg/dL (ref <150)

## 2019-03-24 LAB — VITAMIN D 25 HYDROXY (VIT D DEFICIENCY, FRACTURES): Vit D, 25-Hydroxy: 44 ng/mL (ref 30–100)

## 2019-03-24 LAB — TSH: TSH: 1.3 mIU/L (ref 0.40–4.50)

## 2019-04-09 ENCOUNTER — Ambulatory Visit (INDEPENDENT_AMBULATORY_CARE_PROVIDER_SITE_OTHER): Payer: Medicare Other

## 2019-04-09 ENCOUNTER — Other Ambulatory Visit: Payer: Self-pay

## 2019-04-09 ENCOUNTER — Other Ambulatory Visit: Payer: Self-pay | Admitting: Family Medicine

## 2019-04-09 VITALS — BP 112/62 | HR 77 | Temp 97.4°F | Resp 16 | Ht 63.0 in | Wt 114.6 lb

## 2019-04-09 DIAGNOSIS — Z0000 Encounter for general adult medical examination without abnormal findings: Secondary | ICD-10-CM

## 2019-04-09 MED ORDER — BOOST HIGH PROTEIN PO LIQD: 1.0000 | Freq: Three times a day (TID) | ORAL | 5 refills | Status: AC

## 2019-04-09 NOTE — Patient Instructions (Addendum)
[name] , Thank you for taking time to come for your Medicare Wellness Visit. I appreciate your ongoing commitment to your health goals. Please review the following plan we discussed and let me know if I can assist you in the future.   Screening recommendations/referrals: Colonoscopy: done 08/17/13. Repeat in 2024. Recommended yearly ophthalmology/optometry visit for glaucoma screening and checkup Recommended yearly dental visit for hygiene and checkup Recommended yearly evaluation of hearing and hearing aid maintenance with ENT provider.   Vaccinations: Influenza vaccine: done 07/21/18 Pneumococcal vaccine: done 08/04/15 Tdap vaccine: done 03/27/10 Shingles vaccine: Shingrix discussed. Please contact your pharmacy for coverage information.   Advanced directives: Advance directive discussed with you today. Even though you declined this today please call our office should you change your mind and we can give you the proper paperwork for you to fill out. Please notify legal guardian of need for healthcare advanced directives.   Conditions/risks identified: Recommend drinking 6-8 glasses of water per day.  Next appointment: Please follow up in one year for your Medicare Annual Wellness visit.    Preventive Care 77 Years and Older, Male Preventive care refers to lifestyle choices and visits with your health care provider that can promote health and wellness. What does preventive care include?  A yearly physical exam. This is also called an annual well check.  Dental exams once or twice a year.  Routine eye exams. Ask your health care provider how often you should have your eyes checked.  Personal lifestyle choices, including:  Daily care of your teeth and gums.  Regular physical activity.  Eating a healthy diet.  Avoiding tobacco and drug use.  Limiting alcohol use.  Practicing safe sex.  Taking low doses of aspirin every day.  Taking vitamin and mineral supplements as  recommended by your health care provider. What happens during an annual well check? The services and screenings done by your health care provider during your annual well check will depend on your age, overall health, lifestyle risk factors, and family history of disease. Counseling  Your health care provider may ask you questions about your:  Alcohol use.  Tobacco use.  Drug use.  Emotional well-being.  Home and relationship well-being.  Sexual activity.  Eating habits.  History of falls.  Memory and ability to understand (cognition).  Work and work Astronomer. Screening  You may have the following tests or measurements:  Height, weight, and BMI.  Blood pressure.  Lipid and cholesterol levels. These may be checked every 5 years, or more frequently if you are over 3 years old.  Skin check.  Lung cancer screening. You may have this screening every year starting at age 62 if you have a 30-pack-year history of smoking and currently smoke or have quit within the past 15 years.  Fecal occult blood test (FOBT) of the stool. You may have this test every year starting at age 37.  Flexible sigmoidoscopy or colonoscopy. You may have a sigmoidoscopy every 5 years or a colonoscopy every 10 years starting at age 57.  Prostate cancer screening. Recommendations will vary depending on your family history and other risks.  Hepatitis C blood test.  Hepatitis B blood test.  Sexually transmitted disease (STD) testing.  Diabetes screening. This is done by checking your blood sugar (glucose) after you have not eaten for a while (fasting). You may have this done every 1-3 years.  Abdominal aortic aneurysm (AAA) screening. You may need this if you are a current or former smoker.  Osteoporosis. You may be screened starting at age 72 if you are at high risk. Talk with your health care provider about your test results, treatment options, and if necessary, the need for more tests.  Vaccines  Your health care provider may recommend certain vaccines, such as:  Influenza vaccine. This is recommended every year.  Tetanus, diphtheria, and acellular pertussis (Tdap, Td) vaccine. You may need a Td booster every 10 years.  Zoster vaccine. You may need this after age 72.  Pneumococcal 13-valent conjugate (PCV13) vaccine. One dose is recommended after age 72.  Pneumococcal polysaccharide (PPSV23) vaccine. One dose is recommended after age 72. Talk to your health care provider about which screenings and vaccines you need and how often you need them. This information is not intended to replace advice given to you by your health care provider. Make sure you discuss any questions you have with your health care provider. Document Released: 11/18/2015 Document Revised: 07/11/2016 Document Reviewed: 08/23/2015 Elsevier Interactive Patient Education  2017 [employer]  Fall Prevention in the Home Falls can cause injuries. They can happen to people of all ages. There are many things you can do to make your home safe and to help prevent falls. What can I do on the outside of my home?  Regularly fix the edges of walkways and driveways and fix any cracks.  Remove anything that might make you trip as you walk through a door, such as a raised step or threshold.  Trim any bushes or trees on the path to your home.  Use bright outdoor lighting.  Clear any walking paths of anything that might make someone trip, such as rocks or tools.  Regularly check to see if handrails are loose or broken. Make sure that both sides of any steps have handrails.  Any raised decks and porches should have guardrails on the edges.  Have any leaves, snow, or ice cleared regularly.  Use sand or salt on walking paths during winter.  Clean up any spills in your garage right away. This includes oil or grease spills. What can I do in the bathroom?  Use night lights.  Install grab bars by the toilet  and in the tub and shower. Do not use towel bars as grab bars.  Use non-skid mats or decals in the tub or shower.  If you need to sit down in the shower, use a plastic, non-slip stool.  Keep the floor dry. Clean up any water that spills on the floor as soon as it happens.  Remove soap buildup in the tub or shower regularly.  Attach bath mats securely with double-sided non-slip rug tape.  Do not have throw rugs and other things on the floor that can make you trip. What can I do in the bedroom?  Use night lights.  Make sure that you have a light by your bed that is easy to reach.  Do not use any sheets or blankets that are too big for your bed. They should not hang down onto the floor.  Have a firm chair that has side arms. You can use this for support while you get dressed.  Do not have throw rugs and other things on the floor that can make you trip. What can I do in the kitchen?  Clean up any spills right away.  Avoid walking on wet floors.  Keep items that you use a lot in easy-to-reach places.  If you need to reach something above you, use a strong step  stool that has a grab bar.  Keep electrical cords out of the way.  Do not use floor polish or wax that makes floors slippery. If you must use wax, use non-skid floor wax.  Do not have throw rugs and other things on the floor that can make you trip. What can I do with my stairs?  Do not leave any items on the stairs.  Make sure that there are handrails on both sides of the stairs and use them. Fix handrails that are broken or loose. Make sure that handrails are as long as the stairways.  Check any carpeting to make sure that it is firmly attached to the stairs. Fix any carpet that is loose or worn.  Avoid having throw rugs at the top or bottom of the stairs. If you do have throw rugs, attach them to the floor with carpet tape.  Make sure that you have a light switch at the top of the stairs and the bottom of the  stairs. If you do not have them, ask someone to add them for you. What else can I do to help prevent falls?  Wear shoes that:  Do not have high heels.  Have rubber bottoms.  Are comfortable and fit you well.  Are closed at the toe. Do not wear sandals.  If you use a stepladder:  Make sure that it is fully opened. Do not climb a closed stepladder.  Make sure that both sides of the stepladder are locked into place.  Ask someone to hold it for you, if possible.  Clearly mark and make sure that you can see:  Any grab bars or handrails.  First and last steps.  Where the edge of each step is.  Use tools that help you move around (mobility aids) if they are needed. These include:  Canes.  Walkers.  Scooters.  Crutches.  Turn on the lights when you go into a dark area. Replace any light bulbs as soon as they burn out.  Set up your furniture so you have a clear path. Avoid moving your furniture around.  If any of your floors are uneven, fix them.  If there are any pets around you, be aware of where they are.  Review your medicines with your doctor. Some medicines can make you feel dizzy. This can increase your chance of falling. Ask your doctor what other things that you can do to help prevent falls. This information is not intended to replace advice given to you by your health care provider. Make sure you discuss any questions you have with your health care provider. Document Released: 08/18/2009 Document Revised: 03/29/2016 Document Reviewed: 11/26/2014 Elsevier Interactive Patient Education  2017 [employer].

## 2019-04-09 NOTE — Progress Notes (Addendum)
Subjective:   Aaron Foster is a 73 y.o. male who presents for Medicare Annual/Subsequent preventive examination.  Review of Systems:   Cardiac Risk Factors include: advanced age (>4men, >77 women);male gender     Objective:    Vitals: BP 112/62 (BP Location: Right Arm, Patient Position: Sitting, Cuff Size: Normal)   Pulse 77   Temp (!) 97.4 F (36.3 C) (Oral)   Resp 16   Ht 5\' 3"  (1.6 m)   Wt 114 lb 9.6 oz (52 kg)   BMI 20.30 kg/m   Body mass index is 20.3 kg/m.  Advanced Directives 04/09/2019 04/03/2018 07/26/2017 05/10/2017 02/25/2017 10/26/2016 09/07/2016  Does Patient Have a Medical Advance Directive? No Yes No No No Yes Yes  Type of Advance Directive - Healthcare Power of [place];Living will - - - Psychologist, occupational  Does patient want to make changes to medical advance directive? - - - - - - No - Patient declined  Copy of Healthcare Power of Attorney in Chart? - No - copy requested - - - No - copy requested No - copy requested  Would patient like information on creating a medical advance directive? No - Guardian declined - - - - - -    Tobacco Social History   Tobacco Use  Smoking Status Former Smoker  . Types: Pipe  Smokeless Tobacco Never Used  Tobacco Comment   unknown smoking history; smoking cessation materials not required     Counseling given: Not Answered Comment: unknown smoking history; smoking cessation materials not required   Clinical Intake:  Pre-visit preparation completed: Yes  Pain : No/denies pain     BMI - recorded: 20.3 Nutritional Status: BMI of 19-24  Normal Nutritional Risks: None Diabetes: No  How often do you need to have someone help you when you read instructions, pamphlets, or other written materials from your doctor or pharmacy?: 5 - Always  Interpreter Needed?: No  Information entered by :: Reather Littler LPN  Past Medical History:  Diagnosis Date  . Anemia   . Bilateral renal  cysts   . BPH (benign prostatic hyperplasia)   . Cannot hear   . Chronic back pain   . Constipation   . Esophageal reflux   . Hemorrhoid   . HLD (hyperlipidemia)   . Insomnia   . Nephrolithiasis   . Osteoporosis   . Phimosis   . Seborrheic dermatitis    Past Surgical History:  Procedure Laterality Date  . arm fracture    . circumscision     Family History  Problem Relation Age of Onset  . Kidney disease Neg Hx    Social History   Socioeconomic History  . Marital status: Single    Spouse name: Not on file  . Number of children: 0  . Years of education: unknown  . Highest education level: Not on file  Occupational History  . Occupation: Retired  Engineer, production  . Financial resource strain: Not hard at all  . Food insecurity:    Worry: Never true    Inability: Never true  . Transportation needs:    Medical: No    Non-medical: No  Tobacco Use  . Smoking status: Former Smoker    Types: Pipe  . Smokeless tobacco: Never Used  . Tobacco comment: unknown smoking history; smoking cessation materials not required  Substance and Sexual Activity  . Alcohol use: No    Alcohol/week: 0.0 standard drinks  . Drug use: No  .  Sexual activity: Not Currently  Lifestyle  . Physical activity:    Days per week: 0 days    Minutes per session: 0 min  . Stress: Not at all  Relationships  . Social connections:    Talks on phone: Patient refused    Gets together: Patient refused    Attends religious service: Patient refused    Active member of club or organization: Patient refused    Attends meetings of clubs or organizations: Patient refused    Relationship status: Patient refused  Other Topics Concern  . Not on file  Social History Narrative  . Not on file    Outpatient Encounter Medications as of 04/09/2019  Medication Sig  . acetaminophen (ACETAMINOPHEN EXTRA STRENGTH) 500 MG tablet TAKE 2 TABLETS BY MOUTH 3 TIMES DAILY FOR PAIN  . AMITIZA 24 MCG capsule TAKE 1 CAPSULE BY  MOUTH 2 TIMES PER DAY WITH A MEAL  . Calcium Carbonate-Vitamin D3 600-400 MG-UNIT TABS Take by mouth.  . D3-1000 25 MCG (1000 UT) tablet TAKE 1 TABLET BY MOUTH ONCE DAILY FOR SUPPLEMENT.  . DULoxetine (CYMBALTA) 60 MG capsule Take two capsules by mouth every morning  . esomeprazole (NEXIUM) 20 MG capsule Take 1 capsule (20 mg total) by mouth daily.  Marland Kitchen gabapentin (NEURONTIN) 100 MG capsule Take 200 mg by mouth 3 (three) times daily.  . GNP ASPIRIN LOW DOSE 81 MG EC tablet TAKE 1 TABLET BY MOUTH ONCE EVERY DAY FOR CIRCULATION  . ibuprofen (ADVIL,MOTRIN) 800 MG tablet Take 800 mg by mouth every 6 (six) hours as needed.  Marland Kitchen ketoconazole (NIZORAL) 2 % cream Apply topically daily.  Marland Kitchen ketoconazole (NIZORAL) 2 % shampoo SHAMPOO HAIR 3 TIMES WEEKLY FOR FUNGAL INFECTION  . LORazepam (ATIVAN) 0.5 MG tablet Take 1 mg by mouth. 1 tablet PO for agitation greater than 10 mint, may repeat in 30 mins.  . mometasone (ELOCON) 0.1 % lotion Apply topically daily.  . Nutritional Supplements (PROMOD) LIQD MIX 1 OZ (30 ML) WITH 1 OZ WATER, DRINK TWICE DAILY FOR PROTEIN SUPPLEMENT  . orphenadrine (NORFLEX) 100 MG tablet Take 1 tablet (100 mg total) by mouth 2 (two) times daily.  . QUEtiapine (SEROQUEL XR) 200 MG 24 hr tablet   . REFRESH LIQUIGEL 1 % GEL INSTILL 1 DROP INTO EACH EYE 3 TIMES A DAY  . tamsulosin (FLOMAX) 0.4 MG CAPS capsule TAKE 1 CAPSULE BY MOUTH DAILY  . tiZANidine (ZANAFLEX) 2 MG tablet Take 1 tablet (2 mg total) by mouth 3 (three) times daily.  . traZODone (DESYREL) 50 MG tablet TAKE 1 TABLET BY MOUTH EACH DAY AT BEDTIME FOR SLEEP  . protein supplement shake (PREMIER PROTEIN) LIQD Take 325 mLs (11 oz total) by mouth 3 (three) times daily between meals. (Patient not taking: Reported on 04/09/2019)  . [DISCONTINUED] DULoxetine (CYMBALTA) 30 MG capsule Take 90 mg by mouth daily.  . [DISCONTINUED] QUEtiapine (SEROQUEL) 200 MG tablet Take 200 mg by mouth daily. 4 pm (Dr. Ave Filter, Psychiatry)   No  facility-administered encounter medications on file as of 04/09/2019.     Activities of Daily Living In your present state of health, do you have any difficulty performing the following activities: 04/09/2019 03/23/2019  Hearing? Malvin Johns  Comment has hearing aids but often refuses them -  Vision? N Y  Comment wears glasses -  Difficulty concentrating or making decisions? Malvin Johns  Walking or climbing stairs? Y Y  Dressing or bathing? Malvin Johns  Comment needs bathing assistance -  Doing  errands, shopping? Malvin Johns  Preparing Food and eating ? Y -  Using the Toilet? N -  In the past six months, have you accidently leaked urine? N -  Do you have problems with loss of bowel control? N -  Managing your Medications? Y -  Managing your Finances? Y -  Housekeeping or managing your Housekeeping? Y -  Some recent data might be hidden    Patient Care Team: Alba Cory, MD as PCP - General (Family Medicine) Bud Face, MD as Referring Physician (Otolaryngology) Antonietta Breach, MD as Referring Physician (Psychiatry) Domingo Madeira, OD (Optometry) Midge Minium, MD as Consulting Physician (Gastroenterology) Myra Rude, MD as Consulting Physician (Orthopedic Surgery) Ulanda Edison, MD as Consulting Physician (Urology)   Assessment:   This is a routine wellness examination for Colonel.  Exercise Activities and Dietary recommendations Current Exercise Habits: The patient does not participate in regular exercise at present, Exercise limited by: orthopedic condition(s);psychological condition(s)  Goals    . DIET - INCREASE WATER INTAKE     Recommend to drink at least 6-8 8oz glasses of water per day.       Fall Risk Fall Risk  04/09/2019 03/23/2019 04/03/2018 03/18/2018 03/18/2018  Falls in the past year? 1 1 Yes Yes No  Number falls in past yr: or more 2 or more -  Injury with Fall? 1 0 No Yes -  Risk Factor Category  - - High Fall Risk High Fall Risk -  Risk for fall due to : History  of fall(s);Impaired balance/gait;Impaired mobility Impaired balance/gait Impaired vision;History of fall(s);Impaired balance/gait;Medication side effect;Impaired mobility History of fall(s);Impaired balance/gait;Impaired mobility Impaired balance/gait  Risk for fall due to: Comment - - wears eyeglasses - -  Follow up - - Falls evaluation completed;Education provided;Falls prevention discussed Falls prevention discussed -   FALL RISK PREVENTION PERTAINING TO THE HOME:  Any stairs in or around the home? No  If so, do they handrails? No   Home free of loose throw rugs in walkways, pet beds, electrical cords, etc? Yes  Adequate lighting in your home to reduce risk of falls? Yes   ASSISTIVE DEVICES UTILIZED TO PREVENT FALLS:  Life alert? No  Use of a cane, walker or w/c? Yes  Grab bars in the bathroom? Yes  Shower chair or bench in shower? Yes  Elevated toilet seat or a handicapped toilet? Yes   DME ORDERS:  DME order needed?  No   TIMED UP AND GO:  Was the test performed? Yes .  Length of time to ambulate 10 feet: 12 sec.   GAIT:  Appearance of gait:  Gait slow, steady and with the use of an assistive device.   Education: Fall risk prevention has been discussed.  Intervention(s) required? No   Depression Screen PHQ 2/9 Scores 04/09/2019 03/23/2019 03/23/2019 11/21/2018  PHQ - 2 Score - - - 0  Exception Documentation Medical reason Other- indicate reason in comment box Medical reason -  Not completed (No Data) sees psychiatrist, unable to answer question  - -    Cognitive Function        Immunization History  Administered Date(s) Administered  . Hepatitis B, adult 04/03/2018, 07/21/2018, 09/30/2018  . Influenza, High Dose Seasonal PF 09/07/2016, 06/28/2017, 07/21/2018  . Influenza,inj,Quad PF,6+ Mos 06/24/2015  . Influenza-Unspecified 10/18/2014  . Pneumococcal Conjugate-13 08/04/2015  . Pneumococcal Polysaccharide-23 03/27/2010  . Tdap 03/27/2010  . Zoster 11/19/2012     Qualifies for Shingles Vaccine?  Yes  Zostavax completed 2014. Due for Shingrix. Education has been provided regarding the importance of this vaccine. Pt has been advised to call insurance company to determine out of pocket expense. Advised may also receive vaccine at local pharmacy or Health Dept. Verbalized acceptance and understanding.  Tdap: Up to date  Flu Vaccine: Up to date  Pneumococcal Vaccine: Up to date   Screening Tests Health Maintenance  Topic Date Due  . INFLUENZA VACCINE  06/06/2019  . TETANUS/TDAP  03/27/2020  . COLONOSCOPY  08/18/2023  . Hepatitis C Screening  Completed  . PNA vac Low Risk Adult  Completed   Cancer Screenings:  Colorectal Screening: Completed 08/17/13. Repeat every 10 years.  Lung Cancer Screening: (Low Dose CT Chest recommended if Age 5-80 years, 30 pack-year currently smoking OR have quit w/in 15years.) does not qualify.   Additional Screening:  Hepatitis C Screening: does qualify; Completed 06/05/13  Vision Screening: Recommended annual ophthalmology exams for early detection of glaucoma and other disorders of the eye. Is the patient up to date with their annual eye exam?  Yes  Who is the provider or what is the name of the office in which the pt attends annual eye exams? Dr. Larence PenningNice  Dental Screening: Recommended annual dental exams for proper oral hygiene  Community Resource Referral:  CRR required this visit?  No       Plan:    I have personally reviewed and addressed the Medicare Annual Wellness questionnaire and have noted the following in the patient's chart:  A. Medical and social history B. Use of alcohol, tobacco or illicit drugs  C. Current medications and supplements D. Functional ability and status E.  Nutritional status F.  Physical activity G. Advance directives H. List of other physicians I.  Hospitalizations, surgeries, and ER visits in previous 12 months J.  Vitals K. Screenings such as hearing and vision if  needed, cognitive and depression L. Referrals and appointments   In addition, I have reviewed and discussed with patient certain preventive protocols, quality metrics, and best practice recommendations. A written personalized care plan for preventive services as well as general preventive health recommendations were provided to patient.   Signed,  Reather LittlerKasey Loletta Harper, LPN Nurse Health Advisor   Nurse Notes: patient [facility] today [facility]. She is requesting prescription for premier protein shakes be changed to a basic Ensure or Boost due to what is available at the pharmacy because they do not carry the Premier. Thank you!

## 2019-04-21 ENCOUNTER — Telehealth: Payer: Self-pay | Admitting: Family Medicine

## 2019-04-21 NOTE — Telephone Encounter (Signed)
**  [facility] Liming (on [place]) calling and state that Dr Ancil Boozer had written a prescription for the Premier supplement shakes for the patient to take, but those have been upsetting his stomach. Would like to know if the patient could get a prescription for Ensure Supplemental Shakes? States he has been on those shakes before and they did not bother him. Please advise.**  Medication Refill - Medication: Premier Supplemental Shake  Has the patient contacted their pharmacy? Yes.   (Agent: If no, request that the patient contact the pharmacy for the refill.) (Agent: If yes, when and what did the pharmacy advise?)  Preferred Pharmacy (with phone number or street name): [facility], [place]  Agent: Please be advised that RX refills may take up to 3 business days. We ask that you follow-up with your pharmacy.

## 2019-04-22 NOTE — Telephone Encounter (Signed)
Voicemail was not set up for Aaron Foster, if she calls back we need to ask her about [name].

## 2019-04-23 DIAGNOSIS — F419 Anxiety disorder, unspecified: Secondary | ICD-10-CM | POA: Diagnosis not present

## 2019-04-28 ENCOUNTER — Other Ambulatory Visit: Payer: Self-pay | Admitting: Family Medicine

## 2019-04-28 DIAGNOSIS — K5909 Other constipation: Secondary | ICD-10-CM

## 2019-04-28 DIAGNOSIS — K219 Gastro-esophageal reflux disease without esophagitis: Secondary | ICD-10-CM

## 2019-05-28 ENCOUNTER — Other Ambulatory Visit: Payer: Self-pay | Admitting: Family Medicine

## 2019-05-28 DIAGNOSIS — M62838 Other muscle spasm: Secondary | ICD-10-CM

## 2019-06-01 DIAGNOSIS — H5203 Hypermetropia, bilateral: Secondary | ICD-10-CM | POA: Diagnosis not present

## 2019-06-01 DIAGNOSIS — H2513 Age-related nuclear cataract, bilateral: Secondary | ICD-10-CM | POA: Diagnosis not present

## 2019-06-01 DIAGNOSIS — H52223 Regular astigmatism, bilateral: Secondary | ICD-10-CM | POA: Diagnosis not present

## 2019-06-01 DIAGNOSIS — H524 Presbyopia: Secondary | ICD-10-CM | POA: Diagnosis not present

## 2019-06-01 DIAGNOSIS — H04123 Dry eye syndrome of bilateral lacrimal glands: Secondary | ICD-10-CM | POA: Diagnosis not present

## 2019-06-10 ENCOUNTER — Other Ambulatory Visit: Payer: Self-pay | Admitting: Family Medicine

## 2019-06-10 ENCOUNTER — Telehealth: Payer: Self-pay | Admitting: Family Medicine

## 2019-06-10 MED ORDER — METAXALONE 800 MG PO TABS: 800.0000 mg | ORAL_TABLET | Freq: Three times a day (TID) | ORAL | 3 refills | Status: DC

## 2019-06-10 NOTE — Telephone Encounter (Signed)
[facility] calling to advise the  orphenadrine (NORFLEX) 100 MG tablet  Is on backorder.  They have nothing to give him in the morning as of Friday am.. Pt has his night med of this for 23 more days, but nothing for the mornings.  They need to know what to do about this issue. He had a few choices to give.  Marcello Moores, the pharmacist needs a call back asap to advise on what to do now.  [phone]

## 2019-06-24 ENCOUNTER — Other Ambulatory Visit: Payer: Self-pay

## 2019-06-24 ENCOUNTER — Ambulatory Visit (INDEPENDENT_AMBULATORY_CARE_PROVIDER_SITE_OTHER): Payer: Medicare Other | Admitting: Family Medicine

## 2019-06-24 ENCOUNTER — Encounter: Payer: Self-pay | Admitting: Family Medicine

## 2019-06-24 VITALS — BP 114/62 | HR 83 | Temp 97.1°F | Resp 16 | Ht 63.0 in | Wt 115.6 lb

## 2019-06-24 DIAGNOSIS — G894 Chronic pain syndrome: Secondary | ICD-10-CM | POA: Diagnosis not present

## 2019-06-24 DIAGNOSIS — Z789 Other specified health status: Secondary | ICD-10-CM

## 2019-06-24 DIAGNOSIS — M62838 Other muscle spasm: Secondary | ICD-10-CM

## 2019-06-24 DIAGNOSIS — E7800 Pure hypercholesterolemia, unspecified: Secondary | ICD-10-CM | POA: Diagnosis not present

## 2019-06-24 DIAGNOSIS — G8929 Other chronic pain: Secondary | ICD-10-CM

## 2019-06-24 DIAGNOSIS — D638 Anemia in other chronic diseases classified elsewhere: Secondary | ICD-10-CM | POA: Diagnosis not present

## 2019-06-24 DIAGNOSIS — K5909 Other constipation: Secondary | ICD-10-CM

## 2019-06-24 DIAGNOSIS — E441 Mild protein-calorie malnutrition: Secondary | ICD-10-CM | POA: Diagnosis not present

## 2019-06-24 DIAGNOSIS — G4733 Obstructive sleep apnea (adult) (pediatric): Secondary | ICD-10-CM

## 2019-06-24 DIAGNOSIS — R2681 Unsteadiness on feet: Secondary | ICD-10-CM

## 2019-06-24 DIAGNOSIS — M545 Low back pain, unspecified: Secondary | ICD-10-CM

## 2019-06-24 DIAGNOSIS — E559 Vitamin D deficiency, unspecified: Secondary | ICD-10-CM | POA: Diagnosis not present

## 2019-06-24 DIAGNOSIS — Z593 Problems related to living in residential institution: Secondary | ICD-10-CM | POA: Diagnosis not present

## 2019-06-24 NOTE — Progress Notes (Signed)
Name: Aaron Foster   MRN: [id number]    DOB: 1946/06/16   Date:06/24/2019       Progress Note  Subjective  Chief Complaint  Chief Complaint  Patient presents with  . Weight Check    Doing will with Boost Shakes and his home has increased his food letting him having seconds    HPI  He lives at a group home / [facility] and came in today with caregiver: Marigene EhlersBarbara Harris   Hearing loss: sees Dr. Andee PolesVaught, hedoes not like wearing it.Unchanged, not wearing it today , caregiver is able to communicate with him by using sign language.   Protein Calorie malnutrition: he is pick eaterhe is drinking Premier shake three times a day between meals. Weight has been stable over the past few visits, went as low as 112 lbs but today is 115 lbs, he likes to pace / uses his walker to move around. [name] back on CBD oil given by Dr. Ave Filterhandler twice daily. His weight back in 2016 was 138 lbs  Chronic pain: has a brace on left wrist andusually on hisright knee - but recently no complaints from his knee latelytHe has chronic back pain and istaking Norflex, unfortunately insurance is not covering lidoderm patches,seems to be stable.  Britta MccreedyBarbara states random, not sure if CBD helped. [name] off Tizanidine and is tolerating skelaxin now   Depression:still under the care of Dr. Ave Filterhandler, taking medications as prescribed and also on CBD oils and mood has been good lately at home, sometimes loses his patience and occasionally gets agitated, since COVID-19 is harder since not going to the day program but adjusting   Gait instability: using a walker, heis having PTand he is cooperative most of the time.No changes   Anemia:of chronic disease, last labs stable, no SOB.Reviewed   Chronic constipation: taking medication and has regular bowel movements, every other day, no straining, blood in stoolsor pain.Taking prune juicedaily and prunes on Sundays  OSA: found results on Dr. Margaretmary EddyShah's  visit, heis on CPAP every night now and Britta MccreedyBarbara states he is compliant. Unchanged    Patient Active Problem List   Diagnosis Date Noted  . OSA on CPAP 06/23/2018  . OSA (obstructive sleep apnea) 03/18/2018  . Vitamin D deficiency 04/08/2017  . Scoliosis 02/22/2016  . Recurrent falls 08/04/2015  . Dermatitis seborrheica 06/24/2015  . Chronic constipation 06/24/2015  . Lives in group home 06/24/2015  . Protein calorie malnutrition (HCC) 06/24/2015  . Hearing loss of both ears 06/24/2015  . Wears hearing aid 06/24/2015  . History of iron deficiency anemia 06/24/2015  . Osteoporosis 06/24/2015  . Adult behavior problem 06/24/2015  . Hiatal hernia 06/24/2015  . Hemorrhoid 06/24/2015  . Gait instability 06/24/2015  . DDD (degenerative disc disease), lumbar 06/24/2015  . Nephrolithiasis 06/24/2015  . Hyperlipidemia 06/24/2015  . Compression fracture of L1 lumbar vertebra (HCC) 06/24/2015  . Family history of upper GI bleeding   . BPH with obstruction/lower urinary tract symptoms 06/21/2015  . Chronic pain 06/19/2015  . Acid reflux 06/19/2015  . Intellectual disability 06/19/2015    Past Surgical History:  Procedure Laterality Date  . arm fracture    . circumscision      Family History  Problem Relation Age of Onset  . Kidney disease Neg Hx     Social History   Socioeconomic History  . Marital status: Single    Spouse name: Not on file  . Number of children: 0  . Years  of education: unknown  . Highest education level: Not on file  Occupational History  . Occupation: Retired  Engineer, productionocial Needs  . Financial resource strain: Not hard at all  . Food insecurity    Worry: Never true    Inability: Never true  . Transportation needs    Medical: No    Non-medical: No  Tobacco Use  . Smoking status: Former Smoker    Types: Pipe  . Smokeless tobacco: Never Used  . Tobacco comment: unknown smoking history; smoking cessation materials not required  Substance and Sexual  Activity  . Alcohol use: No    Alcohol/week: 0.0 standard drinks  . Drug use: No  . Sexual activity: Not Currently  Lifestyle  . Physical activity    Days per week: 0 days    Minutes per session: 0 min  . Stress: Not at all  Relationships  . Social Musicianconnections    Talks on phone: Patient refused    Gets together: Patient refused    Attends religious service: Patient refused    Active member of club or organization: Patient refused    Attends meetings of clubs or organizations: Patient refused    Relationship status: Patient refused  . Intimate partner violence    Fear of current or ex partner: No    Emotionally abused: No    Physically abused: No    Forced sexual activity: No  Other Topics Concern  . Not on file  Social History Narrative  . Not on file     Current Outpatient Medications:  .  acetaminophen (ACETAMINOPHEN EXTRA STRENGTH) 500 MG tablet, TAKE 2 TABLETS BY MOUTH 3 TIMES DAILY FOR PAIN, Disp: 90 tablet, Rfl: 5 .  AMITIZA 24 MCG capsule, TAKE 1 CAPSULE BY MOUTH 2 TIMES PER DAY WITH MEAL, Disp: 60 capsule, Rfl: 5 .  ASPIRIN LOW DOSE 81 MG EC tablet, TAKE 1 TABLET BY MOUTH ONCE EVERY DAY FOR CIRCULATION, Disp: 30 tablet, Rfl: 5 .  Calcium Carbonate-Vitamin D3 600-400 MG-UNIT TABS, Take by mouth., Disp: , Rfl:  .  D3-1000 25 MCG (1000 UT) tablet, TAKE 1 TABLET BY MOUTH ONCE DAILY FOR SUPPLEMENT., Disp: 30 tablet, Rfl: 5 .  DULoxetine (CYMBALTA) 60 MG capsule, Take two capsules by mouth every morning, Disp: , Rfl:  .  esomeprazole (NEXIUM) 20 MG capsule, TAKE 1 CAPSULE BY MOUTH AT LEAST 30 MINUTES BEFORE FOOD, ONCE DAILY FOR REFLUX, Disp: 30 capsule, Rfl: 5 .  feeding supplement (BOOST HIGH PROTEIN) LIQD, Take 237 mLs by mouth 3 (three) times daily between meals., Disp: 90 Can, Rfl: 5 .  gabapentin (NEURONTIN) 100 MG capsule, Take 200 mg by mouth 3 (three) times daily., Disp: , Rfl:  .  ibuprofen (ADVIL,MOTRIN) 800 MG tablet, Take 800 mg by mouth every 6 (six) hours as  needed., Disp: , Rfl:  .  ketoconazole (NIZORAL) 2 % cream, Apply topically daily., Disp: 60 g, Rfl: 11 .  ketoconazole (NIZORAL) 2 % shampoo, SHAMPOO HAIR 3 TIMES WEEKLY FOR FUNGAL INFECTION, Disp: 200 mL, Rfl: 5 .  LORazepam (ATIVAN) 0.5 MG tablet, Take 1 mg by mouth. 1 tablet PO for agitation greater than 10 mint, may repeat in 30 mins., Disp: , Rfl:  .  metaxalone (SKELAXIN) 800 MG tablet, Take 1 tablet (800 mg total) by mouth 3 (three) times daily., Disp: 90 tablet, Rfl: 3 .  mometasone (ELOCON) 0.1 % lotion, Apply topically daily., Disp: , Rfl:  .  QUEtiapine (SEROQUEL XR) 200 MG 24 hr tablet, ,  Disp: , Rfl:  .  REFRESH LIQUIGEL 1 % GEL, INSTILL 1 DROP INTO EACH EYE 3 TIMES A DAY, Disp: 1 each, Rfl: 2 .  tamsulosin (FLOMAX) 0.4 MG CAPS capsule, TAKE 1 CAPSULE BY MOUTH DAILY, Disp: 30 capsule, Rfl: 5 .  traZODone (DESYREL) 50 MG tablet, TAKE 1 TABLET BY MOUTH EACH DAY AT BEDTIME FOR SLEEP, Disp: 30 tablet, Rfl: 5  No Known Allergies  I personally reviewed active problem list, medication list, allergies, family history, social history with the patient/caregiver today.   ROS  Constitutional: Negative for fever or weight change.  Respiratory: Negative for cough and shortness of breath.   Cardiovascular: Negative for chest pain or palpitations.  Gastrointestinal: Negative for abdominal pain, no bowel changes.  Musculoskeletal: Positive for gait problem no recent  joint swelling.  Skin:positive for chronic rash on face.  Neurological: Negative for dizziness or headache.  No other specific complaints in a complete review of systems (except as listed in HPI above).  Objective  Vitals:   06/24/19 1053  BP: 114/62  Pulse: 83  Resp: 16  Temp: (!) 97.1 F (36.2 C)  TempSrc: Temporal  SpO2: 96%  Weight: 115 lb 9.6 oz (52.4 kg)  Height: 5\' 3"  (1.6 m)    Body mass index is 20.48 kg/m.  Physical Exam  Constitutional: Patient appears well-developed and very thin with some temporal  waiting No distress.  HEENT: head atraumatic, normocephalic, pupils equal and reactive to light,  neck supple Cardiovascular: Normal rate, regular rhythm and normal heart sounds.  No murmur heard. No BLE edema. Pulmonary/Chest: Effort normal and breath sounds normal. No respiratory distress. Abdominal: Soft.  There is no tenderness. Psychiatric: Patient has a normal mood and affect. Non verbal    PHQ2/9: [facility] 11/21/2018 02/25/2017 02/22/2016 06/24/2015  Decreased Interest 0 0 0 0  Down, Depressed, Hopeless 0 0 0 0  PHQ - 2 Score 0 0 0 0    Unable to check, sees psychiatrist, non verbal and has MR  Fall Risk: Fall Risk  06/24/2019 04/09/2019 03/23/2019 04/03/2018 03/18/2018  Falls in the past year? 1 1 1  Yes Yes  Number falls in past yr: 1 1 1 2  or more 2 or more  Injury with Fall? 0 1 0 No Yes  Risk Factor Category  - - - High Fall Risk High Fall Risk  Risk for fall due to : Impaired balance/gait;Impaired mobility History of fall(s);Impaired balance/gait;Impaired mobility Impaired balance/gait Impaired vision;History of fall(s);Impaired balance/gait;Medication side effect;Impaired mobility History of fall(s);Impaired balance/gait;Impaired mobility  Risk for fall due to: Comment - - - wears eyeglasses -  Follow up - - - Falls evaluation completed;Education provided;Falls prevention discussed Falls prevention discussed     Functional Status Survey: Is the patient deaf or have difficulty hearing?: No Does the patient have difficulty seeing, even when wearing glasses/contacts?: Yes Does the patient have difficulty concentrating, remembering, or making decisions?: Yes Does the patient have difficulty walking or climbing stairs?: Yes Does the patient have difficulty dressing or bathing?: Yes Does the patient have difficulty doing errands alone such as visiting a doctor's office or shopping?: Yes    Assessment & Plan  1. Chronic pain syndrome  stable  2. Anemia of  chronic disease  Stable over the past few years   3. Vitamin D deficiency  Last level was at goal   4. Gait instability  Using a walker   5. Mild protein-calorie malnutrition (HCC)  On supplements   6.  OSA (obstructive sleep apnea)  Compliant   7. Chronic bilateral low back pain without sciatica  On medication and stable   8. Lives in group home  At [facility]  9. Pure hypercholesterolemia  Continue medication   10. Chronic constipation  Stable  11. Muscle spasm  On muscle relaxer

## 2019-06-26 ENCOUNTER — Other Ambulatory Visit: Payer: Self-pay | Admitting: Family Medicine

## 2019-06-29 ENCOUNTER — Other Ambulatory Visit: Payer: Self-pay | Admitting: Urology

## 2019-06-29 ENCOUNTER — Other Ambulatory Visit: Payer: Self-pay | Admitting: Family Medicine

## 2019-06-29 DIAGNOSIS — E559 Vitamin D deficiency, unspecified: Secondary | ICD-10-CM

## 2019-06-29 DIAGNOSIS — N401 Enlarged prostate with lower urinary tract symptoms: Secondary | ICD-10-CM

## 2019-07-23 ENCOUNTER — Other Ambulatory Visit: Payer: Self-pay | Admitting: Family Medicine

## 2019-07-23 MED ORDER — BACLOFEN 20 MG PO TABS: 20.0000 mg | ORAL_TABLET | Freq: Three times a day (TID) | ORAL | 2 refills | Status: DC

## 2019-07-29 ENCOUNTER — Other Ambulatory Visit: Payer: Medicare Other

## 2019-07-29 ENCOUNTER — Other Ambulatory Visit: Payer: Self-pay

## 2019-07-29 DIAGNOSIS — N138 Other obstructive and reflux uropathy: Secondary | ICD-10-CM

## 2019-07-29 DIAGNOSIS — N401 Enlarged prostate with lower urinary tract symptoms: Secondary | ICD-10-CM | POA: Diagnosis not present

## 2019-07-30 LAB — PSA: Prostate Specific Ag, Serum: 0.2 ng/mL (ref 0.0–4.0)

## 2019-07-31 ENCOUNTER — Telehealth: Payer: Self-pay

## 2019-07-31 NOTE — Telephone Encounter (Signed)
Copied from [place] [id number]. Topic: General - Other >> Jul 30, 2019  2:03 PM Ivar Drape wrote: Reason for CRM:  Sondra Barges wanted the provider to know that the wrong 90day order was dropped off a couple of days ago.  He wants you to disregard that order and he faxed a new one today.

## 2019-07-31 NOTE — Telephone Encounter (Signed)
Copied from CRM #[id number]. Topic: General - Other >> Jul 30, 2019  2:03 PM Walter, Linda F wrote: Reason for CRM:  Ken Theodore wanted the provider to know that the wrong 90day order was dropped off a couple of days ago.  He wants you to disregard that order and he faxed a new one today. 

## 2019-08-04 ENCOUNTER — Telehealth: Payer: Self-pay | Admitting: Family Medicine

## 2019-08-04 NOTE — Progress Notes (Signed)
08/05/2019 11:45 AM   Aaron Foster [birth year] [id number]  Referring provider: Steele Sizer, MD [address] [place],  [place] [address]  No chief complaint on file.   HPI: Aaron Foster a 73 year old male with BPH and LUTS who presents today for yearly follow up.  [name] agitated today and so could not get a good history.  His PVR is 246 mL.  Caregiver does not report Patient denies any gross hematuria, dysuria or suprapubic/flank pain.  Patient denies any fevers, chills, nausea or vomiting.    PMH: Past Medical History:  Diagnosis Date  . Anemia   . Bilateral renal cysts   . BPH (benign prostatic hyperplasia)   . Cannot hear   . Chronic back pain   . Constipation   . Esophageal reflux   . Hemorrhoid   . HLD (hyperlipidemia)   . Insomnia   . Nephrolithiasis   . Osteoporosis   . Phimosis   . Seborrheic dermatitis     Surgical History: Past Surgical History:  Procedure Laterality Date  . arm fracture    . circumscision      Home Medications:  Allergies as of 08/05/2019   No Known Allergies     Medication List       Accurate as of August 05, 2019 11:45 AM. If you have any questions, ask your nurse or doctor.        acetaminophen 500 MG tablet Commonly known as: Acetaminophen Extra Strength TAKE 2 TABLETS BY MOUTH 3 TIMES DAILY FOR PAIN   Amitiza 24 MCG capsule Generic drug: lubiprostone TAKE 1 CAPSULE BY MOUTH 2 TIMES PER DAY WITH MEAL   Aspirin Low Dose 81 MG EC tablet Generic drug: aspirin TAKE 1 TABLET BY MOUTH ONCE EVERY DAY FOR CIRCULATION   baclofen 20 MG tablet Commonly known as: LIORESAL Take 1 tablet (20 mg total) by mouth 3 (three) times daily.   Calcium Carbonate-Vitamin D3 600-400 MG-UNIT Tabs Take by mouth.   D3-1000 25 MCG (1000 UT) tablet Generic drug: Cholecalciferol TAKE 1 TABLET BY MOUTH ONCE DAILY FOR SUPPLEMENT.   DULoxetine 60 MG capsule Commonly known as: CYMBALTA Take two capsules by mouth every  morning   esomeprazole 20 MG capsule Commonly known as: NEXIUM TAKE 1 CAPSULE BY MOUTH AT LEAST 30 MINUTES BEFORE FOOD, ONCE DAILY FOR REFLUX   feeding supplement Liqd Take 237 mLs by mouth 3 (three) times daily between meals.   gabapentin 100 MG capsule Commonly known as: NEURONTIN Take 200 mg by mouth 3 (three) times daily.   ibuprofen 800 MG tablet Commonly known as: ADVIL Take 800 mg by mouth every 6 (six) hours as needed.   ketoconazole 2 % shampoo Commonly known as: NIZORAL SHAMPOO HAIR 3 TIMES WEEKLY FOR FUNGAL INFECTION   ketoconazole 2 % cream Commonly known as: NIZORAL Apply topically daily.   LORazepam 0.5 MG tablet Commonly known as: ATIVAN Take 1 mg by mouth. 1 tablet PO for agitation greater than 10 mint, may repeat in 30 mins.   mometasone 0.1 % lotion Commonly known as: ELOCON Apply topically daily.   QUEtiapine 200 MG 24 hr tablet Commonly known as: SEROQUEL XR   Refresh Liquigel 1 % Gel Generic drug: Carboxymethylcellulose Sodium INSTILL 1 DROP INTO EACH EYE 3 TIMES A DAY   tamsulosin 0.4 MG Caps capsule Commonly known as: FLOMAX Take 1 capsule (0.4 mg total) by mouth daily.   traZODone 50 MG tablet Commonly known as: DESYREL TAKE 1  TABLET BY MOUTH EACH DAY AT BEDTIME FOR SLEEP       Allergies: No Known Allergies  Family History: Family History  Problem Relation Age of Onset  . Kidney disease Neg Hx     Social History:  reports that he has quit smoking. His smoking use included pipe. He has never used smokeless tobacco. He reports that he does not drink alcohol or use drugs.  ROS:  Physical Exam: Constitutional:  Well nourished. Agitated.   HEENT:  AT, moist mucus membranes.  Trachea midline, no masses. Cardiovascular: No clubbing, cyanosis, or edema. Respiratory: Normal respiratory effort, no increased work of breathing. GI: Abdomen is soft, non tender, non distended, no abdominal masses. Liver and spleen not palpable.  No  hernias appreciated.  Stool sample for occult testing is not indicated.   GU: No CVA tenderness.  No bladder fullness or masses.  Patient with circumcised phallus.  Urethral meatus is patent.  No penile discharge. No penile lesions or rashes. Scrotum without lesions, cysts, rashes and/or edema.  Testicles are located scrotally bilaterally. No masses are appreciated in the testicles. Left and right epididymis are normal. Rectal: Patient with  normal sphincter tone. Anus and perineum without scarring or rashes. No rectal masses are appreciated. Prostate is approximately 45 grams, no nodules are appreciated. Seminal vesicles are normal. Skin: No rashes, bruises or suspicious lesions. Lymph: No inguinal adenopathy. Neurologic: Grossly intact, no focal deficits, moving all 4 extremities. Psychiatric: Normal mood and affect.    Laboratory Data: Lab Results  Component Value Date   WBC 5.7 03/23/2019   HGB 12.1 (L) 03/23/2019   HCT 35.3 (L) 03/23/2019   MCV 95.4 03/23/2019   PLT 324 03/23/2019    Lab Results  Component Value Date   CREATININE 0.94 03/23/2019     Results for orders placed or performed in visit on 07/29/19  PSA  Result Value Ref Range   Prostate Specific Ag, Serum 0.2 0.0 - 4.0 ng/mL    Previous PSA's:      0.3 ng/mL on 05/11/2014      0.3 ng/mL on 06/21/2015      0.2 ng/mL on 06/20/2016      0.3 ng/mL on 06/2017      0.3 ng/mL on 07/2018        I have reviewed the labs  Assessment & Plan:    1. BPH (benign prostatic hyperplasia) with LU TS Continue tamsulosin 0.4 mg daily - refills given  Return in about 1 year (around 08/04/2020) for PVR .  Michiel Cowboy, PA-C  [facility] [address] [place], [place] [address] [phone]

## 2019-08-04 NOTE — Telephone Encounter (Signed)
Aaron Foster with [facility] called to make pt appt today due to changes in pt's behavior, not eating, and sweating. She thinks it may be due to med change when pt was prescribed  baclofen (LIORESAL) 20 MG tablet. Pt complains he hurts all day, he is hot, she states he is "all out of whack"  Aaron Foster would like to know if Dr Ancil Boozer will work him in this week.  If not, pt has appt Friday with Leisa. I n the meantime, she wants to know should they continue the baclofen? Would like a call back please. [phone]

## 2019-08-05 ENCOUNTER — Other Ambulatory Visit: Payer: Self-pay

## 2019-08-05 ENCOUNTER — Ambulatory Visit (INDEPENDENT_AMBULATORY_CARE_PROVIDER_SITE_OTHER): Payer: Medicare Other | Admitting: Urology

## 2019-08-05 DIAGNOSIS — N138 Other obstructive and reflux uropathy: Secondary | ICD-10-CM | POA: Diagnosis not present

## 2019-08-05 DIAGNOSIS — N401 Enlarged prostate with lower urinary tract symptoms: Secondary | ICD-10-CM | POA: Diagnosis not present

## 2019-08-05 MED ORDER — TAMSULOSIN HCL 0.4 MG PO CAPS: 0.4000 mg | ORAL_CAPSULE | Freq: Every day | ORAL | 3 refills | Status: AC

## 2019-08-05 NOTE — Telephone Encounter (Signed)
Spoke to [place], she understands your directions.

## 2019-08-06 ENCOUNTER — Ambulatory Visit (INDEPENDENT_AMBULATORY_CARE_PROVIDER_SITE_OTHER): Payer: Medicare Other | Admitting: Family Medicine

## 2019-08-06 ENCOUNTER — Encounter: Payer: Self-pay | Admitting: Family Medicine

## 2019-08-06 VITALS — BP 122/70 | HR 90 | Temp 97.3°F | Resp 16 | Ht 63.0 in | Wt 113.4 lb

## 2019-08-06 DIAGNOSIS — Z23 Encounter for immunization: Secondary | ICD-10-CM | POA: Diagnosis not present

## 2019-08-06 DIAGNOSIS — R4689 Other symptoms and signs involving appearance and behavior: Secondary | ICD-10-CM | POA: Diagnosis not present

## 2019-08-06 DIAGNOSIS — G894 Chronic pain syndrome: Secondary | ICD-10-CM | POA: Diagnosis not present

## 2019-08-06 DIAGNOSIS — D649 Anemia, unspecified: Secondary | ICD-10-CM

## 2019-08-06 DIAGNOSIS — M62838 Other muscle spasm: Secondary | ICD-10-CM

## 2019-08-06 MED ORDER — ORPHENADRINE CITRATE ER 100 MG PO TB12: 100.0000 mg | ORAL_TABLET | Freq: Two times a day (BID) | ORAL | 5 refills | Status: AC

## 2019-08-06 NOTE — Patient Instructions (Signed)

## 2019-08-06 NOTE — Progress Notes (Signed)
Name: Aaron Foster   MRN: [id number]    DOB: Mar 30, 1946   Date:08/06/2019       Progress Note  Subjective  Chief Complaint  Chief Complaint  Patient presents with  . Medication Management    Patient not tolerating Baclofen well. Nurse wants him back on Orphenadrine 100 mg. He has not been right since starting the Baclofen.    HPI  Behavior changes: [name] has a long history of pain and muscle spasms. He was doing well on Norflex, however insurance was stopped covering it. We switched to Tizanidine but it made him sleepy, and was than switched to skelaxin that stopped being covered and last week we changed it again to baclofen, patients behavior went down hill with the change in medication. She became agitated, complaining of pain all over, throwing things at the staff, irritable mood, also stopped eating and once did not want to get out of bed. Pharmacy had an old rx of norflex and he took one dose last night he is doing better since. This morning caregiver Aaron Foster( Aaron Foster ) states he is doing much better, we will try PA if needed and go back to norflex today. Also explained since he has difficulty communicating we will check some labs and urine to make sure not missing another cause for his behavior change  Anemia: we will recheck labs  Malnutrition: we will hand her a list of high calorie diet   Patient Active Problem List   Diagnosis Date Noted  . OSA on CPAP 06/23/2018  . OSA (obstructive sleep apnea) 03/18/2018  . Vitamin D deficiency 04/08/2017  . Scoliosis 02/22/2016  . Recurrent falls 08/04/2015  . Dermatitis seborrheica 06/24/2015  . Chronic constipation 06/24/2015  . Lives in group home 06/24/2015  . Protein calorie malnutrition (HCC) 06/24/2015  . Hearing loss of both ears 06/24/2015  . Wears hearing aid 06/24/2015  . History of iron deficiency anemia 06/24/2015  . Osteoporosis 06/24/2015  . Adult behavior problem 06/24/2015  . Hiatal hernia 06/24/2015  .  Hemorrhoid 06/24/2015  . Gait instability 06/24/2015  . DDD (degenerative disc disease), lumbar 06/24/2015  . Nephrolithiasis 06/24/2015  . Hyperlipidemia 06/24/2015  . Compression fracture of L1 lumbar vertebra (HCC) 06/24/2015  . Family history of upper GI bleeding   . BPH with obstruction/lower urinary tract symptoms 06/21/2015  . Chronic pain 06/19/2015  . Acid reflux 06/19/2015  . Intellectual disability 06/19/2015    Past Surgical History:  Procedure Laterality Date  . arm fracture    . circumscision      Family History  Problem Relation Age of Onset  . Kidney disease Neg Hx     Social History   Socioeconomic History  . Marital status: Single    Spouse name: Not on file  . Number of children: 0  . Years of education: unknown  . Highest education level: Not on file  Occupational History  . Occupation: Retired  Engineer, productionocial Needs  . Financial resource strain: Not hard at all  . Food insecurity    Worry: Never true    Inability: Never true  . Transportation needs    Medical: No    Non-medical: No  Tobacco Use  . Smoking status: Former Smoker    Types: Pipe  . Smokeless tobacco: Never Used  . Tobacco comment: unknown smoking history; smoking cessation materials not required  Substance and Sexual Activity  . Alcohol use: No    Alcohol/week: 0.0 standard drinks  . Drug use: No  .  Sexual activity: Not Currently  Lifestyle  . Physical activity    Days per week: 0 days    Minutes per session: 0 min  . Stress: Not at all  Relationships  . Social Herbalist on phone: Patient refused    Gets together: Patient refused    Attends religious service: Patient refused    Active member of club or organization: Patient refused    Attends meetings of clubs or organizations: Patient refused    Relationship status: Patient refused  . Intimate partner violence    Fear of current or ex partner: No    Emotionally abused: No    Physically abused: No    Forced  sexual activity: No  Other Topics Concern  . Not on file  Social History Narrative  . Not on file     Current Outpatient Medications:  .  acetaminophen (ACETAMINOPHEN EXTRA STRENGTH) 500 MG tablet, TAKE 2 TABLETS BY MOUTH 3 TIMES DAILY FOR PAIN, Disp: 90 tablet, Rfl: 5 .  AMITIZA 24 MCG capsule, TAKE 1 CAPSULE BY MOUTH 2 TIMES PER DAY WITH MEAL, Disp: 60 capsule, Rfl: 5 .  ASPIRIN LOW DOSE 81 MG EC tablet, TAKE 1 TABLET BY MOUTH ONCE EVERY DAY FOR CIRCULATION, Disp: 30 tablet, Rfl: 5 .  baclofen (LIORESAL) 20 MG tablet, Take 1 tablet (20 mg total) by mouth 3 (three) times daily., Disp: 90 each, Rfl: 2 .  Calcium Carbonate-Vitamin D3 600-400 MG-UNIT TABS, Take by mouth., Disp: , Rfl:  .  D3-1000 25 MCG (1000 UT) tablet, TAKE 1 TABLET BY MOUTH ONCE DAILY FOR SUPPLEMENT., Disp: 30 tablet, Rfl: 5 .  DULoxetine (CYMBALTA) 60 MG capsule, Take two capsules by mouth every morning, Disp: , Rfl:  .  esomeprazole (NEXIUM) 20 MG capsule, TAKE 1 CAPSULE BY MOUTH AT LEAST 30 MINUTES BEFORE FOOD, ONCE DAILY FOR REFLUX, Disp: 30 capsule, Rfl: 5 .  feeding supplement (BOOST HIGH PROTEIN) LIQD, Take 237 mLs by mouth 3 (three) times daily between meals., Disp: 90 Can, Rfl: 5 .  gabapentin (NEURONTIN) 100 MG capsule, Take 200 mg by mouth 3 (three) times daily., Disp: , Rfl:  .  ibuprofen (ADVIL,MOTRIN) 800 MG tablet, Take 800 mg by mouth every 6 (six) hours as needed., Disp: , Rfl:  .  ketoconazole (NIZORAL) 2 % cream, Apply topically daily., Disp: 60 g, Rfl: 11 .  ketoconazole (NIZORAL) 2 % shampoo, SHAMPOO HAIR 3 TIMES WEEKLY FOR FUNGAL INFECTION, Disp: 200 mL, Rfl: 5 .  LORazepam (ATIVAN) 0.5 MG tablet, Take 1 mg by mouth. 1 tablet PO for agitation greater than 10 mint, may repeat in 30 mins., Disp: , Rfl:  .  mometasone (ELOCON) 0.1 % lotion, Apply topically daily., Disp: , Rfl:  .  QUEtiapine (SEROQUEL XR) 200 MG 24 hr tablet, , Disp: , Rfl:  .  REFRESH LIQUIGEL 1 % GEL, INSTILL 1 DROP INTO EACH EYE 3  TIMES A DAY, Disp: 15 mL, Rfl: 5 .  tamsulosin (FLOMAX) 0.4 MG CAPS capsule, Take 1 capsule (0.4 mg total) by mouth daily., Disp: 90 capsule, Rfl: 3 .  traZODone (DESYREL) 50 MG tablet, TAKE 1 TABLET BY MOUTH EACH DAY AT BEDTIME FOR SLEEP, Disp: 30 tablet, Rfl: 5  No Known Allergies  I personally reviewed active problem list, medication list, allergies, family history, social history, health maintenance with the patient/caregiver today.   ROS  Ten systems reviewed and is negative except as mentioned in HPI  Objective  Vitals:  08/06/19 0823  BP: 122/70  Pulse: 90  Resp: 16  Temp: (!) 97.3 F (36.3 C)  TempSrc: Temporal  SpO2: 98%  Weight: 113 lb 6.4 oz (51.4 kg)  Height: 5\' 3"  (1.6 m)    Body mass index is 20.09 kg/m.  Physical Exam  Constitutional: Patient appears small and malnourished   No distress.  HEENT: head atraumatic, normocephalic, pupils equal and reactive to light, Cardiovascular: Normal rate, regular rhythm and normal heart sounds.  No murmur heard. No BLE edema. Pulmonary/Chest: Effort normal and breath sounds normal. No respiratory distress. Abdominal: Soft.  There is no tenderness. Muscular skeletal: he uses a walker, brace on left wrist, pain during palpation of lumbar spine  Psychiatric: non verbal, uses hands and gestures but was pleasant during the visit   Recent Results (from the past 2160 hour(s))  PSA     Status: None   Collection Time: 07/29/19  3:11 PM  Result Value Ref Range   Prostate Specific Ag, Serum 0.2 0.0 - 4.0 ng/mL    Comment: Roche ECLIA methodology. According to the American Urological Association, Serum PSA should decrease and remain at undetectable levels after radical prostatectomy. The AUA defines biochemical recurrence as an initial PSA value 0.2 ng/mL or greater followed by a subsequent confirmatory PSA value 0.2 ng/mL or greater. Values obtained with different assay methods or kits cannot be used interchangeably.  Results cannot be interpreted as absolute evidence of the presence or absence of malignant disease.       PHQ2/9: Depression screen [facility] 2/9 11/21/2018 02/25/2017 02/22/2016 06/24/2015  Decreased Interest 0 0 0 0  Down, Depressed, Hopeless 0 0 0 0  PHQ - 2 Score 0 0 0 0       Fall Risk: Fall Risk  08/06/2019 06/24/2019 04/09/2019 03/23/2019 04/03/2018  Falls in the past year? 0 1 1 1  Yes  Number falls in past yr: 0 1 1 1 2  or more  Injury with Fall? 0 0 1 0 No  Risk Factor Category  - - - - High Fall Risk  Risk for fall due to : - Impaired balance/gait;Impaired mobility History of fall(s);Impaired balance/gait;Impaired mobility Impaired balance/gait Impaired vision;History of fall(s);Impaired balance/gait;Medication side effect;Impaired mobility  Risk for fall due to: Comment - - - - wears eyeglasses  Follow up - - - - Falls evaluation completed;Education provided;Falls prevention discussed     Functional Status Survey: Is the patient deaf or have difficulty hearing?: No Does the patient have difficulty seeing, even when wearing glasses/contacts?: No Does the patient have difficulty concentrating, remembering, or making decisions?: Yes Does the patient have difficulty walking or climbing stairs?: Yes Does the patient have difficulty dressing or bathing?: Yes Does the patient have difficulty doing errands alone such as visiting a doctor's office or shopping?: Yes   Assessment & Plan  1. Behavioral change  - CBC with Differential/Platelet - COMPLETE METABOLIC PANEL WITH GFR - POCT urinalysis dipstick  2. Need for immunization against influenza  - Flu Vaccine QUAD High Dose(Fluad)  3. Chronic pain syndrome  - orphenadrine (NORFLEX) 100 MG tablet; Take 1 tablet (100 mg total) by mouth 2 (two) times daily.  Dispense: 60 tablet; Refill: 5  4. Muscle spasm  - orphenadrine (NORFLEX) 100 MG tablet; Take 1 tablet (100 mg total) by mouth 2 (two) times daily.  Dispense: 60 tablet;  Refill: 5  5. Anemia, unspecified type  - CBC with Differential/Platelet - Iron, TIBC and Ferritin Panel

## 2019-08-07 ENCOUNTER — Ambulatory Visit: Payer: Medicare Other | Admitting: Family Medicine

## 2019-08-07 LAB — CBC WITH DIFFERENTIAL/PLATELET
Absolute Monocytes: 718 cells/uL (ref 200–950)
Basophils Absolute: 69 cells/uL (ref 0–200)
Basophils Relative: 1 %
Eosinophils Absolute: 235 cells/uL (ref 15–500)
Eosinophils Relative: 3.4 %
HCT: 35.6 % — ABNORMAL LOW (ref 38.5–50.0)
Hemoglobin: 11.8 g/dL — ABNORMAL LOW (ref 13.2–17.1)
Lymphs Abs: 1760 cells/uL (ref 850–3900)
MCH: 32.1 pg (ref 27.0–33.0)
MCHC: 33.1 g/dL (ref 32.0–36.0)
MCV: 96.7 fL (ref 80.0–100.0)
MPV: 9 fL (ref 7.5–12.5)
Monocytes Relative: 10.4 %
Neutro Abs: 4119 cells/uL (ref 1500–7800)
Neutrophils Relative %: 59.7 %
Platelets: 358 10*3/uL (ref 140–400)
RBC: 3.68 10*6/uL — ABNORMAL LOW (ref 4.20–5.80)
RDW: 11.7 % (ref 11.0–15.0)
Total Lymphocyte: 25.5 %
WBC: 6.9 10*3/uL (ref 3.8–10.8)

## 2019-08-07 LAB — COMPLETE METABOLIC PANEL WITH GFR
AG Ratio: 1.7 (calc) (ref 1.0–2.5)
ALT: 13 U/L (ref 9–46)
AST: 17 U/L (ref 10–35)
Albumin: 4.2 g/dL (ref 3.6–5.1)
Alkaline phosphatase (APISO): 60 U/L (ref 35–144)
BUN: 25 mg/dL (ref 7–25)
CO2: 32 mmol/L (ref 20–32)
Calcium: 9.4 mg/dL (ref 8.6–10.3)
Chloride: 104 mmol/L (ref 98–110)
Creat: 1.07 mg/dL (ref 0.70–1.18)
GFR, Est African American: 80 mL/min/{1.73_m2} (ref >=60)
GFR, Est Non African American: 69 mL/min/{1.73_m2} (ref >=60)
Globulin: 2.5 g/dL (calc) (ref 1.9–3.7)
Glucose, Bld: 93 mg/dL (ref 65–99)
Potassium: 4 mmol/L (ref 3.5–5.3)
Sodium: 143 mmol/L (ref 135–146)
Total Bilirubin: 0.6 mg/dL (ref 0.2–1.2)
Total Protein: 6.7 g/dL (ref 6.1–8.1)

## 2019-08-07 LAB — IRON,TIBC AND FERRITIN PANEL
%SAT: 20 % (calc) (ref 20–48)
Ferritin: 23 ng/mL — ABNORMAL LOW (ref 24–380)
Iron: 63 ug/dL (ref 50–180)
TIBC: 315 mcg/dL (calc) (ref 250–425)

## 2019-08-14 ENCOUNTER — Other Ambulatory Visit: Payer: Self-pay

## 2019-08-14 DIAGNOSIS — R4689 Other symptoms and signs involving appearance and behavior: Secondary | ICD-10-CM

## 2019-08-14 LAB — POCT URINALYSIS DIPSTICK
Bilirubin, UA: NEGATIVE
Blood, UA: NEGATIVE
Glucose, UA: NEGATIVE
Ketones, UA: NEGATIVE
Leukocytes, UA: NEGATIVE
Nitrite, UA: NEGATIVE
Protein, UA: NEGATIVE
Spec Grav, UA: 1.015 (ref 1.010–1.025)
Urobilinogen, UA: 0.2 E.U./dL
pH, UA: 7.5 (ref 5.0–8.0)

## 2019-08-24 DIAGNOSIS — G5602 Carpal tunnel syndrome, left upper limb: Secondary | ICD-10-CM | POA: Diagnosis not present

## 2019-08-26 ENCOUNTER — Other Ambulatory Visit: Payer: Self-pay | Admitting: Family Medicine

## 2019-08-27 ENCOUNTER — Ambulatory Visit: Payer: Medicare Other

## 2019-08-28 DIAGNOSIS — Z01812 Encounter for preprocedural laboratory examination: Secondary | ICD-10-CM | POA: Diagnosis not present

## 2019-08-28 DIAGNOSIS — Z20828 Contact with and (suspected) exposure to other viral communicable diseases: Secondary | ICD-10-CM | POA: Diagnosis not present

## 2019-09-21 ENCOUNTER — Emergency Department
Admission: EM | Admit: 2019-09-21 | Discharge: 2019-09-21 | Disposition: A | Discharge status: Other Acute Inpatient Hospital | Payer: Medicare Other | Attending: Emergency Medicine | Admitting: Emergency Medicine

## 2019-09-21 ENCOUNTER — Other Ambulatory Visit: Payer: Self-pay

## 2019-09-21 ENCOUNTER — Emergency Department: Payer: Medicare Other

## 2019-09-21 ENCOUNTER — Inpatient Hospital Stay (HOSPITAL_COMMUNITY)
Admission: AD | Admit: 2019-09-21 | Discharge: 2019-09-28 | DRG: 177 | Disposition: A | Discharge status: Home / Self Care | Payer: Medicare Other | Source: Other Acute Inpatient Hospital | Attending: Internal Medicine | Admitting: Internal Medicine

## 2019-09-21 DIAGNOSIS — M549 Dorsalgia, unspecified: Secondary | ICD-10-CM | POA: Diagnosis present

## 2019-09-21 DIAGNOSIS — F79 Unspecified intellectual disabilities: Secondary | ICD-10-CM | POA: Diagnosis present

## 2019-09-21 DIAGNOSIS — R0602 Shortness of breath: Secondary | ICD-10-CM | POA: Diagnosis not present

## 2019-09-21 DIAGNOSIS — R4182 Altered mental status, unspecified: Secondary | ICD-10-CM | POA: Diagnosis not present

## 2019-09-21 DIAGNOSIS — M810 Age-related osteoporosis without current pathological fracture: Secondary | ICD-10-CM | POA: Diagnosis present

## 2019-09-21 DIAGNOSIS — Z79899 Other long term (current) drug therapy: Secondary | ICD-10-CM | POA: Diagnosis not present

## 2019-09-21 DIAGNOSIS — Z66 Do not resuscitate: Secondary | ICD-10-CM | POA: Diagnosis present

## 2019-09-21 DIAGNOSIS — G8929 Other chronic pain: Secondary | ICD-10-CM | POA: Diagnosis present

## 2019-09-21 DIAGNOSIS — F69 Unspecified disorder of adult personality and behavior: Secondary | ICD-10-CM | POA: Diagnosis present

## 2019-09-21 DIAGNOSIS — U071 COVID-19: Secondary | ICD-10-CM | POA: Insufficient documentation

## 2019-09-21 DIAGNOSIS — M545 Low back pain: Secondary | ICD-10-CM | POA: Diagnosis not present

## 2019-09-21 DIAGNOSIS — R000 Tachycardia, unspecified: Secondary | ICD-10-CM | POA: Diagnosis not present

## 2019-09-21 DIAGNOSIS — I959 Hypotension, unspecified: Secondary | ICD-10-CM | POA: Diagnosis not present

## 2019-09-21 DIAGNOSIS — Z87442 Personal history of urinary calculi: Secondary | ICD-10-CM

## 2019-09-21 DIAGNOSIS — F419 Anxiety disorder, unspecified: Secondary | ICD-10-CM | POA: Diagnosis present

## 2019-09-21 DIAGNOSIS — R918 Other nonspecific abnormal finding of lung field: Secondary | ICD-10-CM | POA: Diagnosis present

## 2019-09-21 DIAGNOSIS — G4700 Insomnia, unspecified: Secondary | ICD-10-CM | POA: Diagnosis present

## 2019-09-21 DIAGNOSIS — Z87891 Personal history of nicotine dependence: Secondary | ICD-10-CM | POA: Diagnosis not present

## 2019-09-21 DIAGNOSIS — Z7982 Long term (current) use of aspirin: Secondary | ICD-10-CM

## 2019-09-21 DIAGNOSIS — J9601 Acute respiratory failure with hypoxia: Secondary | ICD-10-CM | POA: Diagnosis present

## 2019-09-21 DIAGNOSIS — E785 Hyperlipidemia, unspecified: Secondary | ICD-10-CM | POA: Diagnosis present

## 2019-09-21 DIAGNOSIS — J9600 Acute respiratory failure, unspecified whether with hypoxia or hypercapnia: Secondary | ICD-10-CM | POA: Diagnosis present

## 2019-09-21 DIAGNOSIS — K219 Gastro-esophageal reflux disease without esophagitis: Secondary | ICD-10-CM | POA: Diagnosis present

## 2019-09-21 DIAGNOSIS — R0689 Other abnormalities of breathing: Secondary | ICD-10-CM | POA: Diagnosis not present

## 2019-09-21 DIAGNOSIS — R0902 Hypoxemia: Secondary | ICD-10-CM | POA: Diagnosis not present

## 2019-09-21 DIAGNOSIS — L219 Seborrheic dermatitis, unspecified: Secondary | ICD-10-CM | POA: Diagnosis present

## 2019-09-21 DIAGNOSIS — J1289 Other viral pneumonia: Secondary | ICD-10-CM | POA: Diagnosis present

## 2019-09-21 DIAGNOSIS — F329 Major depressive disorder, single episode, unspecified: Secondary | ICD-10-CM | POA: Diagnosis present

## 2019-09-21 DIAGNOSIS — F05 Delirium due to known physiological condition: Secondary | ICD-10-CM | POA: Diagnosis present

## 2019-09-21 DIAGNOSIS — N400 Enlarged prostate without lower urinary tract symptoms: Secondary | ICD-10-CM | POA: Diagnosis present

## 2019-09-21 DIAGNOSIS — R404 Transient alteration of awareness: Secondary | ICD-10-CM | POA: Diagnosis not present

## 2019-09-21 DIAGNOSIS — F99 Mental disorder, not otherwise specified: Secondary | ICD-10-CM | POA: Diagnosis not present

## 2019-09-21 DIAGNOSIS — J1282 Pneumonia due to coronavirus disease 2019: Secondary | ICD-10-CM | POA: Diagnosis present

## 2019-09-21 LAB — PROCALCITONIN: Procalcitonin: 0.32 ng/mL

## 2019-09-21 LAB — C-REACTIVE PROTEIN: CRP: 17.5 mg/dL — ABNORMAL HIGH (ref <1.0)

## 2019-09-21 LAB — COMPREHENSIVE METABOLIC PANEL
ALT: 27 U/L (ref 0–44)
AST: 71 U/L — ABNORMAL HIGH (ref 15–41)
Albumin: 3.6 g/dL (ref 3.5–5.0)
Alkaline Phosphatase: 48 U/L (ref 38–126)
Anion gap: 15 (ref 5–15)
BUN: 27 mg/dL — ABNORMAL HIGH (ref 8–23)
CO2: 24 mmol/L (ref 22–32)
Calcium: 8.7 mg/dL — ABNORMAL LOW (ref 8.9–10.3)
Chloride: 102 mmol/L (ref 98–111)
Creatinine, Ser: 1 mg/dL (ref 0.61–1.24)
GFR calc Af Amer: >60 mL/min (ref >60)
GFR calc non Af Amer: >60 mL/min (ref >60)
Glucose, Bld: 96 mg/dL (ref 70–99)
Potassium: 4 mmol/L (ref 3.5–5.1)
Sodium: 141 mmol/L (ref 135–145)
Total Bilirubin: 0.9 mg/dL (ref 0.3–1.2)
Total Protein: 7 g/dL (ref 6.5–8.1)

## 2019-09-21 LAB — CBC WITH DIFFERENTIAL/PLATELET
Abs Immature Granulocytes: 0.03 10*3/uL (ref 0.00–0.07)
Basophils Absolute: 0 10*3/uL (ref 0.0–0.1)
Basophils Relative: 0 %
Eosinophils Absolute: 0 10*3/uL (ref 0.0–0.5)
Eosinophils Relative: 0 %
HCT: 34.8 % — ABNORMAL LOW (ref 39.0–52.0)
Hemoglobin: 11.5 g/dL — ABNORMAL LOW (ref 13.0–17.0)
Immature Granulocytes: 1 %
Lymphocytes Relative: 12 %
Lymphs Abs: 0.5 10*3/uL — ABNORMAL LOW (ref 0.7–4.0)
MCH: 30.7 pg (ref 26.0–34.0)
MCHC: 33 g/dL (ref 30.0–36.0)
MCV: 93 fL (ref 80.0–100.0)
Monocytes Absolute: 0.3 10*3/uL (ref 0.1–1.0)
Monocytes Relative: 7 %
Neutro Abs: 3.6 10*3/uL (ref 1.7–7.7)
Neutrophils Relative %: 80 %
Platelets: 216 10*3/uL (ref 150–400)
RBC: 3.74 MIL/uL — ABNORMAL LOW (ref 4.22–5.81)
RDW: 12.4 % (ref 11.5–15.5)
WBC: 4.5 10*3/uL (ref 4.0–10.5)
nRBC: 0 % (ref 0.0–0.2)

## 2019-09-21 LAB — FIBRIN DERIVATIVES D-DIMER (ARMC ONLY): Fibrin derivatives D-dimer (ARMC): 2331.28 ng/mL (FEU) — ABNORMAL HIGH (ref 0.00–499.00)

## 2019-09-21 LAB — TRIGLYCERIDES: Triglycerides: 94 mg/dL (ref <150)

## 2019-09-21 LAB — FERRITIN: Ferritin: 518 ng/mL — ABNORMAL HIGH (ref 24–336)

## 2019-09-21 LAB — LACTIC ACID, PLASMA
Lactic Acid, Venous: 2 mmol/L (ref 0.5–1.9)
Lactic Acid, Venous: 0.8 mmol/L (ref 0.5–1.9)

## 2019-09-21 LAB — SARS CORONAVIRUS 2 BY RT PCR (HOSPITAL ORDER, PERFORMED IN ~~LOC~~ HOSPITAL LAB): SARS Coronavirus 2: POSITIVE — AB

## 2019-09-21 LAB — FIBRINOGEN: Fibrinogen: 594 mg/dL — ABNORMAL HIGH (ref 210–475)

## 2019-09-21 LAB — LACTATE DEHYDROGENASE: LDH: 524 U/L — ABNORMAL HIGH (ref 98–192)

## 2019-09-21 MED ORDER — LORAZEPAM 2 MG/ML IJ SOLN
INTRAMUSCULAR | Status: AC
  Administered 2019-09-21: 21:00:00
  Filled 2019-09-21: qty 1

## 2019-09-21 MED ORDER — ENOXAPARIN SODIUM 30 MG/0.3ML ~~LOC~~ SOLN
30.0000 mg | Freq: Two times a day (BID) | SUBCUTANEOUS | Status: DC
  Administered 2019-09-21 – 2019-09-22 (×2): 30 mg via SUBCUTANEOUS
  Filled 2019-09-21: qty 0.3

## 2019-09-21 MED ORDER — SODIUM CHLORIDE 0.9 % IV SOLN
200.0000 mg | Freq: Once | INTRAVENOUS | Status: AC
  Administered 2019-09-21: 200 mg via INTRAVENOUS
  Filled 2019-09-21: qty 40

## 2019-09-21 MED ORDER — LORAZEPAM 2 MG/ML IJ SOLN
2.0000 mg | Freq: Once | INTRAMUSCULAR | Status: AC
  Administered 2019-09-21: 08:00:00 2 mg via INTRAMUSCULAR
  Filled 2019-09-21: qty 1

## 2019-09-21 MED ORDER — IOHEXOL 350 MG/ML SOLN
75.0000 mL | Freq: Once | INTRAVENOUS | Status: AC | PRN
  Administered 2019-09-21: 75 mL via INTRAVENOUS

## 2019-09-21 MED ORDER — SODIUM CHLORIDE 0.9% FLUSH: 3.0000 mL | INTRAVENOUS | Status: DC | PRN

## 2019-09-21 MED ORDER — DEXAMETHASONE SODIUM PHOSPHATE 10 MG/ML IJ SOLN: 8.0000 mg | INTRAMUSCULAR | Status: DC

## 2019-09-21 MED ORDER — HYDROCOD POLST-CPM POLST ER 10-8 MG/5ML PO SUER: 5.0000 mL | Freq: Two times a day (BID) | ORAL | Status: DC | PRN

## 2019-09-21 MED ORDER — DEXAMETHASONE SODIUM PHOSPHATE 10 MG/ML IJ SOLN
6.0000 mg | INTRAMUSCULAR | Status: DC
  Administered 2019-09-22 – 2019-09-25 (×4): 6 mg via INTRAVENOUS
  Filled 2019-09-21 (×4): qty 1

## 2019-09-21 MED ORDER — LORAZEPAM 2 MG/ML IJ SOLN
1.0000 mg | Freq: Once | INTRAMUSCULAR | Status: AC
  Administered 2019-09-21: 1 mg via INTRAVENOUS
  Filled 2019-09-21: qty 1

## 2019-09-21 MED ORDER — DEXAMETHASONE SODIUM PHOSPHATE 10 MG/ML IJ SOLN
10.0000 mg | Freq: Once | INTRAMUSCULAR | Status: AC
  Administered 2019-09-21: 10 mg via INTRAVENOUS
  Filled 2019-09-21: qty 1

## 2019-09-21 MED ORDER — LORAZEPAM 2 MG/ML IJ SOLN
1.0000 mg | INTRAMUSCULAR | Status: DC | PRN
  Administered 2019-09-21 – 2019-09-28 (×10): 1 mg via INTRAVENOUS
  Filled 2019-09-21 (×10): qty 1

## 2019-09-21 MED ORDER — ONDANSETRON HCL 4 MG PO TABS: 4.0000 mg | ORAL_TABLET | Freq: Four times a day (QID) | ORAL | Status: DC | PRN

## 2019-09-21 MED ORDER — SODIUM CHLORIDE 0.9 % IV SOLN: 250.0000 mL | INTRAVENOUS | Status: DC | PRN

## 2019-09-21 MED ORDER — SODIUM CHLORIDE 0.9 % IV SOLN
100.0000 mg | INTRAVENOUS | Status: DC
  Filled 2019-09-21: qty 20

## 2019-09-21 MED ORDER — VITAMIN C 500 MG PO TABS
500.0000 mg | ORAL_TABLET | Freq: Every day | ORAL | Status: DC
  Administered 2019-09-22 – 2019-09-28 (×7): 500 mg via ORAL
  Filled 2019-09-21 (×7): qty 1

## 2019-09-21 MED ORDER — ACETAMINOPHEN 325 MG PO TABS: 650.0000 mg | ORAL_TABLET | Freq: Four times a day (QID) | ORAL | Status: DC | PRN

## 2019-09-21 MED ORDER — ZINC SULFATE 220 (50 ZN) MG PO CAPS
220.0000 mg | ORAL_CAPSULE | Freq: Every day | ORAL | Status: DC
  Administered 2019-09-22 – 2019-09-28 (×7): 220 mg via ORAL
  Filled 2019-09-21 (×7): qty 1

## 2019-09-21 MED ORDER — ONDANSETRON HCL 4 MG/2ML IJ SOLN: 4.0000 mg | Freq: Four times a day (QID) | INTRAMUSCULAR | Status: DC | PRN

## 2019-09-21 MED ORDER — SODIUM CHLORIDE 0.9% FLUSH
3.0000 mL | Freq: Two times a day (BID) | INTRAVENOUS | Status: DC
  Administered 2019-09-21 – 2019-09-28 (×14): 3 mL via INTRAVENOUS

## 2019-09-21 MED ORDER — SODIUM CHLORIDE 0.9 % IV SOLN
100.0000 mg | INTRAVENOUS | Status: AC
  Administered 2019-09-22 – 2019-09-25 (×4): 100 mg via INTRAVENOUS
  Filled 2019-09-21 (×4): qty 100

## 2019-09-21 MED ORDER — GUAIFENESIN-DM 100-10 MG/5ML PO SYRP: 10.0000 mL | ORAL_SOLUTION | ORAL | Status: DC | PRN

## 2019-09-21 MED ORDER — SODIUM CHLORIDE 0.9 % IV SOLN
Freq: Once | INTRAVENOUS | Status: AC
  Administered 2019-09-21: 08:00:00 via INTRAVENOUS

## 2019-09-21 NOTE — ED Provider Notes (Signed)
Patient accepted in transfer to [facility]   Earleen Newport, MD 09/21/19 1044

## 2019-09-21 NOTE — ED Provider Notes (Addendum)
[facility] Emergency Department Provider Note       Time seen: ----------------------------------------- 10:00 AM on 09/21/2019 ----------------------------------------- Level V caveat: History/ROS limited by altered mental status  I have reviewed the triage vital signs and the nursing notes.  HISTORY   Chief Complaint Altered Mental Status    HPI Aaron Foster is a 73 y.o. male with a history of anemia, deafness, hyperlipidemia, mental retardation, compression fracture, chronic pain who presents to the ED for altered mental status since 4 AM this morning.  Patient is unable to provide any review of systems or report.  Temperature 102.7 obtained by EMS.  He currently lives in a group home, reportedly recently Covid positive  Past Medical History:  Diagnosis Date  . Anemia   . Bilateral renal cysts   . BPH (benign prostatic hyperplasia)   . Cannot hear   . Chronic back pain   . Constipation   . Esophageal reflux   . Hemorrhoid   . HLD (hyperlipidemia)   . Insomnia   . Nephrolithiasis   . Osteoporosis   . Phimosis   . Seborrheic dermatitis     Patient Active Problem List   Diagnosis Date Noted  . OSA on CPAP 06/23/2018  . OSA (obstructive sleep apnea) 03/18/2018  . Vitamin D deficiency 04/08/2017  . Scoliosis 02/22/2016  . Recurrent falls 08/04/2015  . Dermatitis seborrheica 06/24/2015  . Chronic constipation 06/24/2015  . Lives in group home 06/24/2015  . Protein calorie malnutrition ([place]) 06/24/2015  . Hearing loss of both ears 06/24/2015  . Wears hearing aid 06/24/2015  . History of iron deficiency anemia 06/24/2015  . Osteoporosis 06/24/2015  . Adult behavior problem 06/24/2015  . Hiatal hernia 06/24/2015  . Hemorrhoid 06/24/2015  . Gait instability 06/24/2015  . DDD (degenerative disc disease), lumbar 06/24/2015  . Nephrolithiasis 06/24/2015  . Hyperlipidemia 06/24/2015  . Compression fracture of L1 lumbar vertebra ([place])  06/24/2015  . Family history of upper GI bleeding   . BPH with obstruction/lower urinary tract symptoms 06/21/2015  . Chronic pain 06/19/2015  . Acid reflux 06/19/2015  . Intellectual disability 06/19/2015    Past Surgical History:  Procedure Laterality Date  . arm fracture    . circumscision      Allergies Patient has no known allergies.  Social History Social History   Tobacco Use  . Smoking status: Former Smoker    Types: Pipe  . Smokeless tobacco: Never Used  . Tobacco comment: unknown smoking history; smoking cessation materials not required  Substance Use Topics  . Alcohol use: No    Alcohol/week: 0.0 standard drinks  . Drug use: No    Review of Systems Unknown, reported altered mental status, fever, agitation  All systems negative/normal/unremarkable except as stated in the HPI  ____________________________________________   PHYSICAL EXAM:  VITAL SIGNS: ED Triage Vitals  Enc Vitals Group     BP 09/21/19 0729 (!) 134/102     Pulse Rate 09/21/19 0729 86     Resp 09/21/19 0729 (!) 23     Temp 09/21/19 0729 98.2 F (36.8 C)     Temp Source 09/21/19 0729 Axillary     SpO2 09/21/19 0729 (S) 92 %     Weight 09/21/19 0730 120 lb (54.4 kg)     Height 09/21/19 0730 5\' 9"  (1.753 m)     Head Circumference --      Peak Flow --      Pain Score --  Pain Loc --      Pain Edu? --      Excl. in GC? --     Constitutional: Alert but disoriented, agitated, mild to moderate distress Eyes: Conjunctivae are normal. Normal extraocular movements. ENT      Head: Normocephalic and atraumatic.      Nose: No congestion/rhinnorhea.      Mouth/Throat: Mucous membranes are moist.      Neck: No stridor. Cardiovascular: Normal rate, regular rhythm. No murmurs, rubs, or gallops. Respiratory: Normal respiratory effort without tachypnea nor retractions. Breath sounds are clear and equal bilaterally. No wheezes/rales/rhonchi. Gastrointestinal: Soft and nontender. Normal  bowel sounds Musculoskeletal: Nontender with normal range of motion in extremities. No lower extremity tenderness nor edema. Neurologic:  No gross focal neurologic deficits are appreciated.  Skin:  Skin is warm, dry and intact. No rash noted. Psychiatric: Agitated and occasionally aggressive ____________________________________________  EKG: Interpreted by me.  Sinus tachycardia with a rate of 100 bpm, left axis deviation, normal QT  ____________________________________________  ED COURSE:  As part of my medical decision making, I reviewed the following data within the electronic MEDICAL RECORD NUMBER History obtained from family if available, nursing notes, old chart and ekg, as well as notes from prior ED visits. Patient presented for altered mental status, we will assess with labs and imaging as indicated at this time. Clinical Course as of Sep 20 999  Mon Sep 21, 2019  0900 SARS Coronavirus 2(!): POSITIVE [JW]  0900 Lactic Acid, Venous(!!): 2.0 [JW]  0900 Fibrin derivatives D-dimer [facility])(!): 2,331.28 [JW]    Clinical Course User Index [JW] Emily Filbert, MD   Procedures  [name] Umar was evaluated in Emergency Department on 09/21/2019 for the symptoms described in the history of present illness. He was evaluated in the context of the global COVID-19 pandemic, which necessitated consideration that the patient might be at risk for infection with the SARS-CoV-2 virus that causes COVID-19. Institutional protocols and algorithms that pertain to the evaluation of patients at risk for COVID-19 are in a state of rapid change based on information released by regulatory bodies including the CDC and federal and state organizations. These policies and algorithms were followed during the patient's care in the ED.  ____________________________________________   LABS (pertinent positives/negatives)  Labs Reviewed  SARS CORONAVIRUS 2 BY RT PCR (HOSPITAL ORDER, PERFORMED IN [facility]) - Abnormal; Notable for the following components:      Result Value   SARS Coronavirus 2 POSITIVE (*)    All other components within normal limits  LACTIC ACID, PLASMA - Abnormal; Notable for the following components:   Lactic Acid, Venous 2.0 (*)    All other components within normal limits  CBC WITH DIFFERENTIAL/PLATELET - Abnormal; Notable for the following components:   RBC 3.74 (*)    Hemoglobin 11.5 (*)    HCT 34.8 (*)    Lymphs Abs 0.5 (*)    All other components within normal limits  COMPREHENSIVE METABOLIC PANEL - Abnormal; Notable for the following components:   BUN 27 (*)    Calcium 8.7 (*)    AST 71 (*)    All other components within normal limits  FIBRIN DERIVATIVES D-DIMER ([facility] ONLY) - Abnormal; Notable for the following components:   Fibrin derivatives D-dimer [facility]) 2,331.28 (*)    All other components within normal limits  LACTATE DEHYDROGENASE - Abnormal; Notable for the following components:   LDH 524 (*)    All other components within  normal limits  FERRITIN - Abnormal; Notable for the following components:   Ferritin 518 (*)    All other components within normal limits  FIBRINOGEN - Abnormal; Notable for the following components:   Fibrinogen 594 (*)    All other components within normal limits  CULTURE, BLOOD (ROUTINE X 2)  CULTURE, BLOOD (ROUTINE X 2)  PROCALCITONIN  TRIGLYCERIDES  LACTIC ACID, PLASMA  C-REACTIVE PROTEIN   CRITICAL CARE Performed by: Ulice DashJohnathan E Nevea Spiewak   Total critical care time: 30 minutes  Critical care time was exclusive of separately billable procedures and treating other patients.  Critical care was necessary to treat or prevent imminent or life-threatening deterioration.  Critical care was time spent personally by me on the following activities: development of treatment plan with patient and/or surrogate as well as nursing, discussions with consultants, evaluation of patient's response to treatment,  examination of patient, obtaining history from patient or surrogate, ordering and performing treatments and interventions, ordering and review of laboratory studies, ordering and review of radiographic studies, pulse oximetry and re-evaluation of patient's condition.   RADIOLOGY Images were viewed by me CTA chest IMPRESSION: 17 x 11 mm subpleural mass is noted posteriorly in the left lung apex concerning for possible neoplasm; PET scan is recommended for further evaluation.  No definite evidence of pulmonary embolus.  Multiple airspace opacities are noted bilaterally, right greater than left, consistent with multifocal pneumonia.  Aortic Atherosclerosis (ICD10-I70.0).  ____________________________________________   DIFFERENTIAL DIAGNOSIS   COVID-19, hypoxia, dehydration, electrolyte abnormality, sepsis  FINAL ASSESSMENT AND PLAN  COVID-19, hypoxia   Plan: The patient had presented for altered mental status.  Patient presented combative and aggressive at times, unclear if this is his baseline.  He had received multiple doses of Ativan and became more cooperative.  Patient's labs were consistent with COVID-19, mild lactic acidosis, elevated LDH, elevated D-dimer. Patient's imaging was negative for PE, he does have a left lung mass concerning for cancer.  Also multiple airspace opacities consistent with multifocal pneumonia.  Patient will require hospitalization for COVID-19 and hypoxia.  Lowest oxygen saturation was 86% off of oxygen.  [name] on between 2 and 3 L of oxygen by nasal cannula   Ulice DashJohnathan E Elexus Barman, MD    Note: This note was generated in part or whole with voice recognition software. Voice recognition is usually quite accurate but there are transcription errors that can and very often do occur. I apologize for any typographical errors that were not detected and corrected.     Emily FilbertWilliams, Jaydeen Odor E, MD 09/21/19 1005    Emily FilbertWilliams, Ashvik Grundman E, MD 09/21/19 1030

## 2019-09-21 NOTE — ED Notes (Signed)
Report given to [facility] and pt taken to [facility].

## 2019-09-21 NOTE — ED Notes (Signed)
Yvone Neu, leader of group home, [phone]  Harland Dingwall, Night nurse at group home number, [phone]. Can be called for medical information.

## 2019-09-21 NOTE — H&P (Signed)
History and Physical    Aaron RoverJoseph J Kaster ZOX:[id number] DOB: 1946/10/19 DOA: 09/21/2019  PCP: Alba CorySowles, Krichna, MD  Patient coming from: group home  Chief Complaint:  sob  HPI: Aaron Foster is a 73 y.o. male with medical history significant of mental retardation nonverbal, gerd, depression/anxiety dx with covid 4 days ago sent to ed for worsening sob and fever.  Pt cannot provide any history and is agitated.  Given several doses of ativan.  Ct scan suspect for lung mass but also with pna.  On 2 liters with good o2 sats.  Given decadron and remdisivir.  Referred for admisssion for covid pna.  Review of Systems: unobtainable due to MR  Past Medical History:  Diagnosis Date   Anemia    Bilateral renal cysts    BPH (benign prostatic hyperplasia)    Cannot hear    Chronic back pain    Constipation    Esophageal reflux    Hemorrhoid    HLD (hyperlipidemia)    Insomnia    Nephrolithiasis    Osteoporosis    Phimosis    Seborrheic dermatitis     Past Surgical History:  Procedure Laterality Date   arm fracture     circumscision       reports that he has quit smoking. His smoking use included pipe. He has never used smokeless tobacco. He reports that he does not drink alcohol or use drugs.  No Known Allergies  Family History  Problem Relation Age of Onset   Kidney disease Neg Hx     Prior to Admission medications   Medication Sig Start Date End Date Taking? Authorizing Provider  acetaminophen (ACETAMINOPHEN EXTRA STRENGTH) 500 MG tablet TAKE 2 TABLETS BY MOUTH 3 TIMES DAILY FOR PAIN Patient taking differently: Take 1,000 mg by mouth 3 (three) times daily. TAKE 2 TABLETS BY MOUTH 3 TIMES DAILY FOR PAIN 10/27/18   Alba CorySowles, Krichna, MD  AMITIZA 24 MCG capsule TAKE 1 CAPSULE BY MOUTH 2 TIMES PER DAY WITH MEAL Patient taking differently: Take 24 mcg by mouth 2 (two) times daily with a meal.  04/28/19   Sowles, Danna HeftyKrichna, MD  ASPIRIN LOW DOSE 81 MG EC tablet TAKE  1 TABLET BY MOUTH ONCE EVERY DAY FOR CIRCULATION Patient taking differently: Take 81 mg by mouth daily.  04/28/19   Alba CorySowles, Krichna, MD  D3-1000 25 MCG (1000 UT) tablet TAKE 1 TABLET BY MOUTH ONCE DAILY FOR SUPPLEMENT. Patient taking differently: Take 1,000 Units by mouth daily.  06/29/19   Alba CorySowles, Krichna, MD  DULoxetine (CYMBALTA) 60 MG capsule Take 120 mg by mouth daily.  03/30/19   [provider]  esomeprazole (NEXIUM) 20 MG capsule TAKE 1 CAPSULE BY MOUTH AT LEAST 30 MINUTES BEFORE FOOD, ONCE DAILY FOR REFLUX Patient taking differently: Take 20 mg by mouth daily.  04/28/19   Alba CorySowles, Krichna, MD  feeding supplement (BOOST HIGH PROTEIN) LIQD Take 237 mLs by mouth 3 (three) times daily between meals. 04/09/19   Alba CorySowles, Krichna, MD  gabapentin (NEURONTIN) 100 MG capsule Take 200 mg by mouth 3 (three) times daily.    [provider]  ketoconazole (NIZORAL) 2 % cream Apply topically daily. Patient not taking: Reported on 09/21/2019 11/21/18   Alba CorySowles, Krichna, MD  ketoconazole (NIZORAL) 2 % shampoo SHAMPOO HAIR 3 TIMES WEEKLY FOR FUNGAL INFECTION Patient not taking: Reported on 09/21/2019 11/21/18   Alba CorySowles, Krichna, MD  mometasone (ELOCON) 0.1 % lotion 4 drops 2 (two) times a week. (affected ear/s)  [provider]  orphenadrine (NORFLEX) 100 MG tablet Take 1 tablet (100 mg total) by mouth 2 (two) times daily. Patient taking differently: Take 100 mg by mouth 2 (two) times daily as needed for muscle spasms.  08/06/19   Steele Sizer, MD  QUEtiapine (SEROQUEL XR) 200 MG 24 hr tablet Take 200 mg by mouth daily in the afternoon.  03/30/19   [provider]  REFRESH LIQUIGEL 1 % GEL INSTILL 1 DROP INTO EACH EYE 3 TIMES A DAY Patient taking differently: Place 1 drop into both eyes 3 (three) times daily.  06/26/19   Steele Sizer, MD  tamsulosin (FLOMAX) 0.4 MG CAPS capsule Take 1 capsule (0.4 mg total) by mouth daily. 08/05/19   McGowan, Larene Beach A, PA-C  traZODone (DESYREL)  50 MG tablet TAKE 1 TABLET BY MOUTH EACH DAY AT BEDTIME FOR SLEEP Patient taking differently: Take 50 mg by mouth at bedtime.  08/26/19   Steele Sizer, MD    Physical Exam: Vitals:   09/21/19 2050  SpO2: 90%      Constitutional: agitated Vitals:   09/21/19 2050  SpO2: 90%   Eyes: PERRL, lids and conjunctivae normal ENMT: Mucous membranes are moist. Posterior pharynx clear of any exudate or lesions.Normal dentition.  Neck: normal, supple, no masses, no thyromegaly Respiratory: clear to auscultation bilaterally, no wheezing, no crackles. Normal respiratory effort. No accessory muscle use.  Cardiovascular: Regular rate and rhythm, no murmurs / rubs / gallops. No extremity edema. 2+ pedal pulses. No carotid bruits.  Abdomen: no tenderness, no masses palpated. No hepatosplenomegaly. Bowel sounds positive.  Musculoskeletal: no clubbing / cyanosis. No joint deformity upper and lower extremities. Good ROM, no contractures. Normal muscle tone.  Skin: no rashes, lesions, ulcers. No induration Neurologic: CN 2-12 grossly intact.  Psychiatric: agitated   Labs on Admission: I have personally reviewed following labs and imaging studies  CBC: Recent Labs  Lab 09/21/19 0735  WBC 4.5  NEUTROABS 3.6  HGB 11.5*  HCT 34.8*  MCV 93.0  PLT 756   Basic Metabolic Panel: Recent Labs  Lab 09/21/19 0735  NA 141  K 4.0  CL 102  CO2 24  GLUCOSE 96  BUN 27*  CREATININE 1.00  CALCIUM 8.7*   GFR: Estimated Creatinine Clearance: 51.4 mL/min (by C-G formula based on SCr of 1 mg/dL). Liver Function Tests: Recent Labs  Lab 09/21/19 0735  AST 71*  ALT 27  ALKPHOS 48  BILITOT 0.9  PROT 7.0  ALBUMIN 3.6   No results for input(s): LIPASE, AMYLASE in the last 168 hours. No results for input(s): AMMONIA in the last 168 hours. Coagulation Profile: No results for input(s): INR, PROTIME in the last 168 hours. Cardiac Enzymes: No results for input(s): CKTOTAL, CKMB, CKMBINDEX, TROPONINI  in the last 168 hours. BNP (last 3 results) No results for input(s): PROBNP in the last 8760 hours. HbA1C: No results for input(s): HGBA1C in the last 72 hours. CBG: No results for input(s): GLUCAP in the last 168 hours. Lipid Profile: Recent Labs    09/21/19 0735  TRIG 94   Thyroid Function Tests: No results for input(s): TSH, T4TOTAL, FREET4, T3FREE, THYROIDAB in the last 72 hours. Anemia Panel: Recent Labs    09/21/19 0735  FERRITIN 518*   Urine analysis:    Component Value Date/Time   COLORURINE Amber 06/20/2013 1606   APPEARANCEUR Hazy 06/20/2013 1606   LABSPEC 1.039 06/20/2013 1606   PHURINE 5.0 06/20/2013 1606   GLUCOSEU Negative 06/20/2013 1606   HGBUR  1+ 06/20/2013 1606   BILIRUBINUR neg 08/14/2019 1556   BILIRUBINUR 2+ 06/20/2013 1606   KETONESUR 1+ 06/20/2013 1606   PROTEINUR Negative 08/14/2019 1556   PROTEINUR >=500 06/20/2013 1606   UROBILINOGEN 0.2 08/14/2019 1556   NITRITE neg 08/14/2019 1556   NITRITE Negative 06/20/2013 1606   LEUKOCYTESUR Negative 08/14/2019 1556   LEUKOCYTESUR Negative 06/20/2013 1606   Sepsis Labs: !!!!!!!!!!!!!!!!!!!!!!!!!!!!!!!!!!!!!!!!!!!! (procalcitonin:4,lacticidven:4) ) Recent Results (from the past 240 hour(s))  Blood Culture (routine x 2)     Status: None (Preliminary result)   Collection Time: 09/21/19  7:35 AM   Specimen: BLOOD  Result Value Ref Range Status   Specimen Description BLOOD RIGHT FA  Final   Special Requests   Final    BOTTLES DRAWN AEROBIC AND ANAEROBIC Blood Culture adequate volume   Culture   Final    NO GROWTH <12 HOURS Performed at [facility], [address] [place], [place] [address]    Report Status PENDING  Incomplete  Blood Culture (routine x 2)     Status: None (Preliminary result)   Collection Time: 09/21/19  7:35 AM   Specimen: BLOOD  Result Value Ref Range Status   Specimen Description BLOOD RIGHT FA  Final   Special Requests   Final    BOTTLES DRAWN AEROBIC  AND ANAEROBIC Blood Culture results may not be optimal due to an excessive volume of blood received in culture bottles   Culture   Final    NO GROWTH <12 HOURS Performed at [facility], [address], [place], [place] [address]    Report Status PENDING  Incomplete  SARS Coronavirus 2 by RT PCR (hospital order, performed in [facility] hospital lab) Nasopharyngeal Nasopharyngeal Swab     Status: Abnormal   Collection Time: 09/21/19  7:35 AM   Specimen: Nasopharyngeal Swab  Result Value Ref Range Status   SARS Coronavirus 2 POSITIVE (A) NEGATIVE Final    Comment: RESULT CALLED TO, READ BACK BY AND VERIFIED WITH:  Theophilus Bones AT 0849 09/21/2019 SDR (NOTE) If result is NEGATIVE SARS-CoV-2 target nucleic acids are NOT DETECTED. The SARS-CoV-2 RNA is generally detectable in upper and lower  respiratory specimens during the acute phase of infection. The lowest  concentration of SARS-CoV-2 viral copies this assay can detect is 250  copies / mL. A negative result does not preclude SARS-CoV-2 infection  and should not be used as the sole basis for treatment or other  patient management decisions.  A negative result may occur with  improper specimen collection / handling, submission of specimen other  than nasopharyngeal swab, presence of viral mutation(s) within the  areas targeted by this assay, and inadequate number of viral copies  (<250 copies / mL). A negative result must be combined with clinical  observations, patient history, and epidemiological information. If result is POSITIVE SARS-CoV-2 target nucleic acids are DETECTED. The  SARS-CoV-2 RNA is generally detectable in upper and lower  respiratory specimens during the acute phase of infection.  Positive  results are indicative of active infection with SARS-CoV-2.  Clinical  correlation with patient history and other diagnostic information is  necessary to determine patient infection status.  Positive results do  not rule  out bacterial infection or co-infection with other viruses. If result is PRESUMPTIVE POSTIVE SARS-CoV-2 nucleic acids MAY BE PRESENT.   A presumptive positive result was obtained on the submitted specimen  and confirmed on repeat testing.  While 2019 novel coronavirus  (SARS-CoV-2) nucleic acids may be  present in the submitted sample  additional confirmatory testing may be necessary for epidemiological  and / or clinical management purposes  to differentiate between  SARS-CoV-2 and other Sarbecovirus currently known to infect humans.  If clinically indicated additional testing with an alternate test  methodology [id number]) is a dvised. The SARS-CoV-2 RNA is generally  detectable in upper and lower respiratory specimens during the acute  phase of infection. The expected result is Negative. Fact Sheet for Patients:  BoilerBrush.com.cy Fact Sheet for Healthcare Providers: https://pope.com/ This test is not yet approved or cleared by the [place] FDA and has been authorized for detection and/or diagnosis of SARS-CoV-2 by FDA under an Emergency Use Authorization (EUA).  This EUA will remain in effect (meaning this test can be used) for the duration of the COVID-19 declaration under Section 564(b)(1) of the Act, 21 U.S.C. section 360bbb-3(b)(1), unless the authorization is terminated or revoked sooner. Performed at [facility], [address] [place], [place] [address]      Radiological Exams on Admission: Dg Lumbar Spine 2-3 Views  Result Date: 09/21/2019 CLINICAL DATA:  Low back pain EXAM: LUMBAR SPINE - 2-3 VIEW COMPARISON:  CT abdomen 06/23/2013 FINDINGS: There is a chronic L1 vertebral body compression fracture unchanged compared with 06/23/2013. No other acute fracture. Alignment is normal. Degenerative disease with disc height loss throughout the lumbar spine. Bilateral facet arthropathy at L4-5 and L5-S1. Incidental  note made of excreted IV contrast material within the renal collecting system from CT angiogram of the chest performed earlier same day. IMPRESSION: 1.  No acute osseous injury of the lumbar spine. 2. Lumbar spine spondylosis as described above. Electronically Signed   By: Elige Ko   On: 09/21/2019 11:10   Ct Angio Chest Pe W And/or Wo Contrast  Result Date: 09/21/2019 CLINICAL DATA:  Shortness of breath. EXAM: CT ANGIOGRAPHY CHEST WITH CONTRAST TECHNIQUE: Multidetector CT imaging of the chest was performed using the standard protocol during bolus administration of intravenous contrast. Multiplanar CT image reconstructions and MIPs were obtained to evaluate the vascular anatomy. CONTRAST:  58mL OMNIPAQUE IOHEXOL 350 MG/ML SOLN COMPARISON:  None. FINDINGS: Cardiovascular: Satisfactory opacification of the pulmonary arteries to the segmental level. No evidence of pulmonary embolism. Normal heart size. No pericardial effusion. Mediastinum/Nodes: No enlarged mediastinal, hilar, or axillary lymph nodes. Thyroid gland, trachea, and esophagus demonstrate no significant findings. Lungs/Pleura: No pneumothorax or pleural effusion is noted. Multiple airspace opacities are noted in both lungs, right greater than left. This is concerning for multifocal pneumonia. 17 x 11 mm subpleural mass is noted posteriorly in the left lung apex concerning for possible neoplasm. Upper Abdomen: No acute abnormality. Musculoskeletal: No chest wall abnormality. No acute or significant osseous findings. Review of the MIP images confirms the above findings. IMPRESSION: 17 x 11 mm subpleural mass is noted posteriorly in the left lung apex concerning for possible neoplasm; PET scan is recommended for further evaluation. No definite evidence of pulmonary embolus. Multiple airspace opacities are noted bilaterally, right greater than left, consistent with multifocal pneumonia. Aortic Atherosclerosis (ICD10-I70.0). Electronically Signed   By:  Lupita Raider M.D.   On: 09/21/2019 09:52   Old chart reviewed   Assessment/Plan 73 yo male with covid pna and hypoxia  Principal Problem:   Pneumonia due to COVID-19 virus- ordered decadron and remdisivir iv.  Vit c/zinc along with lovenox.  Wean oxygen as tolerates.   Active Problems:   Acute respiratory failure due to COVID-19 [facility])- as above  Chronic pain- clarify home meds and resume   Intellectual disability- noted   Adult behavior problem- prn ativan ordered, sitter ordered with nursing staff.   Pt seen before midnight  DVT prophylaxis:  lovenox Code Status:  DNR Family Communiction:  none Disposition Plan:  days Consults called:  none Admission status: admission   Zara Wendt A MD [facility]  If 7PM-7AM, please contact night-coverage [email or web address] Password [facility]  09/21/2019, 9:23 PM

## 2019-09-21 NOTE — ED Notes (Signed)
Pt cleansed of incontinent urine and full bed changed as well as new brief applied. PT clothes placed it pt belonging bag at bedside.

## 2019-09-21 NOTE — ED Notes (Signed)
Called pharmacy for ordered ativan, will give upon receiving and let radiology know so they can xray patient.

## 2019-09-21 NOTE — Progress Notes (Signed)
Remdesivir - Pharmacy Brief Note   O:  ALT: 27 CXR: multifocal pneumonia SpO2: 86% on 2-3 L   A/P:  Remdesivir 200 mg IVPB once followed by 100 mg IVPB daily x 4 days.   Chinita Greenland PharmD Clinical Pharmacist 09/21/2019 10:51 AM

## 2019-09-21 NOTE — ED Notes (Signed)
Report received. [facility] enroute to pick up pt.Pt refusing to wear any monitoring devices and removes oxygen.

## 2019-09-21 NOTE — ED Notes (Signed)
Pt resting when not being interacted with, agitated with staff present

## 2019-09-21 NOTE — ED Notes (Signed)
Patient transported to CT 

## 2019-09-21 NOTE — Plan of Care (Signed)
Patient admitted this shift. Unable to obtain health history, will have day shift call back to group home.

## 2019-09-21 NOTE — ED Notes (Signed)
Medical necessity filled out by this RN and given to ED Secretary in preparation for transfer to [facility]

## 2019-09-21 NOTE — ED Notes (Signed)
Pt continues to be agitated, restless, will not follow commands currently.

## 2019-09-21 NOTE — ED Triage Notes (Signed)
Arrives to ER via [facility] from group home c/o AMS since 4AM this AM. Pt nonverbal at baseline, usually uses sign language. Unable to use sign language at this time even with interpreter attempt at time of arrival. Pt 102.7 temp, 87 CBG, 87% on RA placed on NRB up to 99% at time of arrival, 135/84, 90HR. Pt from [place] [facility] grou home, [address]   Pt unable to follow commands, moving around in bed and unable to sit still.  Pt COVID positive.

## 2019-09-21 NOTE — ED Notes (Signed)
Used [employer] interpreter services to attempt to use sign language to communicate with patient, as that is what was told pt uses in EMS report from group home. Unable to successfully communicate with patient due to interpreter being unable to engage patient in successful sign language. Pt aggitated, restless, speaking loudly but unable to understand words. EDP at bedside.

## 2019-09-21 NOTE — ED Notes (Signed)
Pt now wearing oxygen via [place]. Pt intermittently pulling off but saturations staying above 92% on RA

## 2019-09-21 NOTE — ED Notes (Signed)
Pt unable to sit still for xray. Verbal orders received

## 2019-09-21 NOTE — ED Notes (Signed)
Sister notified of pt transfer. Given room number and telephone number. Number to group home at [facility] [address] is [phone] to call and get pt eye glasses.

## 2019-09-21 NOTE — ED Notes (Signed)
Date and time results received: 09/21/19 8:43 AM (use smartphrase ".now" to insert current time)  Test: lactic Critical Value: 2.0  Name of Provider Notified: Dr. Jimmye Norman

## 2019-09-21 NOTE — Progress Notes (Signed)
Aaron Foster, is a 73 y.o. male, DOB - 1946/03/27, LOV:[id number]  With history of mental retardation is in a group home, he is nonverbal at baseline and uses sign language, also has GERD, depression and anxiety who was diagnosed with COVID-19 about 4 days ago was brought in to [facility] ER with fever and mild shortness of breath.  Diagnosed with COVID-19 pneumonitis.  CT scan also suspicious for a lung mass.  Placed on 2 L oxygen, IV steroids along with remdesivir and accepted to [facility] on telemetry.  Sister is power of attorney and ER MD will confirm the CODE STATUS prior to transfer.    Vitals:   09/21/19 0915 09/21/19 1000 09/21/19 1028 09/21/19 1029  BP:  129/70    Pulse: 83 78 77 73  Resp: (!) 27 19 17  (!) 21  Temp:      TempSrc:      SpO2: 96% 100% (!) 86% 95%  Weight:      Height:            Data Review   Micro Results Recent Results (from the past 240 hour(s))  Blood Culture (routine x 2)     Status: None (Preliminary result)   Collection Time: 09/21/19  7:35 AM   Specimen: BLOOD  Result Value Ref Range Status   Specimen Description BLOOD RIGHT FA  Final   Special Requests   Final    BOTTLES DRAWN AEROBIC AND ANAEROBIC Blood Culture adequate volume   Culture   Final    NO GROWTH <12 HOURS Performed at [facility], [address] [place], [place]    Report Status PENDING  Incomplete  Blood Culture (routine x 2)     Status: None (Preliminary result)   Collection Time: 09/21/19  7:35 AM   Specimen: BLOOD  Result Value Ref Range Status   Specimen Description BLOOD RIGHT FA  Final   Special Requests   Final    BOTTLES DRAWN AEROBIC AND ANAEROBIC Blood Culture results may not be optimal due to an excessive volume of blood received in culture bottles   Culture   Final    NO GROWTH <12 HOURS Performed at [facility], [address], [place], [place]    Report Status PENDING  Incomplete  SARS Coronavirus 2 by RT PCR (hospital order, performed in [facility] hospital lab) Nasopharyngeal Nasopharyngeal Swab     Status: Abnormal   Collection Time: 09/21/19  7:35 AM   Specimen: Nasopharyngeal Swab  Result Value Ref Range Status   SARS Coronavirus 2 POSITIVE (A) NEGATIVE Final    Comment: RESULT CALLED TO, READ BACK BY AND VERIFIED WITH:  09/23/19 AT 0849 09/21/2019 SDR (NOTE) If result is NEGATIVE SARS-CoV-2 target nucleic acids are NOT DETECTED. The SARS-CoV-2 RNA is generally detectable in upper and lower  respiratory specimens during the acute phase of infection. The lowest  concentration of SARS-CoV-2 viral copies this assay can detect is 250  copies / mL. A negative result does not preclude SARS-CoV-2 infection  and should not be used as the sole basis for treatment or other  patient management decisions.  A negative result may occur with  improper specimen collection / handling, submission of specimen other  than nasopharyngeal swab, presence of viral mutation(s) within the  areas targeted by this assay, and inadequate number of viral copies  (<250 copies / mL). A negative result must be combined with clinical  observations, patient history, and epidemiological  information. If result is POSITIVE SARS-CoV-2 target nucleic acids are DETECTED. The  SARS-CoV-2 RNA is generally detectable in upper and lower  respiratory specimens during the acute phase of infection.  Positive  results are indicative of active infection with SARS-CoV-2.  Clinical  correlation with patient history and other diagnostic information is  necessary to determine patient infection status.  Positive results do  not rule out bacterial infection or co-infection with other viruses. If result is PRESUMPTIVE POSTIVE SARS-CoV-2 nucleic acids MAY BE PRESENT.   A presumptive positive result was obtained on the submitted specimen  and confirmed on repeat  testing.  While 2019 novel coronavirus  (SARS-CoV-2) nucleic acids may be present in the submitted sample  additional confirmatory testing may be necessary for epidemiological  and / or clinical management purposes  to differentiate between  SARS-CoV-2 and other Sarbecovirus currently known to infect humans.  If clinically indicated additional testing with an alternate test  methodology [phone] is a dvised. The SARS-CoV-2 RNA is generally  detectable in upper and lower respiratory specimens during the acute  phase of infection. The expected result is Negative. Fact Sheet for Patients:  BoilerBrush.com.cyhttps://www.fda.gov/media/136312/download Fact Sheet for Healthcare Providers: https://pope.com/https://www.fda.gov/media/136313/download This test is not yet approved or cleared by the [place] FDA and has been authorized for detection and/or diagnosis of SARS-CoV-2 by FDA under an Emergency Use Authorization (EUA).  This EUA will remain in effect (meaning this test can be used) for the duration of the COVID-19 declaration under Section 564(b)(1) of the Act, 21 U.S.C. section 360bbb-3(b)(1), unless the authorization is terminated or revoked sooner. Performed at [facility], [address] [place], [place] [id number]     Radiology Reports Ct Angio Chest Pe W And/or Wo Contrast  Result Date: 09/21/2019 CLINICAL DATA:  Shortness of breath. EXAM: CT ANGIOGRAPHY CHEST WITH CONTRAST TECHNIQUE: Multidetector CT imaging of the chest was performed using the standard protocol during bolus administration of intravenous contrast. Multiplanar CT image reconstructions and MIPs were obtained to evaluate the vascular anatomy. CONTRAST:  75mL OMNIPAQUE IOHEXOL 350 MG/ML SOLN COMPARISON:  None. FINDINGS: Cardiovascular: Satisfactory opacification of the pulmonary arteries to the segmental level. No evidence of pulmonary embolism. Normal heart size. No pericardial effusion. Mediastinum/Nodes: No enlarged mediastinal,  hilar, or axillary lymph nodes. Thyroid gland, trachea, and esophagus demonstrate no significant findings. Lungs/Pleura: No pneumothorax or pleural effusion is noted. Multiple airspace opacities are noted in both lungs, right greater than left. This is concerning for multifocal pneumonia. 17 x 11 mm subpleural mass is noted posteriorly in the left lung apex concerning for possible neoplasm. Upper Abdomen: No acute abnormality. Musculoskeletal: No chest wall abnormality. No acute or significant osseous findings. Review of the MIP images confirms the above findings. IMPRESSION: 17 x 11 mm subpleural mass is noted posteriorly in the left lung apex concerning for possible neoplasm; PET scan is recommended for further evaluation. No definite evidence of pulmonary embolus. Multiple airspace opacities are noted bilaterally, right greater than left, consistent with multifocal pneumonia. Aortic Atherosclerosis (ICD10-I70.0). Electronically Signed   By: Lupita RaiderJames  Green Jr M.D.   On: 09/21/2019 09:52    CBC Recent Labs  Lab 09/21/19 0735  WBC 4.5  HGB 11.5*  HCT 34.8*  PLT 216  MCV 93.0  MCH 30.7  MCHC 33.0  RDW 12.4  LYMPHSABS 0.5*  MONOABS 0.3  EOSABS 0.0  BASOSABS 0.0    Chemistries  Recent Labs  Lab 09/21/19 0735  NA 141  K 4.0  CL 102  CO2 24  GLUCOSE 96  BUN 27*  CREATININE 1.00  CALCIUM 8.7*  AST 71*  ALT 27  ALKPHOS 48  BILITOT 0.9   ------------------------------------------------------------------------------------------------------------------ estimated creatinine clearance is 51.4 mL/min (by C-G formula based on SCr of 1 mg/dL). ------------------------------------------------------------------------------------------------------------------ No results for input(s): HGBA1C in the last 72 hours. ------------------------------------------------------------------------------------------------------------------ Recent Labs    09/21/19 0735  TRIG 94    ------------------------------------------------------------------------------------------------------------------ No results for input(s): TSH, T4TOTAL, T3FREE, THYROIDAB in the last 72 hours.  Invalid input(s): FREET3 ------------------------------------------------------------------------------------------------------------------ Recent Labs    09/21/19 0735  FERRITIN 518*    Coagulation profile No results for input(s): INR, PROTIME in the last 168 hours.  No results for input(s): DDIMER in the last 72 hours.  Cardiac Enzymes No results for input(s): CKMB, TROPONINI, MYOGLOBIN in the last 168 hours.  Invalid input(s): CK ------------------------------------------------------------------------------------------------------------------ Invalid input(s): POCBNP  Signature  Lala Lund M.D on 09/21/2019 at 10:44 AM   -  To page go to [email or web address]

## 2019-09-22 LAB — COMPREHENSIVE METABOLIC PANEL
ALT: 38 U/L (ref 0–44)
AST: 90 U/L — ABNORMAL HIGH (ref 15–41)
Albumin: 3.7 g/dL (ref 3.5–5.0)
Alkaline Phosphatase: 52 U/L (ref 38–126)
Anion gap: 11 (ref 5–15)
BUN: 32 mg/dL — ABNORMAL HIGH (ref 8–23)
CO2: 28 mmol/L (ref 22–32)
Calcium: 8.5 mg/dL — ABNORMAL LOW (ref 8.9–10.3)
Chloride: 105 mmol/L (ref 98–111)
Creatinine, Ser: 0.91 mg/dL (ref 0.61–1.24)
GFR calc Af Amer: >60 mL/min (ref >60)
GFR calc non Af Amer: >60 mL/min (ref >60)
Glucose, Bld: 127 mg/dL — ABNORMAL HIGH (ref 70–99)
Potassium: 4.4 mmol/L (ref 3.5–5.1)
Sodium: 144 mmol/L (ref 135–145)
Total Bilirubin: 1 mg/dL (ref 0.3–1.2)
Total Protein: 7.3 g/dL (ref 6.5–8.1)

## 2019-09-22 LAB — CBC WITH DIFFERENTIAL/PLATELET
Abs Immature Granulocytes: 0.03 10*3/uL (ref 0.00–0.07)
Basophils Absolute: 0 10*3/uL (ref 0.0–0.1)
Basophils Relative: 0 %
Eosinophils Absolute: 0 10*3/uL (ref 0.0–0.5)
Eosinophils Relative: 0 %
HCT: 37.8 % — ABNORMAL LOW (ref 39.0–52.0)
Hemoglobin: 12.3 g/dL — ABNORMAL LOW (ref 13.0–17.0)
Immature Granulocytes: 1 %
Lymphocytes Relative: 14 %
Lymphs Abs: 0.7 10*3/uL (ref 0.7–4.0)
MCH: 31.1 pg (ref 26.0–34.0)
MCHC: 32.5 g/dL (ref 30.0–36.0)
MCV: 95.7 fL (ref 80.0–100.0)
Monocytes Absolute: 0.4 10*3/uL (ref 0.1–1.0)
Monocytes Relative: 7 %
Neutro Abs: 3.9 10*3/uL (ref 1.7–7.7)
Neutrophils Relative %: 78 %
Platelets: 251 10*3/uL (ref 150–400)
RBC: 3.95 MIL/uL — ABNORMAL LOW (ref 4.22–5.81)
RDW: 12.3 % (ref 11.5–15.5)
WBC: 5 10*3/uL (ref 4.0–10.5)
nRBC: 0 % (ref 0.0–0.2)

## 2019-09-22 LAB — C-REACTIVE PROTEIN: CRP: 17.6 mg/dL — ABNORMAL HIGH (ref <1.0)

## 2019-09-22 LAB — ABO/RH: ABO/RH(D): A POS

## 2019-09-22 LAB — MAGNESIUM: Magnesium: 2.2 mg/dL (ref 1.7–2.4)

## 2019-09-22 LAB — D-DIMER, QUANTITATIVE: D-Dimer, Quant: 1.7 ug/mL-FEU — ABNORMAL HIGH (ref 0.00–0.50)

## 2019-09-22 LAB — BRAIN NATRIURETIC PEPTIDE: B Natriuretic Peptide: 137.9 pg/mL — ABNORMAL HIGH (ref 0.0–100.0)

## 2019-09-22 MED ORDER — GABAPENTIN 100 MG PO CAPS
200.0000 mg | ORAL_CAPSULE | Freq: Three times a day (TID) | ORAL | Status: DC
  Administered 2019-09-22 – 2019-09-28 (×19): 200 mg via ORAL
  Filled 2019-09-22 (×19): qty 2

## 2019-09-22 MED ORDER — VITAMIN D3 25 MCG (1000 UNIT) PO TABS
1000.0000 [IU] | ORAL_TABLET | Freq: Every day | ORAL | Status: DC
  Administered 2019-09-22 – 2019-09-28 (×7): 1000 [IU] via ORAL
  Filled 2019-09-22 (×13): qty 1

## 2019-09-22 MED ORDER — PANTOPRAZOLE SODIUM 40 MG PO TBEC
40.0000 mg | DELAYED_RELEASE_TABLET | Freq: Every day | ORAL | Status: DC
  Administered 2019-09-23 – 2019-09-28 (×6): 40 mg via ORAL
  Filled 2019-09-22 (×6): qty 1

## 2019-09-22 MED ORDER — LUBIPROSTONE 24 MCG PO CAPS
24.0000 ug | ORAL_CAPSULE | Freq: Two times a day (BID) | ORAL | Status: DC
  Administered 2019-09-23 – 2019-09-28 (×12): 24 ug via ORAL
  Filled 2019-09-22 (×14): qty 1

## 2019-09-22 MED ORDER — ENSURE MAX PROTEIN PO LIQD
11.0000 [oz_av] | Freq: Two times a day (BID) | ORAL | Status: DC
  Administered 2019-09-22 – 2019-09-27 (×10): 11 [oz_av] via ORAL
  Filled 2019-09-22 (×14): qty 330

## 2019-09-22 MED ORDER — TAMSULOSIN HCL 0.4 MG PO CAPS
0.4000 mg | ORAL_CAPSULE | Freq: Every day | ORAL | Status: DC
  Administered 2019-09-23 – 2019-09-28 (×6): 0.4 mg via ORAL
  Filled 2019-09-22 (×6): qty 1

## 2019-09-22 MED ORDER — ORPHENADRINE CITRATE ER 100 MG PO TB12: 100.0000 mg | ORAL_TABLET | Freq: Two times a day (BID) | ORAL | Status: DC

## 2019-09-22 MED ORDER — ENOXAPARIN SODIUM 40 MG/0.4ML ~~LOC~~ SOLN
40.0000 mg | SUBCUTANEOUS | Status: DC
  Administered 2019-09-23 – 2019-09-28 (×6): 40 mg via SUBCUTANEOUS
  Filled 2019-09-22 (×6): qty 0.4

## 2019-09-22 MED ORDER — CYCLOBENZAPRINE HCL 5 MG PO TABS
5.0000 mg | ORAL_TABLET | Freq: Three times a day (TID) | ORAL | Status: DC | PRN
  Administered 2019-09-22 – 2019-09-26 (×6): 5 mg via ORAL
  Filled 2019-09-22 (×8): qty 1

## 2019-09-22 MED ORDER — HALOPERIDOL LACTATE 5 MG/ML IJ SOLN
2.0000 mg | Freq: Four times a day (QID) | INTRAMUSCULAR | Status: DC | PRN
  Administered 2019-09-22 – 2019-09-24 (×4): 2 mg via INTRAVENOUS
  Filled 2019-09-22 (×5): qty 1

## 2019-09-22 MED ORDER — QUETIAPINE FUMARATE ER 200 MG PO TB24
200.0000 mg | ORAL_TABLET | Freq: Every day | ORAL | Status: DC
  Administered 2019-09-23 – 2019-09-28 (×6): 200 mg via ORAL
  Filled 2019-09-22 (×7): qty 1

## 2019-09-22 MED ORDER — TRAZODONE HCL 50 MG PO TABS
50.0000 mg | ORAL_TABLET | Freq: Every day | ORAL | Status: DC
  Administered 2019-09-22 – 2019-09-27 (×6): 50 mg via ORAL
  Filled 2019-09-22 (×6): qty 1

## 2019-09-22 MED ORDER — DULOXETINE HCL 60 MG PO CPEP
120.0000 mg | ORAL_CAPSULE | Freq: Every day | ORAL | Status: DC
  Administered 2019-09-22 – 2019-09-28 (×7): 120 mg via ORAL
  Filled 2019-09-22 (×8): qty 2

## 2019-09-22 MED ORDER — BOOST HIGH PROTEIN PO LIQD
1.0000 | Freq: Three times a day (TID) | ORAL | Status: DC
  Filled 2019-09-22 (×4): qty 237

## 2019-09-22 NOTE — Progress Notes (Signed)
PROGRESS NOTE                                                                                                                                                                                                             Patient Demographics:    Aaron Foster, is a 73 y.o. male, DOB - 05/17/1946, XLK:[id number]  Outpatient Primary MD for the patient is Steele Sizer, MD    LOS - 1  Admit date - 09/21/2019    CC - Fever     Brief Narrative  Aaron Foster is a 73 y.o. male with medical history significant of mental retardation nonverbal, gerd, depression/anxiety dx with covid 4 days ago sent to ed for worsening sob and fever.  Pt cannot provide any history and is agitated.  Given several doses of ativan.  Ct scan suspect for lung mass but also with pna.  On 2 liters with good o2 sats.  Given decadron and remdisivir.  Referred for admisssion for covid pna.   Subjective:    Aaron Foster today is in bed agitated and confused unable to answer questions or follow commands.   Assessment  & Plan :    1. Acute Hypoxic Resp. Failure due to Acute Covid 19 Viral Pneumonitis during the ongoing 2020 Covid 19 Pandemic - moderate disease, currently stable on 2 L nasal cannula oxygen has been started on IV steroid and remdesivir.  Continue to monitor clinically along with inflammatory markers closely.   SpO2: 97 % O2 Flow Rate (L/min): 2 L/min  Hepatic Function Latest Ref Rng & Units 09/22/2019 09/21/2019 08/06/2019  Total Protein 6.5 - 8.1 g/dL 7.3 7.0 6.7  Albumin 3.5 - 5.0 g/dL 3.7 3.6 -  AST 15 - 41 U/L 90(H) 71(H) 17  ALT 0 - 44 U/L 38 27 13  Alk Phosphatase 38 - 126 U/L 52 48 -  Total Bilirubin 0.3 - 1.2 mg/dL 1.0 0.9 0.6    COVID-19 Labs  Recent Labs    09/21/19 0735 09/22/19 1010  FERRITIN 518*  --   LDH 524*  --   CRP 17.5* 17.6*    Lab Results  Component Value Date   SARSCOV2NAA POSITIVE (A) 09/21/2019       Component Value Date/Time   BNP 137.9 (H) 09/22/2019 1010    2.  Lung mass.  Noted incidentally  on CT scan.  17 x 11 mm subpleural mass is noted posteriorly in the left lung apex concerning for possible neoplasm - outpatient age-appropriate PCP directed work-up.  I doubt he is a candidate for extensive work-up or intervention.  3.  Mental retardation.  Toxic encephalopathy on top of mental retardation.  Supportive care.  Home medications continued.  As needed Haldol added.    I had detailed discussions with patient's POA his sister and on 09/22/2019 at 11:50 AM.  No feeding tube, no extreme measures. DNR.  Gentle medical treatment if declines focus on comfort.     Condition - Extremely Guarded  Family Communication  : sister Aaron Foster, 09/22/19  Code Status : DNR  Diet : Soft diet with feeding assistance and aspiration precautions.  Disposition Plan  :  SNF  Consults  :  None  Procedures  :     PUD Prophylaxis :    DVT Prophylaxis  :  Lovenox    Lab Results  Component Value Date   PLT 251 09/22/2019    Inpatient Medications  Scheduled Meds: . dexamethasone (DECADRON) injection  6 mg Intravenous Q24H  . enoxaparin (LOVENOX) injection  30 mg Subcutaneous Q12H  . sodium chloride flush  3 mL Intravenous Q12H  . vitamin C  500 mg Oral Daily  . zinc sulfate  220 mg Oral Daily   Continuous Infusions: . sodium chloride    . remdesivir 100 mg in NS 250 mL 100 mg (09/22/19 1141)   PRN Meds:.sodium chloride, acetaminophen, chlorpheniramine-HYDROcodone, guaiFENesin-dextromethorphan, haloperidol lactate, LORazepam, ondansetron **OR** ondansetron (ZOFRAN) IV, sodium chloride flush  Antibiotics  :    Anti-infectives (From admission, onward)   Start     Dose/Rate Route Frequency Ordered Stop   09/22/19 1000  remdesivir 100 mg in sodium chloride 0.9 % 250 mL IVPB     100 mg 500 mL/hr over 30 Minutes Intravenous Every 24 hours 09/21/19 2134 09/26/19 0959       Time Spent  in minutes  30   Lala Lund M.D on 09/22/2019 at 11:42 AM  To page go to [email or web address] - password [facility]  [facility] -  Office  [phone]     See all Orders from today for further details    Objective:   Vitals:   09/21/19 2200 09/22/19 0400 09/22/19 0424 09/22/19 0730  BP:    118/65  Pulse: 60 63  64  Resp:    20  Temp:    97.6 F (36.4 C)  TempSrc:    Axillary  SpO2: 99% 97%    Weight:   52.6 kg     Wt Readings from Last 3 Encounters:  09/22/19 52.6 kg  09/21/19 54.4 kg  08/06/19 51.4 kg     Intake/Output Summary (Last 24 hours) at 09/22/2019 1142 Last data filed at 09/22/2019 0426 Gross per 24 hour  Intake -  Output 1525 ml  Net -1525 ml     Physical Exam  Awake with agitated affect, unable to answer questions or follow commands but moving all 4 extremities by himself [place].AT,PERRAL Supple Neck,No JVD, No cervical lymphadenopathy appriciated.  Symmetrical Chest wall movement, Good air movement bilaterally, CTAB RRR,No Gallops,Rubs or new Murmurs, No Parasternal Heave +ve B.Sounds, Abd Soft, No tenderness, No organomegaly appriciated, No rebound - guarding or rigidity. No Cyanosis, Clubbing or edema, No new Rash or bruise       Data Review:    CBC Recent Labs  Lab 09/21/19 0735 09/22/19 1010  WBC  4.5 5.0  HGB 11.5* 12.3*  HCT 34.8* 37.8*  PLT 216 251  MCV 93.0 95.7  MCH 30.7 31.1  MCHC 33.0 32.5  RDW 12.4 12.3  LYMPHSABS 0.5* 0.7  MONOABS 0.3 0.4  EOSABS 0.0 0.0  BASOSABS 0.0 0.0    Chemistries  Recent Labs  Lab 09/21/19 0735 09/22/19 1010  NA 141 144  K 4.0 4.4  CL 102 105  CO2 24 28  GLUCOSE 96 127*  BUN 27* 32*  CREATININE 1.00 0.91  CALCIUM 8.7* 8.5*  MG  --  2.2  AST 71* 90*  ALT 27 38  ALKPHOS 48 52  BILITOT 0.9 1.0   ------------------------------------------------------------------------------------------------------------------ Recent Labs    09/21/19 0735  TRIG 94    No results found for:  HGBA1C ------------------------------------------------------------------------------------------------------------------ No results for input(s): TSH, T4TOTAL, T3FREE, THYROIDAB in the last 72 hours.  Invalid input(s): FREET3  Cardiac Enzymes No results for input(s): CKMB, TROPONINI, MYOGLOBIN in the last 168 hours.  Invalid input(s): CK ------------------------------------------------------------------------------------------------------------------    Component Value Date/Time   BNP 137.9 (H) 09/22/2019 1010    Micro Results Recent Results (from the past 240 hour(s))  Blood Culture (routine x 2)     Status: None (Preliminary result)   Collection Time: 09/21/19  7:35 AM   Specimen: BLOOD  Result Value Ref Range Status   Specimen Description BLOOD RIGHT FA  Final   Special Requests   Final    BOTTLES DRAWN AEROBIC AND ANAEROBIC Blood Culture adequate volume   Culture   Final    NO GROWTH < 24 HOURS Performed at [facility], [address], [place], [place] [address]    Report Status PENDING  Incomplete  Blood Culture (routine x 2)     Status: None (Preliminary result)   Collection Time: 09/21/19  7:35 AM   Specimen: BLOOD  Result Value Ref Range Status   Specimen Description BLOOD RIGHT FA  Final   Special Requests   Final    BOTTLES DRAWN AEROBIC AND ANAEROBIC Blood Culture results may not be optimal due to an excessive volume of blood received in culture bottles   Culture   Final    NO GROWTH < 24 HOURS Performed at [facility], [address] [place], [place] [address]    Report Status PENDING  Incomplete  SARS Coronavirus 2 by RT PCR (hospital order, performed in [place] hospital lab) Nasopharyngeal Nasopharyngeal Swab     Status: Abnormal   Collection Time: 09/21/19  7:35 AM   Specimen: Nasopharyngeal Swab  Result Value Ref Range Status   SARS Coronavirus 2 POSITIVE (A) NEGATIVE Final    Comment: RESULT CALLED TO, READ BACK BY AND  VERIFIED WITH:  Arlana Hove AT [id number] 09/21/2019 SDR (NOTE) If result is NEGATIVE SARS-CoV-2 target nucleic acids are NOT DETECTED. The SARS-CoV-2 RNA is generally detectable in upper and lower  respiratory specimens during the acute phase of infection. The lowest  concentration of SARS-CoV-2 viral copies this assay can detect is 250  copies / mL. A negative result does not preclude SARS-CoV-2 infection  and should not be used as the sole basis for treatment or other  patient management decisions.  A negative result may occur with  improper specimen collection / handling, submission of specimen other  than nasopharyngeal swab, presence of viral mutation(s) within the  areas targeted by this assay, and inadequate number of viral copies  (<250 copies / mL). A negative result must be combined with clinical  observations, patient history, and epidemiological information. If result is POSITIVE SARS-CoV-2 target nucleic acids are DETECTED. The  SARS-CoV-2 RNA is generally detectable in upper and lower  respiratory specimens during the acute phase of infection.  Positive  results are indicative of active infection with SARS-CoV-2.  Clinical  correlation with patient history and other diagnostic information is  necessary to determine patient infection status.  Positive results do  not rule out bacterial infection or co-infection with other viruses. If result is PRESUMPTIVE POSTIVE SARS-CoV-2 nucleic acids MAY BE PRESENT.   A presumptive positive result was obtained on the submitted specimen  and confirmed on repeat testing.  While 2019 novel coronavirus  (SARS-CoV-2) nucleic acids may be present in the submitted sample  additional confirmatory testing may be necessary for epidemiological  and / or clinical management purposes  to differentiate between  SARS-CoV-2 and other Sarbecovirus currently known to infect humans.  If clinically indicated additional testing with an alternate test   methodology [phone]) is a dvised. The SARS-CoV-2 RNA is generally  detectable in upper and lower respiratory specimens during the acute  phase of infection. The expected result is Negative. Fact Sheet for Patients:  StrictlyIdeas.no Fact Sheet for Healthcare Providers: BankingDealers.co.za This test is not yet approved or cleared by the [place] FDA and has been authorized for detection and/or diagnosis of SARS-CoV-2 by FDA under an Emergency Use Authorization (EUA).  This EUA will remain in effect (meaning this test can be used) for the duration of the COVID-19 declaration under Section 564(b)(1) of the Act, 21 U.S.C. section 360bbb-3(b)(1), unless the authorization is terminated or revoked sooner. Performed at [facility], [address] [place], [place] [address]     Radiology Reports Dg Lumbar Spine 2-3 Views  Result Date: 09/21/2019 CLINICAL DATA:  Low back pain EXAM: LUMBAR SPINE - 2-3 VIEW COMPARISON:  CT abdomen 06/23/2013 FINDINGS: There is a chronic L1 vertebral body compression fracture unchanged compared with 06/23/2013. No other acute fracture. Alignment is normal. Degenerative disease with disc height loss throughout the lumbar spine. Bilateral facet arthropathy at L4-5 and L5-S1. Incidental note made of excreted IV contrast material within the renal collecting system from CT angiogram of the chest performed earlier same day. IMPRESSION: 1.  No acute osseous injury of the lumbar spine. 2. Lumbar spine spondylosis as described above. Electronically Signed   By: Kathreen Devoid   On: 09/21/2019 11:10   Ct Angio Chest Pe W And/or Wo Contrast  Result Date: 09/21/2019 CLINICAL DATA:  Shortness of breath. EXAM: CT ANGIOGRAPHY CHEST WITH CONTRAST TECHNIQUE: Multidetector CT imaging of the chest was performed using the standard protocol during bolus administration of intravenous contrast. Multiplanar CT image  reconstructions and MIPs were obtained to evaluate the vascular anatomy. CONTRAST:  22m OMNIPAQUE IOHEXOL 350 MG/ML SOLN COMPARISON:  None. FINDINGS: Cardiovascular: Satisfactory opacification of the pulmonary arteries to the segmental level. No evidence of pulmonary embolism. Normal heart size. No pericardial effusion. Mediastinum/Nodes: No enlarged mediastinal, hilar, or axillary lymph nodes. Thyroid gland, trachea, and esophagus demonstrate no significant findings. Lungs/Pleura: No pneumothorax or pleural effusion is noted. Multiple airspace opacities are noted in both lungs, right greater than left. This is concerning for multifocal pneumonia. 17 x 11 mm subpleural mass is noted posteriorly in the left lung apex concerning for possible neoplasm. Upper Abdomen: No acute abnormality. Musculoskeletal: No chest wall abnormality. No acute or significant osseous findings. Review of the MIP images confirms the above findings. IMPRESSION: 17 x 11 mm  subpleural mass is noted posteriorly in the left lung apex concerning for possible neoplasm; PET scan is recommended for further evaluation. No definite evidence of pulmonary embolus. Multiple airspace opacities are noted bilaterally, right greater than left, consistent with multifocal pneumonia. Aortic Atherosclerosis (ICD10-I70.0). Electronically Signed   By: Marijo Conception M.D.   On: 09/21/2019 09:52

## 2019-09-22 NOTE — TOC Initial Note (Signed)
Transition of Care [facility]) - Initial/Assessment Note    Patient Details  Name: Aaron Foster MRN: [id number] Date of Birth: Dec 06, 1945  Transition of Care [facility]) CM/SW Contact:    Amador Cunas, [place] Phone Number: 09/22/2019, 11:50 AM  Clinical Narrative:  Pt admitted from [facility] location). Spoke to group home director Yvone Neu [phone] who confirmed pt is their resident and able to return [place] (+) at d/c with plan to continue quarantine in room. Also spoke to pt's sister/guardian, Milus Mallick  [phone] who is agreeable for pt to return to group home at d/c. Will need to contact Yvone Neu and group home RN Gena Fray [phone]) at d/c. SW/CM will follow and intervene as indicated.   Wandra Feinstein, MSW, LCSW [phone] (GV coverage)              Expected Discharge Plan: Group Home Barriers to Discharge: Continued Medical Work up   Patient Goals and CMS Choice Patient states their goals for this hospitalization and ongoing recovery are:: UTA      Expected Discharge Plan and Services Expected Discharge Plan: Group Home       Living arrangements for the past 2 months: Group Home                                      Prior Living Arrangements/Services Living arrangements for the past 2 months: Group Home Lives with:: Facility Resident Patient language and need for interpreter reviewed:: Yes          Care giver support system in place?: Yes (comment)   Criminal Activity/Legal Involvement Pertinent to Current Situation/Hospitalization: No - Comment as needed  Activities of Daily Living   ADL Screening (condition at time of admission) Patient's cognitive ability adequate to safely complete daily activities?: No Is the patient deaf or have difficulty hearing?: Yes Does the patient have difficulty seeing, even when wearing glasses/contacts?: Yes Does the patient have difficulty concentrating, remembering, or making  decisions?: Yes Patient able to express need for assistance with ADLs?: No Does the patient have difficulty dressing or bathing?: Yes Independently performs ADLs?: No Communication: Dependent Is this a change from baseline?: Pre-admission baseline Dressing (OT): Dependent Is this a change from baseline?: Pre-admission baseline Grooming: Dependent Is this a change from baseline?: Pre-admission baseline Feeding: Dependent Is this a change from baseline?: Pre-admission baseline Bathing: Dependent Is this a change from baseline?: Pre-admission baseline Toileting: Dependent Is this a change from baseline?: Pre-admission baseline In/Out Bed: Dependent Is this a change from baseline?: Pre-admission baseline Walks in Home: Needs assistance Is this a change from baseline?: Pre-admission baseline Does the patient have difficulty walking or climbing stairs?: Yes Weakness of Legs: Both Weakness of Arms/Hands: None  Permission Sought/Granted Permission sought to share information with : Facility Sport and exercise psychologist, Guardian                Emotional Assessment       Orientation: : Fluctuating Orientation (Suspected and/or reported Sundowners)   Psych Involvement: No (comment)  Admission diagnosis:  covid 19 Patient Active Problem List   Diagnosis Date Noted  . Pneumonia due to COVID-19 virus 09/21/2019  . Acute respiratory failure due to COVID-19 ([facility]) 09/21/2019  . OSA on CPAP 06/23/2018  . OSA (obstructive sleep apnea) 03/18/2018  . Vitamin D deficiency 04/08/2017  . Scoliosis 02/22/2016  . Recurrent falls 08/04/2015  . Dermatitis seborrheica 06/24/2015  .  Chronic constipation 06/24/2015  . Lives in group home 06/24/2015  . Protein calorie malnutrition (HCC) 06/24/2015  . Hearing loss of both ears 06/24/2015  . Wears hearing aid 06/24/2015  . History of iron deficiency anemia 06/24/2015  . Osteoporosis 06/24/2015  . Adult behavior problem 06/24/2015  . Hiatal hernia  06/24/2015  . Hemorrhoid 06/24/2015  . Gait instability 06/24/2015  . DDD (degenerative disc disease), lumbar 06/24/2015  . Nephrolithiasis 06/24/2015  . Hyperlipidemia 06/24/2015  . Compression fracture of L1 lumbar vertebra (HCC) 06/24/2015  . Family history of upper GI bleeding   . BPH with obstruction/lower urinary tract symptoms 06/21/2015  . Chronic pain 06/19/2015  . Acid reflux 06/19/2015  . Intellectual disability 06/19/2015   PCP:  Alba Cory, MD Pharmacy:   [facility] - [place], [place] - [address] [place] [address] Phone: [phone] Fax: [phone]     Social Determinants of Health (SDOH) Interventions    Readmission Risk Interventions No flowsheet data found.

## 2019-09-22 NOTE — Evaluation (Signed)
Physical Therapy Evaluation Patient Details Name: Aaron Foster MRN: [id number] DOB: [birth year] Today's Date: 09/22/2019   History of Present Illness  73 y/o male w/ hx of deafness, phimosis, osteoporosis, nephrolithiasis, insomnia, HLD, hrmoeehoid, esophageal reflux, chronic back pain, BPH, B renal cysts, anemia. Current resident of group home Presented to ED with AMS, chart states pt is nonverbal at basline and uses sign language, family reports he doesnt use sign language but genstures also he is going blind.  Clinical Impression   Pt admitted with above diagnosis. Pt was previously living in group home, chart reports he was able to use sign language as he was deaf, sister reports that pt does not use sign language but is able to use gestures and that pt is actually going blind also. Pt currently with functional limitations due to the deficits listed below (see PT Problem List). Pt is very agitated during assessment, he is yelling and thrashing around in bed, he is minimally able to be calmed and to participate in assessment. [name] needing max to total assist with all functional mobility this am, was able to move about in bed with total a and transfers supine<>sit with max a, poor sitting balance noted with pt once again thrashing about. Pt returned to supine and positioned in position of comfort by therapist and nurse.  At this time pt does not require skilled acute care level PT intervention, he seems to be at baseline functioning, and if not is close to it. He has been living at group home and his mobility needs met. At d/c he would be safest to return to his home environment. Thank you for this referral.     Follow Up Recommendations No PT follow up;Supervision/Assistance - 24 hour    Equipment Recommendations       Recommendations for Other Services       Precautions / Restrictions Precautions Precautions: Fall;Other (comment)(cognition, blindness, deafnes,  behaviours) Restrictions Weight Bearing Restrictions: No      Mobility  Bed Mobility Overal bed mobility: Needs Assistance Bed Mobility: Supine to Sit;Sit to Supine     Supine to sit: Total assist Sit to supine: Total assist   General bed mobility comments: was able to get to OEB but again pt guarding against motions and flayling hence very unsafe  Transfers                 General transfer comment: did not attempt, pt is very unsafe with mobility  Ambulation/Gait             General Gait Details: unable to ambulate during this assessment  Stairs            Wheelchair Mobility    Modified Rankin (Stroke Patients Only)       Balance Overall balance assessment: Needs assistance Sitting-balance support: Feet unsupported Sitting balance-Leahy Scale: Zero       Standing balance-Leahy Scale: Zero Standing balance comment: unable to stand at this time sec to strength but also sec to safety                             Pertinent Vitals/Pain      Home Living Family/patient expects to be discharged to:: Group home                      Prior Function Level of Independence: Needs assistance   Gait / Transfers Assistance Needed: does not seem  to have been ambulatory  ADL's / Homemaking Assistance Needed: needs A with all ADLs and IADLs        Hand Dominance        Extremity/Trunk Assessment   Upper Extremity Assessment Upper Extremity Assessment: Difficult to assess due to impaired cognition(BUE in crossed position and very difficult to uncross)    Lower Extremity Assessment Lower Extremity Assessment: Difficult to assess due to impaired cognition(BLE also in crossed position pt guarding against movement)       Communication   Communication: Receptive difficulties;Expressive difficulties;Deaf;Other (comment)(also blind)  Cognition Arousal/Alertness: Awake/alert Behavior During Therapy:  Restless;Agitated;Anxious Overall Cognitive Status: History of cognitive impairments - at baseline                                 General Comments: Pt is screaming and flayling in bed, difficult to communicate with him as he does not see gestures and can not hear sounds      General Comments General comments (skin integrity, edema, etc.): Pt is on room air, has pulled off all monitors etc, does have his condom cath in place but has also pulled off all clothes and blankets covering him. Not sure if this had ensory component, attempted placing pressure on upper arms and pt minimally seemed to calm down some, but many events occuring in room at time    Exercises     Assessment/Plan    PT Assessment Patent does not need any further PT services;All further PT needs can be met in the next venue of care  PT Problem List Decreased strength;Decreased activity tolerance;Decreased mobility;Decreased coordination;Decreased cognition;Decreased safety awareness       PT Treatment Interventions      PT Goals (Current goals can be found in the Care Plan section)  Acute Rehab PT Goals Patient Stated Goal: unable to state any goals    Frequency     Barriers to discharge        Co-evaluation               AM-PAC PT "6 Clicks" Mobility  Outcome Measure Help needed turning from your back to your side while in a flat bed without using bedrails?: Total Help needed moving from lying on your back to sitting on the side of a flat bed without using bedrails?: Total Help needed moving to and from a bed to a chair (including a wheelchair)?: Total Help needed standing up from a chair using your arms (e.g., wheelchair or bedside chair)?: Total Help needed to walk in hospital room?: Total Help needed climbing 3-5 steps with a railing? : Total 6 Click Score: 6    End of Session   Activity Tolerance: Treatment limited secondary to agitation Patient left: in bed Nurse Communication:  Other (comment)(nurse in room with therapist during assessment) PT Visit Diagnosis: Other abnormalities of gait and mobility (R26.89);Muscle weakness (generalized) (M62.81)    Time: 4801-6553 PT Time Calculation (min) (ACUTE ONLY): 15 min   Charges:   PT Evaluation $PT Eval High Complexity: 1 High          Horald Chestnut, PT   Delford Field 09/22/2019, 2:33 PM

## 2019-09-23 DIAGNOSIS — J9600 Acute respiratory failure, unspecified whether with hypoxia or hypercapnia: Secondary | ICD-10-CM

## 2019-09-23 LAB — COMPREHENSIVE METABOLIC PANEL
ALT: 34 U/L (ref 0–44)
AST: 63 U/L — ABNORMAL HIGH (ref 15–41)
Albumin: 3.3 g/dL — ABNORMAL LOW (ref 3.5–5.0)
Alkaline Phosphatase: 52 U/L (ref 38–126)
Anion gap: 11 (ref 5–15)
BUN: 28 mg/dL — ABNORMAL HIGH (ref 8–23)
CO2: 28 mmol/L (ref 22–32)
Calcium: 8.7 mg/dL — ABNORMAL LOW (ref 8.9–10.3)
Chloride: 100 mmol/L (ref 98–111)
Creatinine, Ser: 0.81 mg/dL (ref 0.61–1.24)
GFR calc Af Amer: >60 mL/min (ref >60)
GFR calc non Af Amer: >60 mL/min (ref >60)
Glucose, Bld: 175 mg/dL — ABNORMAL HIGH (ref 70–99)
Potassium: 4.5 mmol/L (ref 3.5–5.1)
Sodium: 139 mmol/L (ref 135–145)
Total Bilirubin: 0.7 mg/dL (ref 0.3–1.2)
Total Protein: 6.6 g/dL (ref 6.5–8.1)

## 2019-09-23 LAB — GLUCOSE, CAPILLARY
Glucose-Capillary: 146 mg/dL — ABNORMAL HIGH (ref 70–99)
Glucose-Capillary: 151 mg/dL — ABNORMAL HIGH (ref 70–99)

## 2019-09-23 LAB — CBC WITH DIFFERENTIAL/PLATELET
Abs Immature Granulocytes: 0.03 10*3/uL (ref 0.00–0.07)
Basophils Absolute: 0 10*3/uL (ref 0.0–0.1)
Basophils Relative: 0 %
Eosinophils Absolute: 0 10*3/uL (ref 0.0–0.5)
Eosinophils Relative: 0 %
HCT: 39.3 % (ref 39.0–52.0)
Hemoglobin: 12.8 g/dL — ABNORMAL LOW (ref 13.0–17.0)
Immature Granulocytes: 0 %
Lymphocytes Relative: 7 %
Lymphs Abs: 0.5 10*3/uL — ABNORMAL LOW (ref 0.7–4.0)
MCH: 31.2 pg (ref 26.0–34.0)
MCHC: 32.6 g/dL (ref 30.0–36.0)
MCV: 95.9 fL (ref 80.0–100.0)
Monocytes Absolute: 0.4 10*3/uL (ref 0.1–1.0)
Monocytes Relative: 6 %
Neutro Abs: 6.1 10*3/uL (ref 1.7–7.7)
Neutrophils Relative %: 87 %
Platelets: 298 10*3/uL (ref 150–400)
RBC: 4.1 MIL/uL — ABNORMAL LOW (ref 4.22–5.81)
RDW: 12.2 % (ref 11.5–15.5)
WBC: 7 10*3/uL (ref 4.0–10.5)
nRBC: 0 % (ref 0.0–0.2)

## 2019-09-23 LAB — D-DIMER, QUANTITATIVE: D-Dimer, Quant: 1.1 ug/mL-FEU — ABNORMAL HIGH (ref 0.00–0.50)

## 2019-09-23 LAB — HEMOGLOBIN A1C
Hgb A1c MFr Bld: 6 % — ABNORMAL HIGH (ref 4.8–5.6)
Mean Plasma Glucose: 125.5 mg/dL

## 2019-09-23 LAB — MAGNESIUM: Magnesium: 2 mg/dL (ref 1.7–2.4)

## 2019-09-23 LAB — BRAIN NATRIURETIC PEPTIDE: B Natriuretic Peptide: 112.4 pg/mL — ABNORMAL HIGH (ref 0.0–100.0)

## 2019-09-23 LAB — C-REACTIVE PROTEIN: CRP: 11.5 mg/dL — ABNORMAL HIGH (ref <1.0)

## 2019-09-23 MED ORDER — INSULIN ASPART 100 UNIT/ML ~~LOC~~ SOLN
3.0000 [IU] | Freq: Three times a day (TID) | SUBCUTANEOUS | Status: DC
  Administered 2019-09-23 – 2019-09-28 (×11): 3 [IU] via SUBCUTANEOUS

## 2019-09-23 MED ORDER — INSULIN ASPART 100 UNIT/ML ~~LOC~~ SOLN: 0.0000 [IU] | Freq: Every day | SUBCUTANEOUS | Status: DC

## 2019-09-23 MED ORDER — INSULIN ASPART 100 UNIT/ML ~~LOC~~ SOLN
0.0000 [IU] | Freq: Three times a day (TID) | SUBCUTANEOUS | Status: DC
  Administered 2019-09-23 (×2): 2 [IU] via SUBCUTANEOUS
  Administered 2019-09-24: 17:00:00 5 [IU] via SUBCUTANEOUS
  Administered 2019-09-24: 13:00:00 2 [IU] via SUBCUTANEOUS
  Administered 2019-09-25: 18:00:00 5 [IU] via SUBCUTANEOUS
  Administered 2019-09-26: 18:00:00 3 [IU] via SUBCUTANEOUS
  Administered 2019-09-27: 2 [IU] via SUBCUTANEOUS
  Administered 2019-09-27: 08:00:00 3 [IU] via SUBCUTANEOUS
  Administered 2019-09-27 – 2019-09-28 (×2): 2 [IU] via SUBCUTANEOUS

## 2019-09-23 NOTE — Plan of Care (Signed)
  Problem: Education: Goal: Knowledge of risk factors and measures for prevention of condition will improve 09/23/2019 0926 by Orvan Falconer, RN Outcome: Progressing 09/23/2019 0926 by Orvan Falconer, RN Outcome: Progressing   Problem: Coping: Goal: Psychosocial and spiritual needs will be supported 09/23/2019 0926 by Orvan Falconer, RN Outcome: Progressing 09/23/2019 0926 by Orvan Falconer, RN Outcome: Progressing   Problem: Respiratory: Goal: Will maintain a patent airway 09/23/2019 0926 by Orvan Falconer, RN Outcome: Progressing 09/23/2019 0926 by Orvan Falconer, RN Outcome: Progressing Goal: Complications related to the disease process, condition or treatment will be avoided or minimized 09/23/2019 0926 by Orvan Falconer, RN Outcome: Progressing 09/23/2019 0926 by Orvan Falconer, RN Outcome: Progressing

## 2019-09-23 NOTE — Progress Notes (Signed)
[facility] PROGRESS NOTE    Progress Note  Aaron Foster  NWG:[id number] DOB: 1946/06/20 DOA: 09/21/2019 PCP: Alba Cory, MD     Brief Narrative:   Aaron Foster is an 73 y.o. male past medical history significant for mental retardation nonverbal depression anxiety diagnosed with COVID-19 4 days prior to admission comes in with progressive shortness of breath and fever.  Cannot provide history since most of the history was obtained from the chart.  Given several doses of Ativan, CT scan suspected for lung mass or also pneumonia.  Assessment/Plan:   Acute respiratory failure with hypoxia due to Pneumonia due to COVID-19 virus Currently satting greater than 96% on room air, he has been weaned off the oxygen. His inflammatory markers are improving. Continue IV remdesivir and steroids.  Incidental lung mass: CT scan showed a 17 x 11 mm subpleural mass in the posterior left lung apex concerning for neoplasm. Unlikely a candidate for extensive work-up will need further work-up as an outpatient.  Acute confusional state/mental retardation: To new current home regimen, he is still significantly confused. Haldol IV as needed.  Ethics: The previous physician had an extensive discussion with his sister on 09/22/2019 in the morning, she relates that he is a DNR, she desires no feeding tube or extreme measures. She wanted continue gentle medical treatment and if the patient declines can move towards comfort care.   DVT prophylaxis: lovenox Family Communication:none Disposition Plan/Barrier to D/C: unable to determine Code Status:     Code Status Orders  (From admission, onward)         Start     Ordered   09/21/19 2121  Do not attempt resuscitation (DNR)  Continuous    Question Answer Comment  In the event of cardiac or respiratory ARREST Do not call a code blue   In the event of cardiac or respiratory ARREST Do not perform Intubation, CPR, defibrillation or  ACLS   In the event of cardiac or respiratory ARREST Use medication by any route, position, wound care, and other measures to relive pain and suffering. May use oxygen, suction and manual treatment of airway obstruction as needed for comfort.      09/21/19 2122        Code Status History    Date Active Date Inactive Code Status Order ID Comments User Context   09/21/2019 1051 09/21/2019 2051 DNR [id number]  Emily Filbert, MD ED   Advance Care Planning Activity        IV Access:    Peripheral IV   Procedures and diagnostic studies:   Dg Lumbar Spine 2-3 Views  Result Date: 09/21/2019 CLINICAL DATA:  Low back pain EXAM: LUMBAR SPINE - 2-3 VIEW COMPARISON:  CT abdomen 06/23/2013 FINDINGS: There is a chronic L1 vertebral body compression fracture unchanged compared with 06/23/2013. No other acute fracture. Alignment is normal. Degenerative disease with disc height loss throughout the lumbar spine. Bilateral facet arthropathy at L4-5 and L5-S1. Incidental note made of excreted IV contrast material within the renal collecting system from CT angiogram of the chest performed earlier same day. IMPRESSION: 1.  No acute osseous injury of the lumbar spine. 2. Lumbar spine spondylosis as described above. Electronically Signed   By: Elige Ko   On: 09/21/2019 11:10   Ct Angio Chest Pe W And/or Wo Contrast  Result Date: 09/21/2019 CLINICAL DATA:  Shortness of breath. EXAM: CT ANGIOGRAPHY CHEST WITH CONTRAST TECHNIQUE: Multidetector CT imaging of the chest was performed using the  standard protocol during bolus administration of intravenous contrast. Multiplanar CT image reconstructions and MIPs were obtained to evaluate the vascular anatomy. CONTRAST:  75mL OMNIPAQUE IOHEXOL 350 MG/ML SOLN COMPARISON:  None. FINDINGS: Cardiovascular: Satisfactory opacification of the pulmonary arteries to the segmental level. No evidence of pulmonary embolism. Normal heart size. No pericardial effusion.  Mediastinum/Nodes: No enlarged mediastinal, hilar, or axillary lymph nodes. Thyroid gland, trachea, and esophagus demonstrate no significant findings. Lungs/Pleura: No pneumothorax or pleural effusion is noted. Multiple airspace opacities are noted in both lungs, right greater than left. This is concerning for multifocal pneumonia. 17 x 11 mm subpleural mass is noted posteriorly in the left lung apex concerning for possible neoplasm. Upper Abdomen: No acute abnormality. Musculoskeletal: No chest wall abnormality. No acute or significant osseous findings. Review of the MIP images confirms the above findings. IMPRESSION: 17 x 11 mm subpleural mass is noted posteriorly in the left lung apex concerning for possible neoplasm; PET scan is recommended for further evaluation. No definite evidence of pulmonary embolus. Multiple airspace opacities are noted bilaterally, right greater than left, consistent with multifocal pneumonia. Aortic Atherosclerosis (ICD10-I70.0). Electronically Signed   By: Lupita RaiderJames  Green Jr M.D.   On: 09/21/2019 09:52     Medical Consultants:    None.  Anti-Infectives:   IV remdesivir.  Subjective:    Aaron GuadalajaraJoseph J Foster nonverbal.  Objective:    Vitals:   09/22/19 0730 09/22/19 1600 09/22/19 2055 09/23/19 0800  BP: 118/65 121/67 129/65 119/71  Pulse: 64 98 68 68  Resp: 20 18 18 16   Temp: 97.6 F (36.4 C) 98 F (36.7 C) 98 F (36.7 C) 97.7 F (36.5 C)  TempSrc: Axillary Axillary Oral Axillary  SpO2:   96% 96%  Weight:       SpO2: 96 % O2 Flow Rate (L/min): 2 L/min   Intake/Output Summary (Last 24 hours) at 09/23/2019 0910 Last data filed at 09/23/2019 0444 Gross per 24 hour  Intake 265 ml  Output 650 ml  Net -385 ml   Filed Weights   09/22/19 0424  Weight: 52.6 kg    Exam: General exam: In no acute distress. Respiratory system: Good air movement and clear to auscultation. Cardiovascular system: S1 & S2 heard, RRR. No JVD. Gastrointestinal system:  Abdomen is nondistended, soft and nontender.  Central nervous system: Alert and oriented. No focal neurological deficits. Extremities: No pedal edema. Skin: No rashes, lesions or ulcers Psychiatry: Judgement and insight appear normal. Mood & affect appropriate.    Data Reviewed:    Labs: Basic Metabolic Panel: Recent Labs  Lab 09/21/19 0735 09/22/19 1010 09/23/19 0624  NA 141 144 139  K 4.0 4.4 4.5  CL 102 105 100  CO2 24 28 28   GLUCOSE 96 127* 175*  BUN 27* 32* 28*  CREATININE 1.00 0.91 0.81  CALCIUM 8.7* 8.5* 8.7*  MG  --  2.2 2.0   GFR Estimated Creatinine Clearance: 61.3 mL/min (by C-G formula based on SCr of 0.81 mg/dL). Liver Function Tests: Recent Labs  Lab 09/21/19 0735 09/22/19 1010 09/23/19 0624  AST 71* 90* 63*  ALT 27 38 34  ALKPHOS 48 52 52  BILITOT 0.9 1.0 0.7  PROT 7.0 7.3 6.6  ALBUMIN 3.6 3.7 3.3*   No results for input(s): LIPASE, AMYLASE in the last 168 hours. No results for input(s): AMMONIA in the last 168 hours. Coagulation profile No results for input(s): INR, PROTIME in the last 168 hours. COVID-19 Labs  Recent Labs    09/21/19 [phone]  09/22/19 1010 09/23/19 0624  DDIMER  --  1.70* 1.10*  FERRITIN 518*  --   --   LDH 524*  --   --   CRP 17.5* 17.6* 11.5*    Lab Results  Component Value Date   SARSCOV2NAA POSITIVE (A) 09/21/2019    CBC: Recent Labs  Lab 09/21/19 0735 09/22/19 1010 09/23/19 0624  WBC 4.5 5.0 7.0  NEUTROABS 3.6 3.9 6.1  HGB 11.5* 12.3* 12.8*  HCT 34.8* 37.8* 39.3  MCV 93.0 95.7 95.9  PLT 216 251 298   Cardiac Enzymes: No results for input(s): CKTOTAL, CKMB, CKMBINDEX, TROPONINI in the last 168 hours. BNP (last 3 results) No results for input(s): PROBNP in the last 8760 hours. CBG: No results for input(s): GLUCAP in the last 168 hours. D-Dimer: Recent Labs    09/22/19 1010 09/23/19 0624  DDIMER 1.70* 1.10*   Hgb A1c: No results for input(s): HGBA1C in the last 72 hours. Lipid Profile: Recent  Labs    09/21/19 0735  TRIG 94   Thyroid function studies: No results for input(s): TSH, T4TOTAL, T3FREE, THYROIDAB in the last 72 hours.  Invalid input(s): FREET3 Anemia work up: Recent Labs    09/21/19 [place]*   Sepsis Labs: Recent Labs  Lab 09/21/19 0735 09/21/19 1030 09/22/19 1010 09/23/19 0624  PROCALCITON 0.32  --   --   --   WBC 4.5  --  5.0 7.0  LATICACIDVEN 2.0* 0.8  --   --    Microbiology Recent Results (from the past 240 hour(s))  Blood Culture (routine x 2)     Status: None (Preliminary result)   Collection Time: 09/21/19  7:35 AM   Specimen: BLOOD  Result Value Ref Range Status   Specimen Description BLOOD RIGHT FA  Final   Special Requests   Final    BOTTLES DRAWN AEROBIC AND ANAEROBIC Blood Culture adequate volume   Culture   Final    NO GROWTH 2 DAYS Performed at [facility], [address], [place], [place] [address]    Report Status PENDING  Incomplete  Blood Culture (routine x 2)     Status: None (Preliminary result)   Collection Time: 09/21/19  7:35 AM   Specimen: BLOOD  Result Value Ref Range Status   Specimen Description BLOOD RIGHT FA  Final   Special Requests   Final    BOTTLES DRAWN AEROBIC AND ANAEROBIC Blood Culture results may not be optimal due to an excessive volume of blood received in culture bottles   Culture   Final    NO GROWTH 2 DAYS Performed at [facility], [address], [place], [place] [address]    Report Status PENDING  Incomplete  SARS Coronavirus 2 by RT PCR (hospital order, performed in [facility] hospital lab) Nasopharyngeal Nasopharyngeal Swab     Status: Abnormal   Collection Time: 09/21/19  7:35 AM   Specimen: Nasopharyngeal Swab  Result Value Ref Range Status   SARS Coronavirus 2 POSITIVE (A) NEGATIVE Final    Comment: RESULT CALLED TO, READ BACK BY AND VERIFIED WITH:  Arlana Hove AT 0981 09/21/2019 SDR (NOTE) If result is NEGATIVE SARS-CoV-2 target nucleic acids are NOT  DETECTED. The SARS-CoV-2 RNA is generally detectable in upper and lower  respiratory specimens during the acute phase of infection. The lowest  concentration of SARS-CoV-2 viral copies this assay can detect is 250  copies / mL. A negative result does not preclude SARS-CoV-2 infection  and should  not be used as the sole basis for treatment or other  patient management decisions.  A negative result may occur with  improper specimen collection / handling, submission of specimen other  than nasopharyngeal swab, presence of viral mutation(s) within the  areas targeted by this assay, and inadequate number of viral copies  (<250 copies / mL). A negative result must be combined with clinical  observations, patient history, and epidemiological information. If result is POSITIVE SARS-CoV-2 target nucleic acids are DETECTED. The  SARS-CoV-2 RNA is generally detectable in upper and lower  respiratory specimens during the acute phase of infection.  Positive  results are indicative of active infection with SARS-CoV-2.  Clinical  correlation with patient history and other diagnostic information is  necessary to determine patient infection status.  Positive results do  not rule out bacterial infection or co-infection with other viruses. If result is PRESUMPTIVE POSTIVE SARS-CoV-2 nucleic acids MAY BE PRESENT.   A presumptive positive result was obtained on the submitted specimen  and confirmed on repeat testing.  While 2019 novel coronavirus  (SARS-CoV-2) nucleic acids may be present in the submitted sample  additional confirmatory testing may be necessary for epidemiological  and / or clinical management purposes  to differentiate between  SARS-CoV-2 and other Sarbecovirus currently known to infect humans.  If clinically indicated additional testing with an alternate test  methodology [phone]) is a dvised. The SARS-CoV-2 RNA is generally  detectable in upper and lower respiratory specimens during  the acute  phase of infection. The expected result is Negative. Fact Sheet for Patients:  BoilerBrush.com.cy Fact Sheet for Healthcare Providers: https://pope.com/ This test is not yet approved or cleared by the [place] FDA and has been authorized for detection and/or diagnosis of SARS-CoV-2 by FDA under an Emergency Use Authorization (EUA).  This EUA will remain in effect (meaning this test can be used) for the duration of the COVID-19 declaration under Section 564(b)(1) of the Act, 21 U.S.C. section 360bbb-3(b)(1), unless the authorization is terminated or revoked sooner. Performed at [facility], [address] [place], [place] [address]      Medications:    cholecalciferol  1,000 Units Oral Daily   dexamethasone (DECADRON) injection  6 mg Intravenous Q24H   DULoxetine  120 mg Oral Daily   enoxaparin (LOVENOX) injection  40 mg Subcutaneous Q24H   gabapentin  200 mg Oral TID   lubiprostone  24 mcg Oral BID WC   pantoprazole  40 mg Oral Daily   Ensure Max Protein  11 oz Oral BID   QUEtiapine  200 mg Oral Q1500   sodium chloride flush  3 mL Intravenous Q12H   tamsulosin  0.4 mg Oral Daily   traZODone  50 mg Oral QHS   vitamin C  500 mg Oral Daily   zinc sulfate  220 mg Oral Daily   Continuous Infusions:  sodium chloride     remdesivir 100 mg in NS 250 mL 100 mg (09/22/19 1141)     LOS: 2 days   Marinda Elk  [facility]  09/23/2019, 9:10 AM

## 2019-09-23 NOTE — Evaluation (Signed)
Occupational Therapy Evaluation Patient Details Name: Aaron Foster MRN: [id number] DOB: Feb 15, 1946 Today's Date: 09/23/2019    History of Present Illness 73 y/o male w/ hx of deafness, phimosis, osteoporosis, nephrolithiasis, insomnia, HLD, hrmoeehoid, esophageal reflux, chronic back pain, BPH, B renal cysts, anemia. Current resident of group home Presented to ED with AMS, chart states pt is nonverbal at basline and uses sign language, family reports he doesnt use sign language but genstures also he is going blind.   Clinical Impression   Pt presents supine in bed, initially rather lethargic but once awake pt restless and somewhat agitated. Attempted to have pt engage in simple BADL and pt unable. Difficult to redirect. Attempted to contact group home for PLOF but no answer. Per RN pt totalA at baseline and suspect likely close to/at his baseline. Given pt's cognitive impairments feel he will likely benefit from returning to his familiar environment at time of discharge. No further acute OT needs identified at this time. Will sign off, thank you for this referral.       Follow Up Recommendations  No OT follow up;Supervision/Assistance - 24 hour(rec return to [facility] if able)    Equipment Recommendations  None recommended by OT           Precautions / Restrictions Precautions Precautions: Fall;Other (comment)(cognition, blindness, deaf, behaviours) Restrictions Weight Bearing Restrictions: No      Mobility Bed Mobility               General bed mobility comments: deferred due to agitation  Transfers                      Balance                                           ADL either performed or assessed with clinical judgement   ADL Overall ADL's : Needs assistance/impaired                                       General ADL Comments: totalA     Vision         Perception     Praxis      Pertinent Vitals/Pain  Pain Assessment: Faces Faces Pain Scale: Hurts little more Pain Location: general discomfort Pain Descriptors / Indicators: Grimacing Pain Intervention(s): Monitored during session     Hand Dominance     Extremity/Trunk Assessment Upper Extremity Assessment Upper Extremity Assessment: Difficult to assess due to impaired cognition;LUE deficits/detail LUE Deficits / Details: noted soft wrist splint on L UE   Lower Extremity Assessment Lower Extremity Assessment: Defer to PT evaluation       Communication Communication Communication: Receptive difficulties;Expressive difficulties;Deaf;Other (comment)(also blind, mumbles)   Cognition Arousal/Alertness: Awake/alert Behavior During Therapy: Restless;Agitated;Anxious Overall Cognitive Status: History of cognitive impairments - at baseline                                 General Comments: pt yelling/screaming and difficult to assess whether due to discomfort or agitated, pt currently with sitter   General Comments       Exercises     Shoulder Instructions      Home Living Family/patient expects to be discharged  to:: Group home                                        Prior Functioning/Environment Level of Independence: Needs assistance  Gait / Transfers Assistance Needed: does not seem to have been ambulatory ADL's / Homemaking Assistance Needed: needs A with all ADLs and IADLs Communication / Swallowing Assistance Needed: unknown communication as some sources say sign language and others state he doesnt, but can gesture Comments: RN reports totalA at baseline        OT Problem List: Decreased strength;Decreased activity tolerance;Decreased cognition;Impaired vision/perception;Decreased safety awareness;Impaired UE functional use      OT Treatment/Interventions:      OT Goals(Current goals can be found in the care plan section) Acute Rehab OT Goals Patient Stated Goal: unable to state any  goals OT Goal Formulation: All assessment and education complete, DC therapy  OT Frequency:     Barriers to D/C:            Co-evaluation              AM-PAC OT "6 Clicks" Daily Activity     Outcome Measure Help from another person eating meals?: Total Help from another person taking care of personal grooming?: Total Help from another person toileting, which includes using toliet, bedpan, or urinal?: Total Help from another person bathing (including washing, rinsing, drying)?: Total Help from another person to put on and taking off regular upper body clothing?: Total Help from another person to put on and taking off regular lower body clothing?: Total 6 Click Score: 6   End of Session Nurse Communication: Mobility status  Activity Tolerance: Treatment limited secondary to agitation Patient left: in bed;with call bell/phone within reach;with nursing/sitter in room  OT Visit Diagnosis: Muscle weakness (generalized) (M62.81);Other symptoms and signs involving cognitive function                Time: 1447-1456 OT Time Calculation (min): 9 min Charges:  OT General Charges $OT Visit: 1 Visit OT Evaluation $OT Eval Moderate Complexity: 1 Mod  Aaron Foster, OT [employer] Pager [phone] Office [phone]  Aaron Foster 09/23/2019, 5:05 PM

## 2019-09-24 LAB — CBC WITH DIFFERENTIAL/PLATELET
Abs Immature Granulocytes: 0.03 10*3/uL (ref 0.00–0.07)
Basophils Absolute: 0 10*3/uL (ref 0.0–0.1)
Basophils Relative: 1 %
Eosinophils Absolute: 0 10*3/uL (ref 0.0–0.5)
Eosinophils Relative: 0 %
HCT: 39.1 % (ref 39.0–52.0)
Hemoglobin: 12.6 g/dL — ABNORMAL LOW (ref 13.0–17.0)
Immature Granulocytes: 1 %
Lymphocytes Relative: 11 %
Lymphs Abs: 0.5 10*3/uL — ABNORMAL LOW (ref 0.7–4.0)
MCH: 30.8 pg (ref 26.0–34.0)
MCHC: 32.2 g/dL (ref 30.0–36.0)
MCV: 95.6 fL (ref 80.0–100.0)
Monocytes Absolute: 0.3 10*3/uL (ref 0.1–1.0)
Monocytes Relative: 7 %
Neutro Abs: 3.5 10*3/uL (ref 1.7–7.7)
Neutrophils Relative %: 80 %
Platelets: 329 10*3/uL (ref 150–400)
RBC: 4.09 MIL/uL — ABNORMAL LOW (ref 4.22–5.81)
RDW: 12.4 % (ref 11.5–15.5)
WBC: 4.3 10*3/uL (ref 4.0–10.5)
nRBC: 0 % (ref 0.0–0.2)

## 2019-09-24 LAB — COMPREHENSIVE METABOLIC PANEL
ALT: 32 U/L (ref 0–44)
AST: 43 U/L — ABNORMAL HIGH (ref 15–41)
Albumin: 3.5 g/dL (ref 3.5–5.0)
Alkaline Phosphatase: 59 U/L (ref 38–126)
Anion gap: 13 (ref 5–15)
BUN: 32 mg/dL — ABNORMAL HIGH (ref 8–23)
CO2: 25 mmol/L (ref 22–32)
Calcium: 8.5 mg/dL — ABNORMAL LOW (ref 8.9–10.3)
Chloride: 100 mmol/L (ref 98–111)
Creatinine, Ser: 0.8 mg/dL (ref 0.61–1.24)
GFR calc Af Amer: >60 mL/min (ref >60)
GFR calc non Af Amer: >60 mL/min (ref >60)
Glucose, Bld: 131 mg/dL — ABNORMAL HIGH (ref 70–99)
Potassium: 4.6 mmol/L (ref 3.5–5.1)
Sodium: 138 mmol/L (ref 135–145)
Total Bilirubin: 1.1 mg/dL (ref 0.3–1.2)
Total Protein: 7 g/dL (ref 6.5–8.1)

## 2019-09-24 LAB — C-REACTIVE PROTEIN: CRP: 10.7 mg/dL — ABNORMAL HIGH (ref <1.0)

## 2019-09-24 LAB — D-DIMER, QUANTITATIVE: D-Dimer, Quant: 0.97 ug/mL-FEU — ABNORMAL HIGH (ref 0.00–0.50)

## 2019-09-24 LAB — GLUCOSE, CAPILLARY
Glucose-Capillary: 102 mg/dL — ABNORMAL HIGH (ref 70–99)
Glucose-Capillary: 138 mg/dL — ABNORMAL HIGH (ref 70–99)
Glucose-Capillary: 238 mg/dL — ABNORMAL HIGH (ref 70–99)
Glucose-Capillary: 70 mg/dL (ref 70–99)

## 2019-09-24 LAB — MAGNESIUM: Magnesium: 2.2 mg/dL (ref 1.7–2.4)

## 2019-09-24 LAB — BRAIN NATRIURETIC PEPTIDE: B Natriuretic Peptide: 52.2 pg/mL (ref 0.0–100.0)

## 2019-09-24 MED ORDER — HALOPERIDOL LACTATE 5 MG/ML IJ SOLN
2.0000 mg | Freq: Once | INTRAMUSCULAR | Status: AC
  Administered 2019-09-24: 14:00:00 2 mg via INTRAVENOUS
  Filled 2019-09-24: qty 1

## 2019-09-24 NOTE — Plan of Care (Addendum)
Patient in bed. Continues to display behaviors and increased agitation. Given Haldol not effective. Spoke to sister with a daily update. Will continue to monitor for remainder of shift.    Problem: Education: Goal: Knowledge of risk factors and measures for prevention of condition will improve 09/24/2019 1141 by Orvan Falconer, RN Outcome: Progressing 09/24/2019 1141 by Orvan Falconer, RN Outcome: Progressing   Problem: Coping: Goal: Psychosocial and spiritual needs will be supported 09/24/2019 1141 by Orvan Falconer, RN Outcome: Progressing 09/24/2019 1141 by Orvan Falconer, RN Outcome: Progressing   Problem: Respiratory: Goal: Will maintain a patent airway 09/24/2019 1141 by Orvan Falconer, RN Outcome: Progressing 09/24/2019 1141 by Orvan Falconer, RN Outcome: Progressing Goal: Complications related to the disease process, condition or treatment will be avoided or minimized 09/24/2019 1141 by Orvan Falconer, RN Outcome: Progressing 09/24/2019 1141 by Orvan Falconer, RN Outcome: Progressing

## 2019-09-24 NOTE — Progress Notes (Signed)
[facility] PROGRESS NOTE    Progress Note  Aaron RoverJoseph J Foster  ZOX:[id number] DOB: 01-12-1946 DOA: 09/21/2019 PCP: Alba CorySowles, Krichna, MD     Brief Narrative:   Aaron Foster is an 73 y.o. male past medical history significant for mental retardation nonverbal depression anxiety diagnosed with COVID-19 4 days prior to admission comes in with progressive shortness of breath and fever.  Cannot provide history since most of the history was obtained from the chart.  Given several doses of Ativan, CT scan suspected for lung mass or also pneumonia.  Assessment/Plan:   Acute respiratory failure with hypoxia due to Pneumonia due to COVID-19 virus Currently satting greater than 96% on room air, he has been weaned off the oxygen. Continue to trend inflammatory markers. Continue IV remdesivir steroids, vitamin C and zinc. Inflammatory markers are improving.  Incidental lung mass: CT scan showed a 17 x 11 mm subpleural mass in the posterior left lung apex concerning for neoplasm. Unlikely a candidate for extensive work-up will need further work-up as an outpatient.  Acute confusional state/mental retardation: Continues to be significantly confused, continue Haldol IV as needed.  Ethics: The previous physician had an extensive discussion with his sister on 09/22/2019 in the morning, she relates that he is a DNR, she desires no feeding tube or extreme measures. She wanted continue gentle medical treatment and if the patient declines can move towards comfort care.   DVT prophylaxis: lovenox Family Communication:none Disposition Plan/Barrier to D/C: unable to determine Code Status:     Code Status Orders  (From admission, onward)         Start     Ordered   09/21/19 2121  Do not attempt resuscitation (DNR)  Continuous    Question Answer Comment  In the event of cardiac or respiratory ARREST Do not call a "code blue"   In the event of cardiac or respiratory ARREST Do not perform  Intubation, CPR, defibrillation or ACLS   In the event of cardiac or respiratory ARREST Use medication by any route, position, wound care, and other measures to relive pain and suffering. May use oxygen, suction and manual treatment of airway obstruction as needed for comfort.      09/21/19 2122        Code Status History    Date Active Date Inactive Code Status Order ID Comments User Context   09/21/2019 1051 09/21/2019 2051 DNR [id number]  Emily FilbertWilliams, Jonathan E, MD ED   Advance Care Planning Activity        IV Access:    Peripheral IV   Procedures and diagnostic studies:   No results found.   Medical Consultants:    None.  Anti-Infectives:   IV remdesivir.  Subjective:    Aaron Foster nonverbal.  Objective:    Vitals:   09/23/19 2022 09/23/19 2025 09/24/19 0515 09/24/19 0729  BP: (!) 88/55 (!) 88/55 90/62 111/76  Pulse:  72 70 60  Resp: 17  20 16   Temp: 98.3 F (36.8 C)  98 F (36.7 C) (!) 97.3 F (36.3 C)  TempSrc: Axillary   Axillary  SpO2: 99% 97% 100% 98%  Weight:       SpO2: 98 % O2 Flow Rate (L/min): 2 L/min   Intake/Output Summary (Last 24 hours) at 09/24/2019 0806 Last data filed at 09/23/2019 1816 Gross per 24 hour  Intake 150 ml  Output 725 ml  Net -575 ml   Filed Weights   09/22/19 0424  Weight: 52.6 kg  Exam: General exam: In no acute distress. Respiratory system: Good air movement and clear to auscultation. Cardiovascular system: S1 & S2 heard, RRR. No JVD. Gastrointestinal system: Abdomen is nondistended, soft and nontender.  Central nervous system: Moving all 4 extremities without any difficulties. Extremities: No pedal edema. Skin: No rashes, lesions or ulcers  Data Reviewed:    Labs: Basic Metabolic Panel: Recent Labs  Lab 09/21/19 0735 09/22/19 1010 09/23/19 0624 09/24/19 0250  NA 141 144 139 138  K 4.0 4.4 4.5 4.6  CL 102 105 100 100  CO2 GLUCOSE 96 127* 175* 131*  BUN 27* 32* 28*  32*  CREATININE 1.00 0.91 0.81 0.80  CALCIUM 8.7* 8.5* 8.7* 8.5*  MG  --  2.2 2.0 2.2   GFR Estimated Creatinine Clearance: 62.1 mL/min (by C-G formula based on SCr of 0.8 mg/dL). Liver Function Tests: Recent Labs  Lab 09/21/19 0735 09/22/19 1010 09/23/19 0624 09/24/19 0250  AST 71* 90* 63* 43*  ALT 27 38 34 32  ALKPHOS 48 52 52 59  BILITOT 0.9 1.0 0.7 1.1  PROT 7.0 7.3 6.6 7.0  ALBUMIN 3.6 3.7 3.3* 3.5   No results for input(s): LIPASE, AMYLASE in the last 168 hours. No results for input(s): AMMONIA in the last 168 hours. Coagulation profile No results for input(s): INR, PROTIME in the last 168 hours. COVID-19 Labs  Recent Labs    09/22/19 1010 09/23/19 0624 09/24/19 0250  DDIMER 1.70* 1.10* 0.97*  CRP 17.6* 11.5* 10.7*    Lab Results  Component Value Date   SARSCOV2NAA POSITIVE (A) 09/21/2019    CBC: Recent Labs  Lab 09/21/19 0735 09/22/19 1010 09/23/19 0624 09/24/19 0250  WBC 4.5 5.0 7.0 4.3  NEUTROABS 3.6 3.9 6.1 3.5  HGB 11.5* 12.3* 12.8* 12.6*  HCT 34.8* 37.8* 39.3 39.1  MCV 93.0 95.7 95.9 95.6  PLT 216 251 298 329   Cardiac Enzymes: No results for input(s): CKTOTAL, CKMB, CKMBINDEX, TROPONINI in the last 168 hours. BNP (last 3 results) No results for input(s): PROBNP in the last 8760 hours. CBG: Recent Labs  Lab 09/23/19 1519 09/23/19 1708  GLUCAP 146* 151*   D-Dimer: Recent Labs    09/23/19 0624 09/24/19 0250  DDIMER 1.10* 0.97*   Hgb A1c: Recent Labs    09/23/19 0624  HGBA1C 6.0*   Lipid Profile: No results for input(s): CHOL, HDL, LDLCALC, TRIG, CHOLHDL, LDLDIRECT in the last 72 hours. Thyroid function studies: No results for input(s): TSH, T4TOTAL, T3FREE, THYROIDAB in the last 72 hours.  Invalid input(s): FREET3 Anemia work up: No results for input(s): VITAMINB12, FOLATE, FERRITIN, TIBC, IRON, RETICCTPCT in the last 72 hours. Sepsis Labs: Recent Labs  Lab 09/21/19 0735 09/21/19 1030 09/22/19 1010 09/23/19 0624  09/24/19 0250  PROCALCITON 0.32  --   --   --   --   WBC 4.5  --  5.0 7.0 4.3  LATICACIDVEN 2.0* 0.8  --   --   --    Microbiology Recent Results (from the past 240 hour(s))  Blood Culture (routine x 2)     Status: None (Preliminary result)   Collection Time: 09/21/19  7:35 AM   Specimen: BLOOD  Result Value Ref Range Status   Specimen Description BLOOD RIGHT FA  Final   Special Requests   Final    BOTTLES DRAWN AEROBIC AND ANAEROBIC Blood Culture adequate volume   Culture   Final    NO GROWTH 3 DAYS Performed at [facility], [place], [place] [address]    Report Status PENDING  Incomplete  Blood Culture (routine x 2)     Status: None (Preliminary result)   Collection Time: 09/21/19  7:35 AM   Specimen: BLOOD  Result Value Ref Range Status   Specimen Description BLOOD RIGHT FA  Final   Special Requests   Final    BOTTLES DRAWN AEROBIC AND ANAEROBIC Blood Culture results may not be optimal due to an excessive volume of blood received in culture bottles   Culture   Final    NO GROWTH 3 DAYS Performed at [facility], [address] [place],  [address]    Report Status PENDING  Incomplete  SARS Coronavirus 2 by RT PCR (hospital order, performed in [place] hospital lab) Nasopharyngeal Nasopharyngeal Swab     Status: Abnormal   Collection Time: 09/21/19  7:35 AM   Specimen: Nasopharyngeal Swab  Result Value Ref Range Status   SARS Coronavirus 2 POSITIVE (A) NEGATIVE Final    Comment: RESULT CALLED TO, READ BACK BY AND VERIFIED WITH:  Arlana Hove AT 0938 09/21/2019 SDR (NOTE) If result is NEGATIVE SARS-CoV-2 target nucleic acids are NOT DETECTED. The SARS-CoV-2 RNA is generally detectable in upper and lower  respiratory specimens during the acute phase of infection. The lowest  concentration of SARS-CoV-2 viral copies this assay can detect is 250  copies / mL. A negative result does not preclude SARS-CoV-2 infection  and should not  be used as the sole basis for treatment or other  patient management decisions.  A negative result may occur with  improper specimen collection / handling, submission of specimen other  than nasopharyngeal swab, presence of viral mutation(s) within the  areas targeted by this assay, and inadequate number of viral copies  (<250 copies / mL). A negative result must be combined with clinical  observations, patient history, and epidemiological information. If result is POSITIVE SARS-CoV-2 target nucleic acids are DETECTED. The  SARS-CoV-2 RNA is generally detectable in upper and lower  respiratory specimens during the acute phase of infection.  Positive  results are indicative of active infection with SARS-CoV-2.  Clinical  correlation with patient history and other diagnostic information is  necessary to determine patient infection status.  Positive results do  not rule out bacterial infection or co-infection with other viruses. If result is PRESUMPTIVE POSTIVE SARS-CoV-2 nucleic acids MAY BE PRESENT.   A presumptive positive result was obtained on the submitted specimen  and confirmed on repeat testing.  While 2019 novel coronavirus  (SARS-CoV-2) nucleic acids may be present in the submitted sample  additional confirmatory testing may be necessary for epidemiological  and / or clinical management purposes  to differentiate between  SARS-CoV-2 and other Sarbecovirus currently known to infect humans.  If clinically indicated additional testing with an alternate test  methodology [phone]) is a dvised. The SARS-CoV-2 RNA is generally  detectable in upper and lower respiratory specimens during the acute  phase of infection. The expected result is Negative. Fact Sheet for Patients:  StrictlyIdeas.no Fact Sheet for Healthcare Providers: BankingDealers.co.za This test is not yet approved or cleared by the [place] FDA and has been authorized  for detection and/or diagnosis of SARS-CoV-2 by FDA under an Emergency Use Authorization (EUA).  This EUA will remain in effect (meaning this test can be used) for the duration of the COVID-19 declaration under Section 564(b)(1) of the Act, 21 U.S.C. section 360bbb-3(b)(1), unless the authorization is terminated  or revoked sooner. Performed at [facility] [address] [place], [place] [address]      Medications:   . cholecalciferol  1,000 Units Oral Daily  . dexamethasone (DECADRON) injection  6 mg Intravenous Q24H  . DULoxetine  120 mg Oral Daily  . enoxaparin (LOVENOX) injection  40 mg Subcutaneous Q24H  . gabapentin  200 mg Oral TID  . insulin aspart  0-15 Units Subcutaneous TID WC  . insulin aspart  0-5 Units Subcutaneous QHS  . insulin aspart  3 Units Subcutaneous TID WC  . lubiprostone  24 mcg Oral BID WC  . pantoprazole  40 mg Oral Daily  . Ensure Max Protein  11 oz Oral BID  . QUEtiapine  200 mg Oral Q1500  . sodium chloride flush  3 mL Intravenous Q12H  . tamsulosin  0.4 mg Oral Daily  . traZODone  50 mg Oral QHS  . vitamin C  500 mg Oral Daily  . zinc sulfate  220 mg Oral Daily   Continuous Infusions: . sodium chloride    . remdesivir 100 mg in NS 250 mL 100 mg (09/23/19 1206)     LOS: 3 days   Marinda Elk  [facility]  09/24/2019, 8:06 AM

## 2019-09-25 ENCOUNTER — Ambulatory Visit: Payer: Medicare Other | Admitting: Family Medicine

## 2019-09-25 LAB — CBC WITH DIFFERENTIAL/PLATELET
Abs Immature Granulocytes: 0.06 10*3/uL (ref 0.00–0.07)
Basophils Absolute: 0 10*3/uL (ref 0.0–0.1)
Basophils Relative: 0 %
Eosinophils Absolute: 0 10*3/uL (ref 0.0–0.5)
Eosinophils Relative: 0 %
HCT: 37.4 % — ABNORMAL LOW (ref 39.0–52.0)
Hemoglobin: 12 g/dL — ABNORMAL LOW (ref 13.0–17.0)
Immature Granulocytes: 1 %
Lymphocytes Relative: 11 %
Lymphs Abs: 0.8 10*3/uL (ref 0.7–4.0)
MCH: 30.9 pg (ref 26.0–34.0)
MCHC: 32.1 g/dL (ref 30.0–36.0)
MCV: 96.4 fL (ref 80.0–100.0)
Monocytes Absolute: 0.4 10*3/uL (ref 0.1–1.0)
Monocytes Relative: 6 %
Neutro Abs: 6.2 10*3/uL (ref 1.7–7.7)
Neutrophils Relative %: 82 %
Platelets: 339 10*3/uL (ref 150–400)
RBC: 3.88 MIL/uL — ABNORMAL LOW (ref 4.22–5.81)
RDW: 12.4 % (ref 11.5–15.5)
WBC: 7.6 10*3/uL (ref 4.0–10.5)
nRBC: 0 % (ref 0.0–0.2)

## 2019-09-25 LAB — COMPREHENSIVE METABOLIC PANEL
ALT: 36 U/L (ref 0–44)
AST: 44 U/L — ABNORMAL HIGH (ref 15–41)
Albumin: 3.1 g/dL — ABNORMAL LOW (ref 3.5–5.0)
Alkaline Phosphatase: 61 U/L (ref 38–126)
Anion gap: 9 (ref 5–15)
BUN: 32 mg/dL — ABNORMAL HIGH (ref 8–23)
CO2: 28 mmol/L (ref 22–32)
Calcium: 8.8 mg/dL — ABNORMAL LOW (ref 8.9–10.3)
Chloride: 103 mmol/L (ref 98–111)
Creatinine, Ser: 0.8 mg/dL (ref 0.61–1.24)
GFR calc Af Amer: >60 mL/min (ref >60)
GFR calc non Af Amer: >60 mL/min (ref >60)
Glucose, Bld: 142 mg/dL — ABNORMAL HIGH (ref 70–99)
Potassium: 4.3 mmol/L (ref 3.5–5.1)
Sodium: 140 mmol/L (ref 135–145)
Total Bilirubin: 0.6 mg/dL (ref 0.3–1.2)
Total Protein: 6.5 g/dL (ref 6.5–8.1)

## 2019-09-25 LAB — C-REACTIVE PROTEIN: CRP: 6.4 mg/dL — ABNORMAL HIGH (ref <1.0)

## 2019-09-25 LAB — GLUCOSE, CAPILLARY
Glucose-Capillary: 89 mg/dL (ref 70–99)
Glucose-Capillary: 71 mg/dL (ref 70–99)
Glucose-Capillary: 231 mg/dL — ABNORMAL HIGH (ref 70–99)
Glucose-Capillary: 116 mg/dL — ABNORMAL HIGH (ref 70–99)

## 2019-09-25 LAB — BRAIN NATRIURETIC PEPTIDE: B Natriuretic Peptide: 33.1 pg/mL (ref 0.0–100.0)

## 2019-09-25 LAB — MAGNESIUM: Magnesium: 2 mg/dL (ref 1.7–2.4)

## 2019-09-25 LAB — D-DIMER, QUANTITATIVE: D-Dimer, Quant: 1.03 ug/mL-FEU — ABNORMAL HIGH (ref 0.00–0.50)

## 2019-09-25 NOTE — Progress Notes (Signed)
[facility] PROGRESS NOTE    Progress Note  Aaron Foster  [id number] DOB: 1946-08-11 DOA: 09/21/2019 PCP: Alba CorySowles, Krichna, MD     Brief Narrative:   Aaron Foster is an 73 y.o. male past medical history significant for mental retardation nonverbal depression anxiety diagnosed with COVID-19 4 days prior to admission comes in with progressive shortness of breath and fever.  Cannot provide history since most of the history was obtained from the chart.  Given several doses of Ativan, CT scan suspected for lung mass or also pneumonia.  Assessment/Plan:   Acute respiratory failure with hypoxia due to Pneumonia due to COVID-19 virus Currently satting greater than 93% on 2 L. 40 markers continue to improve, continue IV remdesivir steroids, vitamin C and zinc.  Incidental lung mass: CT scan showed a 17 x 11 mm subpleural mass in the posterior left lung apex concerning for neoplasm. Unlikely a candidate for extensive work-up will need further work-up as an outpatient.  Acute confusional state/mental retardation: Confusion seems to be resolving is more calm and tolerating his diet.   Ethics: The previous physician had an extensive discussion with his sister on 09/22/2019 in the morning, she relates that he is a DNR, she desires no feeding tube or extreme measures. She wanted continue gentle medical treatment and if the patient declines can move towards comfort care.   DVT prophylaxis: lovenox Family Communication:none Disposition Plan/Barrier to D/C: unable to determine Code Status:     Code Status Orders  (From admission, onward)         Start     Ordered   09/21/19 2121  Do not attempt resuscitation (DNR)  Continuous    Question Answer Comment  In the event of cardiac or respiratory ARREST Do not call a "code blue"   In the event of cardiac or respiratory ARREST Do not perform Intubation, CPR, defibrillation or ACLS   In the event of cardiac or respiratory  ARREST Use medication by any route, position, wound care, and other measures to relive pain and suffering. May use oxygen, suction and manual treatment of airway obstruction as needed for comfort.      09/21/19 2122        Code Status History    Date Active Date Inactive Code Status Order ID Comments User Context   09/21/2019 1051 09/21/2019 2051 DNR [id number]  Emily FilbertWilliams, Jonathan E, MD ED   Advance Care Planning Activity        IV Access:    Peripheral IV   Procedures and diagnostic studies:   No results found.   Medical Consultants:    None.  Anti-Infectives:   IV remdesivir.  Subjective:    Aaron Foster nonverbal.  Objective:    Vitals:   09/24/19 2000 09/24/19 2001 09/25/19 0319 09/25/19 0748  BP: 102/89  106/68 116/68  Pulse: 83  66 74  Resp: 20  16 18   Temp: (!) 96.9 F (36.1 C)  (!) 96.6 F (35.9 C) 97.8 F (36.6 C)  TempSrc: Axillary  Axillary Axillary  SpO2: (!) 88% 93% 93% 94%  Weight:       SpO2: 94 % O2 Flow Rate (L/min): 2 L/min   Intake/Output Summary (Last 24 hours) at 09/25/2019 0807 Last data filed at 09/24/2019 2200 Gross per 24 hour  Intake 120 ml  Output 800 ml  Net -680 ml   Filed Weights   09/22/19 0424  Weight: 52.6 kg    Exam: General exam: In no acute  distress. Respiratory system: Good air movement and clear to auscultation. Cardiovascular system: S1 & S2 heard, RRR. No JVD. Gastrointestinal system: Abdomen is nondistended, soft and nontender.  Central nervous system: Moving all 4 extremities without any difficulties. Extremities: No pedal edema. Skin: No rashes, lesions or ulcers  Data Reviewed:    Labs: Basic Metabolic Panel: Recent Labs  Lab 09/21/19 0735 09/22/19 1010 09/23/19 0624 09/24/19 0250 09/25/19 0202  NA 141 144 139 138 140  K 4.0 4.4 4.5 4.6 4.3  CL 102 105 100 100 103  CO2 24 28 28 25 28   GLUCOSE 96 127* 175* 131* 142*  BUN 27* 32* 28* 32* 32*  CREATININE 1.00 0.91 0.81 0.80  0.80  CALCIUM 8.7* 8.5* 8.7* 8.5* 8.8*  MG  --  2.2 2.0 2.2 2.0   GFR Estimated Creatinine Clearance: 62.1 mL/min (by C-G formula based on SCr of 0.8 mg/dL). Liver Function Tests: Recent Labs  Lab 09/21/19 0735 09/22/19 1010 09/23/19 0624 09/24/19 0250 09/25/19 0202  AST 71* 90* 63* 43* 44*  ALT 27 38 34 32 36  ALKPHOS 48 52 52 59 61  BILITOT 0.9 1.0 0.7 1.1 0.6  PROT 7.0 7.3 6.6 7.0 6.5  ALBUMIN 3.6 3.7 3.3* 3.5 3.1*   No results for input(s): LIPASE, AMYLASE in the last 168 hours. No results for input(s): AMMONIA in the last 168 hours. Coagulation profile No results for input(s): INR, PROTIME in the last 168 hours. COVID-19 Labs  Recent Labs    09/23/19 0624 09/24/19 0250 09/25/19 0202  DDIMER 1.10* 0.97* 1.03*  CRP 11.5* 10.7* 6.4*    Lab Results  Component Value Date   SARSCOV2NAA POSITIVE (A) 09/21/2019    CBC: Recent Labs  Lab 09/21/19 0735 09/22/19 1010 09/23/19 0624 09/24/19 0250 09/25/19 0202  WBC 4.5 5.0 7.0 4.3 7.6  NEUTROABS 3.6 3.9 6.1 3.5 6.2  HGB 11.5* 12.3* 12.8* 12.6* 12.0*  HCT 34.8* 37.8* 39.3 39.1 37.4*  MCV 93.0 95.7 95.9 95.6 96.4  PLT 216 251 298 329 339   Cardiac Enzymes: No results for input(s): CKTOTAL, CKMB, CKMBINDEX, TROPONINI in the last 168 hours. BNP (last 3 results) No results for input(s): PROBNP in the last 8760 hours. CBG: Recent Labs  Lab 09/24/19 0819 09/24/19 1127 09/24/19 1658 09/24/19 2147 09/25/19 0746  GLUCAP 102* 138* 238* 70 89   D-Dimer: Recent Labs    09/24/19 0250 09/25/19 0202  DDIMER 0.97* 1.03*   Hgb A1c: Recent Labs    09/23/19 0624  HGBA1C 6.0*   Lipid Profile: No results for input(s): CHOL, HDL, LDLCALC, TRIG, CHOLHDL, LDLDIRECT in the last 72 hours. Thyroid function studies: No results for input(s): TSH, T4TOTAL, T3FREE, THYROIDAB in the last 72 hours.  Invalid input(s): FREET3 Anemia work up: No results for input(s): VITAMINB12, FOLATE, FERRITIN, TIBC, IRON, RETICCTPCT in  the last 72 hours. Sepsis Labs: Recent Labs  Lab 09/21/19 0735 09/21/19 1030 09/22/19 1010 09/23/19 0624 09/24/19 0250 09/25/19 0202  PROCALCITON 0.32  --   --   --   --   --   WBC 4.5  --  5.0 7.0 4.3 7.6  LATICACIDVEN 2.0* 0.8  --   --   --   --    Microbiology Recent Results (from the past 240 hour(s))  Blood Culture (routine x 2)     Status: None (Preliminary result)   Collection Time: 09/21/19  7:35 AM   Specimen: BLOOD  Result Value Ref Range Status   Specimen Description BLOOD RIGHT  FA  Final   Special Requests   Final    BOTTLES DRAWN AEROBIC AND ANAEROBIC Blood Culture adequate volume   Culture   Final    NO GROWTH 4 DAYS Performed at [facility], [address] [place], [place] [address]    Report Status PENDING  Incomplete  Blood Culture (routine x 2)     Status: None (Preliminary result)   Collection Time: 09/21/19  7:35 AM   Specimen: BLOOD  Result Value Ref Range Status   Specimen Description BLOOD RIGHT FA  Final   Special Requests   Final    BOTTLES DRAWN AEROBIC AND ANAEROBIC Blood Culture results may not be optimal due to an excessive volume of blood received in culture bottles   Culture   Final    NO GROWTH 4 DAYS Performed at [facility], [address] [place], [place] [address]    Report Status PENDING  Incomplete  SARS Coronavirus 2 by RT PCR (hospital order, performed in [facility] hospital lab) Nasopharyngeal Nasopharyngeal Swab     Status: Abnormal   Collection Time: 09/21/19  7:35 AM   Specimen: Nasopharyngeal Swab  Result Value Ref Range Status   SARS Coronavirus 2 POSITIVE (A) NEGATIVE Final    Comment: RESULT CALLED TO, READ BACK BY AND VERIFIED WITH:  Theophilus Bones AT 0849 09/21/2019 SDR (NOTE) If result is NEGATIVE SARS-CoV-2 target nucleic acids are NOT DETECTED. The SARS-CoV-2 RNA is generally detectable in upper and lower  respiratory specimens during the acute phase of infection. The lowest  concentration of  SARS-CoV-2 viral copies this assay can detect is 250  copies / mL. A negative result does not preclude SARS-CoV-2 infection  and should not be used as the sole basis for treatment or other  patient management decisions.  A negative result may occur with  improper specimen collection / handling, submission of specimen other  than nasopharyngeal swab, presence of viral mutation(s) within the  areas targeted by this assay, and inadequate number of viral copies  (<250 copies / mL). A negative result must be combined with clinical  observations, patient history, and epidemiological information. If result is POSITIVE SARS-CoV-2 target nucleic acids are DETECTED. The  SARS-CoV-2 RNA is generally detectable in upper and lower  respiratory specimens during the acute phase of infection.  Positive  results are indicative of active infection with SARS-CoV-2.  Clinical  correlation with patient history and other diagnostic information is  necessary to determine patient infection status.  Positive results do  not rule out bacterial infection or co-infection with other viruses. If result is PRESUMPTIVE POSTIVE SARS-CoV-2 nucleic acids MAY BE PRESENT.   A presumptive positive result was obtained on the submitted specimen  and confirmed on repeat testing.  While 2019 novel coronavirus  (SARS-CoV-2) nucleic acids may be present in the submitted sample  additional confirmatory testing may be necessary for epidemiological  and / or clinical management purposes  to differentiate between  SARS-CoV-2 and other Sarbecovirus currently known to infect humans.  If clinically indicated additional testing with an alternate test  methodology [phone]) is a dvised. The SARS-CoV-2 RNA is generally  detectable in upper and lower respiratory specimens during the acute  phase of infection. The expected result is Negative. Fact Sheet for Patients:  BoilerBrush.com.cy Fact Sheet for Healthcare  Providers: https://pope.com/ This test is not yet approved or cleared by the [place] FDA and has been authorized for detection and/or diagnosis of SARS-CoV-2 by FDA under an  Emergency Use Authorization (EUA).  This EUA will remain in effect (meaning this test can be used) for the duration of the COVID-19 declaration under Section 564(b)(1) of the Act, 21 U.S.C. section 360bbb-3(b)(1), unless the authorization is terminated or revoked sooner. Performed at [facility], [address] [place], [place] [address]      Medications:   . cholecalciferol  1,000 Units Oral Daily  . dexamethasone (DECADRON) injection  6 mg Intravenous Q24H  . DULoxetine  120 mg Oral Daily  . enoxaparin (LOVENOX) injection  40 mg Subcutaneous Q24H  . gabapentin  200 mg Oral TID  . insulin aspart  0-15 Units Subcutaneous TID WC  . insulin aspart  0-5 Units Subcutaneous QHS  . insulin aspart  3 Units Subcutaneous TID WC  . lubiprostone  24 mcg Oral BID WC  . pantoprazole  40 mg Oral Daily  . Ensure Max Protein  11 oz Oral BID  . QUEtiapine  200 mg Oral Q1500  . sodium chloride flush  3 mL Intravenous Q12H  . tamsulosin  0.4 mg Oral Daily  . traZODone  50 mg Oral QHS  . vitamin C  500 mg Oral Daily  . zinc sulfate  220 mg Oral Daily   Continuous Infusions: . sodium chloride    . remdesivir 100 mg in NS 250 mL 100 mg (09/24/19 1059)     LOS: 4 days   Charlynne Cousins  [facility]  09/25/2019, 8:07 AM

## 2019-09-25 NOTE — Progress Notes (Signed)
Sister [name] called and updated. All questions answered!Marland Kitchen

## 2019-09-25 NOTE — Progress Notes (Signed)
Spoke with patient's sister, updated her on patient's condition and answered any questions she had.  

## 2019-09-26 LAB — CBC WITH DIFFERENTIAL/PLATELET
Abs Immature Granulocytes: 0.08 10*3/uL — ABNORMAL HIGH (ref 0.00–0.07)
Basophils Absolute: 0 10*3/uL (ref 0.0–0.1)
Basophils Relative: 0 %
Eosinophils Absolute: 0 10*3/uL (ref 0.0–0.5)
Eosinophils Relative: 0 %
HCT: 38.2 % — ABNORMAL LOW (ref 39.0–52.0)
Hemoglobin: 12.2 g/dL — ABNORMAL LOW (ref 13.0–17.0)
Immature Granulocytes: 1 %
Lymphocytes Relative: 11 %
Lymphs Abs: 0.8 10*3/uL (ref 0.7–4.0)
MCH: 30.9 pg (ref 26.0–34.0)
MCHC: 31.9 g/dL (ref 30.0–36.0)
MCV: 96.7 fL (ref 80.0–100.0)
Monocytes Absolute: 0.4 10*3/uL (ref 0.1–1.0)
Monocytes Relative: 5 %
Neutro Abs: 6.2 10*3/uL (ref 1.7–7.7)
Neutrophils Relative %: 83 %
Platelets: 403 10*3/uL — ABNORMAL HIGH (ref 150–400)
RBC: 3.95 MIL/uL — ABNORMAL LOW (ref 4.22–5.81)
RDW: 12.2 % (ref 11.5–15.5)
WBC: 7.5 10*3/uL (ref 4.0–10.5)
nRBC: 0 % (ref 0.0–0.2)

## 2019-09-26 LAB — COMPREHENSIVE METABOLIC PANEL
ALT: 32 U/L (ref 0–44)
AST: 31 U/L (ref 15–41)
Albumin: 3.4 g/dL — ABNORMAL LOW (ref 3.5–5.0)
Alkaline Phosphatase: 67 U/L (ref 38–126)
Anion gap: 11 (ref 5–15)
BUN: 35 mg/dL — ABNORMAL HIGH (ref 8–23)
CO2: 26 mmol/L (ref 22–32)
Calcium: 9.4 mg/dL (ref 8.9–10.3)
Chloride: 101 mmol/L (ref 98–111)
Creatinine, Ser: 0.8 mg/dL (ref 0.61–1.24)
GFR calc Af Amer: >60 mL/min (ref >60)
GFR calc non Af Amer: >60 mL/min (ref >60)
Glucose, Bld: 119 mg/dL — ABNORMAL HIGH (ref 70–99)
Potassium: 5.1 mmol/L (ref 3.5–5.1)
Sodium: 138 mmol/L (ref 135–145)
Total Bilirubin: 0.8 mg/dL (ref 0.3–1.2)
Total Protein: 6.6 g/dL (ref 6.5–8.1)

## 2019-09-26 LAB — GLUCOSE, CAPILLARY
Glucose-Capillary: 69 mg/dL — ABNORMAL LOW (ref 70–99)
Glucose-Capillary: 106 mg/dL — ABNORMAL HIGH (ref 70–99)
Glucose-Capillary: 162 mg/dL — ABNORMAL HIGH (ref 70–99)

## 2019-09-26 LAB — CULTURE, BLOOD (ROUTINE X 2)
Culture: NO GROWTH
Culture: NO GROWTH
Special Requests: ADEQUATE

## 2019-09-26 LAB — MAGNESIUM: Magnesium: 1.8 mg/dL (ref 1.7–2.4)

## 2019-09-26 LAB — C-REACTIVE PROTEIN: CRP: 6.9 mg/dL — ABNORMAL HIGH (ref <1.0)

## 2019-09-26 LAB — D-DIMER, QUANTITATIVE: D-Dimer, Quant: 1.69 ug/mL-FEU — ABNORMAL HIGH (ref 0.00–0.50)

## 2019-09-26 LAB — BRAIN NATRIURETIC PEPTIDE: B Natriuretic Peptide: 27.2 pg/mL (ref 0.0–100.0)

## 2019-09-26 MED ORDER — HALOPERIDOL LACTATE 5 MG/ML IJ SOLN
5.0000 mg | Freq: Once | INTRAMUSCULAR | Status: AC
  Administered 2019-09-26: 13:00:00 5 mg via INTRAMUSCULAR
  Filled 2019-09-26: qty 1

## 2019-09-26 MED ORDER — HALOPERIDOL 5 MG PO TABS
5.0000 mg | ORAL_TABLET | Freq: Once | ORAL | Status: AC
  Filled 2019-09-26: qty 1

## 2019-09-26 MED ORDER — HALOPERIDOL LACTATE 5 MG/ML IJ SOLN
2.0000 mg | Freq: Four times a day (QID) | INTRAMUSCULAR | Status: DC | PRN
  Administered 2019-09-26: 2 mg via INTRAMUSCULAR
  Filled 2019-09-26: qty 1

## 2019-09-26 MED ORDER — DEXAMETHASONE 6 MG PO TABS
6.0000 mg | ORAL_TABLET | Freq: Every day | ORAL | Status: DC
  Administered 2019-09-26 – 2019-09-28 (×3): 6 mg via ORAL
  Filled 2019-09-26 (×3): qty 1

## 2019-09-26 NOTE — Progress Notes (Signed)
[facility] PROGRESS NOTE    Progress Note  Aaron Foster  WGY:[id number] DOB: [birth year] DOA: 09/21/2019 PCP: Steele Sizer, MD   Brief Narrative:   Aaron Foster is an 73 y.o. male past medical history significant for mental retardation nonverbal depression anxiety diagnosed with COVID-19 4 days prior to admission comes in with progressive shortness of breath and fever.  Cannot provide history since most of the history was obtained from the chart.  Given several doses of Ativan, CT scan suspected for lung mass or also pneumonia.  Assessment/Plan:   Acute respiratory failure with hypoxia due to Pneumonia due to COVID-19 virus And is satting greater than 93% 1 to 2 L of oxygen. [name] complete his course of IV remdesivir, will continue steroids vitamin C and zinc.  Incidental lung mass: CT scan showed a 17 x 11 mm subpleural mass in the posterior left lung apex concerning for neoplasm. Unlikely a candidate for extensive work-up will need further work-up as an outpatient.  Acute confusional state/mental retardation: Continues to be calm requiring no medications. Continue sitter at bedside.  Ethics: The previous physician had an extensive discussion with his sister on 09/22/2019 in the morning, she relates that he is a DNR, she desires no feeding tube or extreme measures. She wanted continue gentle medical treatment and if the patient declines can move towards comfort care.  DVT prophylaxis: lovenox Family Communication:none Disposition Plan/Barrier to D/C: Back to facility on 09/27/2019 Code Status:     Code Status Orders  (From admission, onward)         Start     Ordered   09/21/19 2121  Do not attempt resuscitation (DNR)  Continuous    Question Answer Comment  In the event of cardiac or respiratory ARREST Do not call a "code blue"   In the event of cardiac or respiratory ARREST Do not perform Intubation, CPR, defibrillation or ACLS   In the event of  cardiac or respiratory ARREST Use medication by any route, position, wound care, and other measures to relive pain and suffering. May use oxygen, suction and manual treatment of airway obstruction as needed for comfort.      09/21/19 2122        Code Status History    Date Active Date Inactive Code Status Order ID Comments User Context   09/21/2019 1051 09/21/2019 2051 DNR [id number]  Earleen Newport, MD ED   Advance Care Planning Activity        IV Access:    Peripheral IV   Procedures and diagnostic studies:   No results found.   Medical Consultants:    None.  Anti-Infectives:   IV remdesivir.  Subjective:    Aaron Foster nonverbal.  Objective:    Vitals:   09/25/19 2200 09/26/19 0000 09/26/19 0500 09/26/19 0531  BP: 100/64 (!) 97/45 (!) 131/109 (!) 131/109  Pulse:    (!) 125  Resp:    18  Temp:   98 F (36.7 C)   TempSrc:   Axillary   SpO2:    (!) 87%  Weight:       SpO2: (!) 87 %(Pulled off sensor. Couldnt put back) O2 Flow Rate (L/min): 2 L/min   Intake/Output Summary (Last 24 hours) at 09/26/2019 0823 Last data filed at 09/26/2019 0300 Gross per 24 hour  Intake 493 ml  Output 1401 ml  Net -908 ml   Filed Weights   09/22/19 0424  Weight: 52.6 kg    Exam:  General exam: In no acute distress. Respiratory system: Good air movement and diffuse crackles. Cardiovascular system: S1 & S2 heard, RRR. No JVD.  Gastrointestinal system: Abdomen is nondistended, soft and nontender.  Extremities: No pedal edema. Skin: No rashes, lesions or ulcers  Data Reviewed:    Labs: Basic Metabolic Panel: Recent Labs  Lab 09/22/19 1010 09/23/19 0624 09/24/19 0250 09/25/19 0202 09/26/19 0200  NA 144 139 138 140 138  K 4.4 4.5 4.6 4.3 5.1  CL 105 100 100 103 101  CO2 GLUCOSE 127* 175* 131* 142* 119*  BUN 32* 28* 32* 32* 35*  CREATININE 0.91 0.81 0.80 0.80 0.80  CALCIUM 8.5* 8.7* 8.5* 8.8* 9.4  MG 2.2 2.0 2.2 2.0 1.8    GFR Estimated Creatinine Clearance: 62.1 mL/min (by C-G formula based on SCr of 0.8 mg/dL). Liver Function Tests: Recent Labs  Lab 09/22/19 1010 09/23/19 0624 09/24/19 0250 09/25/19 0202 09/26/19 0200  AST 90* 63* 43* 44* 31  ALT 38 34 32 36 32  ALKPHOS 52 52 59 61 67  BILITOT 1.0 0.7 1.1 0.6 0.8  PROT 7.3 6.6 7.0 6.5 6.6  ALBUMIN 3.7 3.3* 3.5 3.1* 3.4*   No results for input(s): LIPASE, AMYLASE in the last 168 hours. No results for input(s): AMMONIA in the last 168 hours. Coagulation profile No results for input(s): INR, PROTIME in the last 168 hours. COVID-19 Labs  Recent Labs    09/24/19 0250 09/25/19 0202 09/26/19 0200  DDIMER 0.97* 1.03* 1.69*  CRP 10.7* 6.4* 6.9*    Lab Results  Component Value Date   SARSCOV2NAA POSITIVE (A) 09/21/2019    CBC: Recent Labs  Lab 09/22/19 1010 09/23/19 0624 09/24/19 0250 09/25/19 0202 09/26/19 0200  WBC 5.0 7.0 4.3 7.6 7.5  NEUTROABS 3.9 6.1 3.5 6.2 6.2  HGB 12.3* 12.8* 12.6* 12.0* 12.2*  HCT 37.8* 39.3 39.1 37.4* 38.2*  MCV 95.7 95.9 95.6 96.4 96.7  PLT 251 298 329 339 403*   Cardiac Enzymes: No results for input(s): CKTOTAL, CKMB, CKMBINDEX, TROPONINI in the last 168 hours. BNP (last 3 results) No results for input(s): PROBNP in the last 8760 hours. CBG: Recent Labs  Lab 09/24/19 2147 09/25/19 0746 09/25/19 1235 09/25/19 1757 09/25/19 2129  GLUCAP 70 89 71 231* 116*   D-Dimer: Recent Labs    09/25/19 0202 09/26/19 0200  DDIMER 1.03* 1.69*   Hgb A1c: No results for input(s): HGBA1C in the last 72 hours. Lipid Profile: No results for input(s): CHOL, HDL, LDLCALC, TRIG, CHOLHDL, LDLDIRECT in the last 72 hours. Thyroid function studies: No results for input(s): TSH, T4TOTAL, T3FREE, THYROIDAB in the last 72 hours.  Invalid input(s): FREET3 Anemia work up: No results for input(s): VITAMINB12, FOLATE, FERRITIN, TIBC, IRON, RETICCTPCT in the last 72 hours. Sepsis Labs: Recent Labs  Lab 09/21/19  0735 09/21/19 1030  09/23/19 0624 09/24/19 0250 09/25/19 0202 09/26/19 0200  PROCALCITON 0.32  --   --   --   --   --   --   WBC 4.5  --    < > 7.0 4.3 7.6 7.5  LATICACIDVEN 2.0* 0.8  --   --   --   --   --    < > = values in this interval not displayed.   Microbiology Recent Results (from the past 240 hour(s))  Blood Culture (routine x 2)     Status: None   Collection Time: 09/21/19  7:35 AM   Specimen: BLOOD  Result Value Ref Range Status   Specimen Description BLOOD RIGHT FA  Final   Special Requests   Final    BOTTLES DRAWN AEROBIC AND ANAEROBIC Blood Culture adequate volume   Culture   Final    NO GROWTH 5 DAYS Performed at [facility], [address] [place], [place] [address]    Report Status 09/26/2019 FINAL  Final  Blood Culture (routine x 2)     Status: None   Collection Time: 09/21/19  7:35 AM   Specimen: BLOOD  Result Value Ref Range Status   Specimen Description BLOOD RIGHT FA  Final   Special Requests   Final    BOTTLES DRAWN AEROBIC AND ANAEROBIC Blood Culture results may not be optimal due to an excessive volume of blood received in culture bottles   Culture   Final    NO GROWTH 5 DAYS Performed at [facility], [address] [place], [place] [address]    Report Status 09/26/2019 FINAL  Final  SARS Coronavirus 2 by RT PCR (hospital order, performed in [facility] lab) Nasopharyngeal Nasopharyngeal Swab     Status: Abnormal   Collection Time: 09/21/19  7:35 AM   Specimen: Nasopharyngeal Swab  Result Value Ref Range Status   SARS Coronavirus 2 POSITIVE (A) NEGATIVE Final    Comment: RESULT CALLED TO, READ BACK BY AND VERIFIED WITH:  Theophilus BonesALLIE YOW AT 0849 09/21/2019 SDR (NOTE) If result is NEGATIVE SARS-CoV-2 target nucleic acids are NOT DETECTED. The SARS-CoV-2 RNA is generally detectable in upper and lower  respiratory specimens during the acute phase of infection. The lowest  concentration of SARS-CoV-2 viral copies this  assay can detect is 250  copies / mL. A negative result does not preclude SARS-CoV-2 infection  and should not be used as the sole basis for treatment or other  patient management decisions.  A negative result may occur with  improper specimen collection / handling, submission of specimen other  than nasopharyngeal swab, presence of viral mutation(s) within the  areas targeted by this assay, and inadequate number of viral copies  (<250 copies / mL). A negative result must be combined with clinical  observations, patient history, and epidemiological information. If result is POSITIVE SARS-CoV-2 target nucleic acids are DETECTED. The  SARS-CoV-2 RNA is generally detectable in upper and lower  respiratory specimens during the acute phase of infection.  Positive  results are indicative of active infection with SARS-CoV-2.  Clinical  correlation with patient history and other diagnostic information is  necessary to determine patient infection status.  Positive results do  not rule out bacterial infection or co-infection with other viruses. If result is PRESUMPTIVE POSTIVE SARS-CoV-2 nucleic acids MAY BE PRESENT.   A presumptive positive result was obtained on the submitted specimen  and confirmed on repeat testing.  While 2019 novel coronavirus  (SARS-CoV-2) nucleic acids may be present in the submitted sample  additional confirmatory testing may be necessary for epidemiological  and / or clinical management purposes  to differentiate between  SARS-CoV-2 and other Sarbecovirus currently known to infect humans.  If clinically indicated additional testing with an alternate test  methodology [phone]([id number]) is a dvised. The SARS-CoV-2 RNA is generally  detectable in upper and lower respiratory specimens during the acute  phase of infection. The expected result is Negative. Fact Sheet for Patients:  BoilerBrush.com.cyhttps://www.fda.gov/media/136312/download Fact Sheet for Healthcare Providers:  https://pope.com/https://www.fda.gov/media/136313/download This test is not yet approved or cleared by the [place] FDA and has been  authorized for detection and/or diagnosis of SARS-CoV-2 by FDA under an Emergency Use Authorization (EUA).  This EUA will remain in effect (meaning this test can be used) for the duration of the COVID-19 declaration under Section 564(b)(1) of the Act, 21 U.S.C. section 360bbb-3(b)(1), unless the authorization is terminated or revoked sooner. Performed at [facility], [address] [place], [place] [address]      Medications:   . cholecalciferol  1,000 Units Oral Daily  . dexamethasone (DECADRON) injection  6 mg Intravenous Q24H  . DULoxetine  120 mg Oral Daily  . enoxaparin (LOVENOX) injection  40 mg Subcutaneous Q24H  . gabapentin  200 mg Oral TID  . insulin aspart  0-15 Units Subcutaneous TID WC  . insulin aspart  0-5 Units Subcutaneous QHS  . insulin aspart  3 Units Subcutaneous TID WC  . lubiprostone  24 mcg Oral BID WC  . pantoprazole  40 mg Oral Daily  . Ensure Max Protein  11 oz Oral BID  . QUEtiapine  200 mg Oral Q1500  . sodium chloride flush  3 mL Intravenous Q12H  . tamsulosin  0.4 mg Oral Daily  . traZODone  50 mg Oral QHS  . vitamin C  500 mg Oral Daily  . zinc sulfate  220 mg Oral Daily   Continuous Infusions: . sodium chloride       LOS: 5 days   Marinda Elk  [facility]  09/26/2019, 8:23 AM

## 2019-09-27 ENCOUNTER — Encounter (HOSPITAL_COMMUNITY): Payer: Self-pay

## 2019-09-27 LAB — GLUCOSE, CAPILLARY
Glucose-Capillary: 128 mg/dL — ABNORMAL HIGH (ref 70–99)
Glucose-Capillary: 129 mg/dL — ABNORMAL HIGH (ref 70–99)
Glucose-Capillary: 134 mg/dL — ABNORMAL HIGH (ref 70–99)
Glucose-Capillary: 135 mg/dL — ABNORMAL HIGH (ref 70–99)
Glucose-Capillary: 152 mg/dL — ABNORMAL HIGH (ref 70–99)

## 2019-09-27 LAB — D-DIMER, QUANTITATIVE: D-Dimer, Quant: 1.19 ug/mL-FEU — ABNORMAL HIGH (ref 0.00–0.50)

## 2019-09-27 LAB — C-REACTIVE PROTEIN: CRP: 12.4 mg/dL — ABNORMAL HIGH (ref <1.0)

## 2019-09-27 NOTE — Progress Notes (Signed)
Spoke with patient's sister, updated her on patient's condition and answered any questions she had.  

## 2019-09-27 NOTE — Discharge Summary (Addendum)
Physician Discharge Summary  Aaron Foster ZOX:[id number] DOB: 1946-04-13 DOA: 09/21/2019  PCP: Alba Cory, MD  Admit date: 09/21/2019 Discharge date: 09/28/2019  Admitted From: Assisted facility Disposition:    Recommendations for Outpatient Follow-up:  1. Follow up with PCP in 1-2 weeks 2. Please obtain BMP/CBC in one week 3. He will need a repeated CT scan in 6 weeks to evaluate incidental lung mass on CT that measures 17 x 11 mm subpleural in the posterior left lung apex. 4. Palliative care to discuss with the family  future goals of care  Home Health:No Equipment/Devices:None  Discharge Condition:Stable CODE STATUS:Full Diet recommendation: Heart Healthy  Brief/Interim Summary: 73 y.o. male past medical history significant for mental retardation nonverbal depression anxiety diagnosed with COVID-19 4 days prior to admission comes in with progressive shortness of breath and fever.  Cannot provide history since most of the history was obtained from the chart.  Given several doses of Ativan, CT scan suspected for lung mass or also pneumonia.  Discharge Diagnoses:  Principal Problem:   Pneumonia due to COVID-19 virus Active Problems:   Chronic pain   Intellectual disability   Adult behavior problem   Acute respiratory failure due to COVID-19 [facility]) Acute respiratory failure with hypoxia due to COVID-19 viral pneumonitis: He was placed on supplemental oxygen.  Started on IV remdesivir and steroids and vitamin C and zinc. He was weaned to room air and able to complete his course of IV remdesivir and steroid in house.  Incidental lung mass: CT scan on admission showed a 17 x 11 mm subpleural mass in the posterior left lung Concerning for neoplasm. The patient is unlikely a candidate for extensive work-up, he will follow-up with PCP as an outpatient will need a CT scan repeated in 6 to 8 weeks.  Acute confusional state/mental retardation: Patient initially was agitated  and it was hard to evaluate his mentation. He was stabilized with Haldol.  Ethics: The previous physician taking care of him and I had an assisted discussion with her sister about his DNR status and aggressive measures. The sister related that she wanted conservative measures she did not want him to be intubated or any intervention that would cause him any additional medical distress he was treated conservatively with IV medications and he remained stable. It is to note that palliative care of care should probably meet with the family to address future goals of care.   Discharge Instructions  Discharge Instructions    Diet - low sodium heart healthy   Complete by: As directed    Increase activity slowly   Complete by: As directed      Allergies as of 09/28/2019   No Known Allergies     Medication List    TAKE these medications   acetaminophen 500 MG tablet Commonly known as: Acetaminophen Extra Strength TAKE 2 TABLETS BY MOUTH 3 TIMES DAILY FOR PAIN What changed:   how much to take  how to take this  when to take this   Amitiza 24 MCG capsule Generic drug: lubiprostone TAKE 1 CAPSULE BY MOUTH 2 TIMES PER DAY WITH MEAL What changed: See the new instructions.   Aspirin Low Dose 81 MG EC tablet Generic drug: aspirin TAKE 1 TABLET BY MOUTH ONCE EVERY DAY FOR CIRCULATION What changed: See the new instructions.   carbamide peroxide 6.5 % OTIC solution Commonly known as: DEBROX Place 4 drops into both ears once a week. On Friday   D3-1000 25 MCG (1000 UT) tablet  Generic drug: Cholecalciferol TAKE 1 TABLET BY MOUTH ONCE DAILY FOR SUPPLEMENT. What changed: See the new instructions.   DULoxetine 60 MG capsule Commonly known as: CYMBALTA Take 120 mg by mouth daily.   esomeprazole 20 MG capsule Commonly known as: NEXIUM TAKE 1 CAPSULE BY MOUTH AT LEAST 30 MINUTES BEFORE FOOD, ONCE DAILY FOR REFLUX What changed: See the new instructions.   feeding supplement  Liqd Take 237 mLs by mouth 3 (three) times daily between meals.   gabapentin 100 MG capsule Commonly known as: NEURONTIN Take 200 mg by mouth 3 (three) times daily.   ketoconazole 2 % shampoo Commonly known as: NIZORAL SHAMPOO HAIR 3 TIMES WEEKLY FOR FUNGAL INFECTION   ketoconazole 2 % cream Commonly known as: NIZORAL Apply topically daily.   LORazepam 0.5 MG tablet Commonly known as: Ativan Take 1 tablet (0.5 mg total) by mouth every 8 (eight) hours as needed for anxiety.   orphenadrine 100 MG tablet Commonly known as: NORFLEX Take 1 tablet (100 mg total) by mouth 2 (two) times daily.   OVER THE COUNTER MEDICATION Take 5 drops by mouth daily. CBD oil   QUEtiapine 200 MG 24 hr tablet Commonly known as: SEROQUEL XR Take 200 mg by mouth daily in the afternoon.   Refresh Liquigel 1 % Gel Generic drug: Carboxymethylcellulose Sodium INSTILL 1 DROP INTO EACH EYE 3 TIMES A DAY What changed: See the new instructions.   tamsulosin 0.4 MG Caps capsule Commonly known as: FLOMAX Take 1 capsule (0.4 mg total) by mouth daily.   traZODone 50 MG tablet Commonly known as: DESYREL TAKE 1 TABLET BY MOUTH EACH DAY AT BEDTIME FOR SLEEP What changed: See the new instructions.      Follow-up Information    Alba Cory, MD. Schedule an appointment as soon as possible for a visit in 1 week(s).   Specialty: Family Medicine Why: 1. Follow up with PCP in 1-2 weeks 2. Please obtain BMP/CBC in one week 3. He will need a repeated CT scan in 6 weeks to evaluate incidental lung mass on CT that measures 17 x 11 mm subpleural in the posterior left lung apex. Contact information: [address] [place] [address] [phone]          No Known Allergies  Consultations:  None   Procedures/Studies: Dg Lumbar Spine 2-3 Views  Result Date: 09/21/2019 CLINICAL DATA:  Low back pain EXAM: LUMBAR SPINE - 2-3 VIEW COMPARISON:  CT abdomen 06/23/2013 FINDINGS: There is a  chronic L1 vertebral body compression fracture unchanged compared with 06/23/2013. No other acute fracture. Alignment is normal. Degenerative disease with disc height loss throughout the lumbar spine. Bilateral facet arthropathy at L4-5 and L5-S1. Incidental note made of excreted IV contrast material within the renal collecting system from CT angiogram of the chest performed earlier same day. IMPRESSION: 1.  No acute osseous injury of the lumbar spine. 2. Lumbar spine spondylosis as described above. Electronically Signed   By: Elige Ko   On: 09/21/2019 11:10   Ct Angio Chest Pe W And/or Wo Contrast  Result Date: 09/21/2019 CLINICAL DATA:  Shortness of breath. EXAM: CT ANGIOGRAPHY CHEST WITH CONTRAST TECHNIQUE: Multidetector CT imaging of the chest was performed using the standard protocol during bolus administration of intravenous contrast. Multiplanar CT image reconstructions and MIPs were obtained to evaluate the vascular anatomy. CONTRAST:  75mL OMNIPAQUE IOHEXOL 350 MG/ML SOLN COMPARISON:  None. FINDINGS: Cardiovascular: Satisfactory opacification of the pulmonary arteries to the segmental level. No evidence  of pulmonary embolism. Normal heart size. No pericardial effusion. Mediastinum/Nodes: No enlarged mediastinal, hilar, or axillary lymph nodes. Thyroid gland, trachea, and esophagus demonstrate no significant findings. Lungs/Pleura: No pneumothorax or pleural effusion is noted. Multiple airspace opacities are noted in both lungs, right greater than left. This is concerning for multifocal pneumonia. 17 x 11 mm subpleural mass is noted posteriorly in the left lung apex concerning for possible neoplasm. Upper Abdomen: No acute abnormality. Musculoskeletal: No chest wall abnormality. No acute or significant osseous findings. Review of the MIP images confirms the above findings. IMPRESSION: 17 x 11 mm subpleural mass is noted posteriorly in the left lung apex concerning for possible neoplasm; PET scan is  recommended for further evaluation. No definite evidence of pulmonary embolus. Multiple airspace opacities are noted bilaterally, right greater than left, consistent with multifocal pneumonia. Aortic Atherosclerosis (ICD10-I70.0). Electronically Signed   By: Lupita Raider M.D.   On: 09/21/2019 09:52    Subjective: No complaints.  Discharge Exam: Vitals:   09/28/19 0825 09/28/19 1300  BP: (!) 98/48 (!) 91/59  Pulse: (!) 105 92  Resp: 18   Temp: 98.1 F (36.7 C) 98.8 F (37.1 C)  SpO2: 90% 91%   Vitals:   09/27/19 1931 09/28/19 0358 09/28/19 0825 09/28/19 1300  BP:  (!) 115/59 (!) 98/48 (!) 91/59  Pulse: 75 88 (!) 105 92  Resp: Temp: 97.8 F (36.6 C) 97.6 F (36.4 C) 98.1 F (36.7 C) 98.8 F (37.1 C)  TempSrc: Axillary Axillary Axillary Axillary  SpO2: 93% 94% 90% 91%  Weight:        General: Pt is alert, awake, not in acute distress Cardiovascular: RRR, S1/S2 +, no rubs, no gallops Respiratory: CTA bilaterally, no wheezing, no rhonchi Abdominal: Soft, NT, ND, bowel sounds + Extremities: no edema, no cyanosis    The results of significant diagnostics from this hospitalization (including imaging, microbiology, ancillary and laboratory) are listed below for reference.     Microbiology: Recent Results (from the past 240 hour(s))  Blood Culture (routine x 2)     Status: None   Collection Time: 09/21/19  7:35 AM   Specimen: BLOOD  Result Value Ref Range Status   Specimen Description BLOOD RIGHT FA  Final   Special Requests   Final    BOTTLES DRAWN AEROBIC AND ANAEROBIC Blood Culture adequate volume   Culture   Final    NO GROWTH 5 DAYS Performed at [facility], [address] [place], [place] [address]    Report Status 09/26/2019 FINAL  Final  Blood Culture (routine x 2)     Status: None   Collection Time: 09/21/19  7:35 AM   Specimen: BLOOD  Result Value Ref Range Status   Specimen Description BLOOD RIGHT FA  Final   Special Requests    Final    BOTTLES DRAWN AEROBIC AND ANAEROBIC Blood Culture results may not be optimal due to an excessive volume of blood received in culture bottles   Culture   Final    NO GROWTH 5 DAYS Performed at [facility], [address] [place], [place] [address]    Report Status 09/26/2019 FINAL  Final  SARS Coronavirus 2 by RT PCR (hospital order, performed in [facility] hospital lab) Nasopharyngeal Nasopharyngeal Swab     Status: Abnormal   Collection Time: 09/21/19  7:35 AM   Specimen: Nasopharyngeal Swab  Result Value Ref Range Status   SARS Coronavirus 2 POSITIVE (A) NEGATIVE  Final    Comment: RESULT CALLED TO, READ BACK BY AND VERIFIED WITH:  Arlana Hove AT [id number] 09/21/2019 SDR (NOTE) If result is NEGATIVE SARS-CoV-2 target nucleic acids are NOT DETECTED. The SARS-CoV-2 RNA is generally detectable in upper and lower  respiratory specimens during the acute phase of infection. The lowest  concentration of SARS-CoV-2 viral copies this assay can detect is 250  copies / mL. A negative result does not preclude SARS-CoV-2 infection  and should not be used as the sole basis for treatment or other  patient management decisions.  A negative result may occur with  improper specimen collection / handling, submission of specimen other  than nasopharyngeal swab, presence of viral mutation(s) within the  areas targeted by this assay, and inadequate number of viral copies  (<250 copies / mL). A negative result must be combined with clinical  observations, patient history, and epidemiological information. If result is POSITIVE SARS-CoV-2 target nucleic acids are DETECTED. The  SARS-CoV-2 RNA is generally detectable in upper and lower  respiratory specimens during the acute phase of infection.  Positive  results are indicative of active infection with SARS-CoV-2.  Clinical  correlation with patient history and other diagnostic information is  necessary to determine patient infection status.   Positive results do  not rule out bacterial infection or co-infection with other viruses. If result is PRESUMPTIVE POSTIVE SARS-CoV-2 nucleic acids MAY BE PRESENT.   A presumptive positive result was obtained on the submitted specimen  and confirmed on repeat testing.  While 2019 novel coronavirus  (SARS-CoV-2) nucleic acids may be present in the submitted sample  additional confirmatory testing may be necessary for epidemiological  and / or clinical management purposes  to differentiate between  SARS-CoV-2 and other Sarbecovirus currently known to infect humans.  If clinically indicated additional testing with an alternate test  methodology [phone]) is a dvised. The SARS-CoV-2 RNA is generally  detectable in upper and lower respiratory specimens during the acute  phase of infection. The expected result is Negative. Fact Sheet for Patients:  StrictlyIdeas.no Fact Sheet for Healthcare Providers: BankingDealers.co.za This test is not yet approved or cleared by the [place] FDA and has been authorized for detection and/or diagnosis of SARS-CoV-2 by FDA under an Emergency Use Authorization (EUA).  This EUA will remain in effect (meaning this test can be used) for the duration of the COVID-19 declaration under Section 564(b)(1) of the Act, 21 U.S.C. section 360bbb-3(b)(1), unless the authorization is terminated or revoked sooner. Performed at [facility], [address] [place], [place] [address]      Labs: BNP (last 3 results) Recent Labs    09/24/19 0250 09/25/19 0202 09/26/19 0200  BNP 52.2 33.1 14.7   Basic Metabolic Panel: Recent Labs  Lab 09/22/19 1010 09/23/19 0624 09/24/19 0250 09/25/19 0202 09/26/19 0200  NA 144 139 138 140 138  K 4.4 4.5 4.6 4.3 5.1  CL 105 100 100 103 101  CO2 28 28 25 28 26   GLUCOSE 127* 175* 131* 142* 119*  BUN 32* 28* 32* 32* 35*  CREATININE 0.91 0.81 0.80 0.80 0.80   CALCIUM 8.5* 8.7* 8.5* 8.8* 9.4  MG 2.2 2.0 2.2 2.0 1.8   Liver Function Tests: Recent Labs  Lab 09/22/19 1010 09/23/19 0624 09/24/19 0250 09/25/19 0202 09/26/19 0200  AST 90* 63* 43* 44* 31  ALT 38 34 32 36 32  ALKPHOS 52 52 59 61 67  BILITOT 1.0 0.7 1.1 0.6 0.8  PROT 7.3 6.6  7.0 6.5 6.6  ALBUMIN 3.7 3.3* 3.5 3.1* 3.4*   No results for input(s): LIPASE, AMYLASE in the last 168 hours. No results for input(s): AMMONIA in the last 168 hours. CBC: Recent Labs  Lab 09/22/19 1010 09/23/19 0624 09/24/19 0250 09/25/19 0202 09/26/19 0200  WBC 5.0 7.0 4.3 7.6 7.5  NEUTROABS 3.9 6.1 3.5 6.2 6.2  HGB 12.3* 12.8* 12.6* 12.0* 12.2*  HCT 37.8* 39.3 39.1 37.4* 38.2*  MCV 95.7 95.9 95.6 96.4 96.7  PLT 251 298 329 339 403*   Cardiac Enzymes: No results for input(s): CKTOTAL, CKMB, CKMBINDEX, TROPONINI in the last 168 hours. BNP: Invalid input(s): POCBNP CBG: Recent Labs  Lab 09/27/19 1157 09/27/19 1649 09/27/19 2049 09/28/19 0822 09/28/19 1205  GLUCAP 128* 135* 125* 86 116*   D-Dimer Recent Labs    09/27/19 0340 09/28/19 0353  DDIMER 1.19* 1.71*   Hgb A1c No results for input(s): HGBA1C in the last 72 hours. Lipid Profile No results for input(s): CHOL, HDL, LDLCALC, TRIG, CHOLHDL, LDLDIRECT in the last 72 hours. Thyroid function studies No results for input(s): TSH, T4TOTAL, T3FREE, THYROIDAB in the last 72 hours.  Invalid input(s): FREET3 Anemia work up No results for input(s): VITAMINB12, FOLATE, FERRITIN, TIBC, IRON, RETICCTPCT in the last 72 hours. Urinalysis    Component Value Date/Time   COLORURINE Amber 06/20/2013 1606   APPEARANCEUR Hazy 06/20/2013 1606   LABSPEC 1.039 06/20/2013 1606   PHURINE 5.0 06/20/2013 1606   GLUCOSEU Negative 06/20/2013 1606   HGBUR 1+ 06/20/2013 1606   BILIRUBINUR neg 08/14/2019 1556   BILIRUBINUR 2+ 06/20/2013 1606   KETONESUR 1+ 06/20/2013 1606   PROTEINUR Negative 08/14/2019 1556   PROTEINUR >=500 06/20/2013 1606    UROBILINOGEN 0.2 08/14/2019 1556   NITRITE neg 08/14/2019 1556   NITRITE Negative 06/20/2013 1606   LEUKOCYTESUR Negative 08/14/2019 1556   LEUKOCYTESUR Negative 06/20/2013 1606   Sepsis Labs Invalid input(s): PROCALCITONIN,  WBC,  LACTICIDVEN Microbiology Recent Results (from the past 240 hour(s))  Blood Culture (routine x 2)     Status: None   Collection Time: 09/21/19  7:35 AM   Specimen: BLOOD  Result Value Ref Range Status   Specimen Description BLOOD RIGHT FA  Final   Special Requests   Final    BOTTLES DRAWN AEROBIC AND ANAEROBIC Blood Culture adequate volume   Culture   Final    NO GROWTH 5 DAYS Performed at [facility], [address] [place], [place] [address]    Report Status 09/26/2019 FINAL  Final  Blood Culture (routine x 2)     Status: None   Collection Time: 09/21/19  7:35 AM   Specimen: BLOOD  Result Value Ref Range Status   Specimen Description BLOOD RIGHT FA  Final   Special Requests   Final    BOTTLES DRAWN AEROBIC AND ANAEROBIC Blood Culture results may not be optimal due to an excessive volume of blood received in culture bottles   Culture   Final    NO GROWTH 5 DAYS Performed at [facility], [address] [place], [place] [address]    Report Status 09/26/2019 FINAL  Final  SARS Coronavirus 2 by RT PCR (hospital order, performed in [facility] lab) Nasopharyngeal Nasopharyngeal Swab     Status: Abnormal   Collection Time: 09/21/19  7:35 AM   Specimen: Nasopharyngeal Swab  Result Value Ref Range Status   SARS Coronavirus 2 POSITIVE (A) NEGATIVE Final    Comment: RESULT CALLED TO, READ BACK  BY AND VERIFIED WITH:  Theophilus Bones AT 1583 09/21/2019 SDR (NOTE) If result is NEGATIVE SARS-CoV-2 target nucleic acids are NOT DETECTED. The SARS-CoV-2 RNA is generally detectable in upper and lower  respiratory specimens during the acute phase of infection. The lowest  concentration of SARS-CoV-2 viral copies this assay can detect  is 250  copies / mL. A negative result does not preclude SARS-CoV-2 infection  and should not be used as the sole basis for treatment or other  patient management decisions.  A negative result may occur with  improper specimen collection / handling, submission of specimen other  than nasopharyngeal swab, presence of viral mutation(s) within the  areas targeted by this assay, and inadequate number of viral copies  (<250 copies / mL). A negative result must be combined with clinical  observations, patient history, and epidemiological information. If result is POSITIVE SARS-CoV-2 target nucleic acids are DETECTED. The  SARS-CoV-2 RNA is generally detectable in upper and lower  respiratory specimens during the acute phase of infection.  Positive  results are indicative of active infection with SARS-CoV-2.  Clinical  correlation with patient history and other diagnostic information is  necessary to determine patient infection status.  Positive results do  not rule out bacterial infection or co-infection with other viruses. If result is PRESUMPTIVE POSTIVE SARS-CoV-2 nucleic acids MAY BE PRESENT.   A presumptive positive result was obtained on the submitted specimen  and confirmed on repeat testing.  While 2019 novel coronavirus  (SARS-CoV-2) nucleic acids may be present in the submitted sample  additional confirmatory testing may be necessary for epidemiological  and / or clinical management purposes  to differentiate between  SARS-CoV-2 and other Sarbecovirus currently known to infect humans.  If clinically indicated additional testing with an alternate test  methodology [phone]) is a dvised. The SARS-CoV-2 RNA is generally  detectable in upper and lower respiratory specimens during the acute  phase of infection. The expected result is Negative. Fact Sheet for Patients:  BoilerBrush.com.cy Fact Sheet for Healthcare  Providers: https://pope.com/ This test is not yet approved or cleared by the [place] FDA and has been authorized for detection and/or diagnosis of SARS-CoV-2 by FDA under an Emergency Use Authorization (EUA).  This EUA will remain in effect (meaning this test can be used) for the duration of the COVID-19 declaration under Section 564(b)(1) of the Act, 21 U.S.C. section 360bbb-3(b)(1), unless the authorization is terminated or revoked sooner. Performed at [facility], [address] [place], [place] [address]      Time coordinating discharge: Over 40 minutes  SIGNED:   Marinda Elk, MD  [facility] 09/28/2019, 1:36 PM Pager   If 7PM-7AM, please contact night-coverage [email or web address] Password [email or web address]

## 2019-09-27 NOTE — TOC Progression Note (Signed)
Transition of Care [facility]) - Progression Note    Patient Details  Name: Aaron Foster MRN: [id number] Date of Birth: 1946-02-01  [facility] CM/SW Contact  Loletha Grayer Beverely Pace, RN Phone Number: [phone] (working remotely) 09/27/2019, 11:40 AM  Clinical Narrative:   Case manager contacted Group Home nurse: Gena Fray [phone]. She doesn't have staff until tomorrow to receive patient back. CM notified MD and charge nurse.   Expected Discharge Plan: Group Home Barriers to Discharge: Other (comment)(staffing at group home, will go on 11/23)  Expected Discharge Plan and Services Expected Discharge Plan: [place] arrangements for the past 2 months: Group Home Expected Discharge Date: 09/27/19               DME Arranged: N/A                     Social Determinants of Health (SDOH) Interventions    Readmission Risk Interventions No flowsheet data found.

## 2019-09-28 LAB — GLUCOSE, CAPILLARY
Glucose-Capillary: 141 mg/dL — ABNORMAL HIGH (ref 70–99)
Glucose-Capillary: 125 mg/dL — ABNORMAL HIGH (ref 70–99)
Glucose-Capillary: 86 mg/dL (ref 70–99)
Glucose-Capillary: 116 mg/dL — ABNORMAL HIGH (ref 70–99)
Glucose-Capillary: 129 mg/dL — ABNORMAL HIGH (ref 70–99)

## 2019-09-28 LAB — C-REACTIVE PROTEIN: CRP: 8.6 mg/dL — ABNORMAL HIGH (ref <1.0)

## 2019-09-28 LAB — D-DIMER, QUANTITATIVE: D-Dimer, Quant: 1.71 ug/mL-FEU — ABNORMAL HIGH (ref 0.00–0.50)

## 2019-09-28 MED ORDER — HALOPERIDOL LACTATE 5 MG/ML IJ SOLN
5.0000 mg | Freq: Once | INTRAMUSCULAR | Status: AC
  Administered 2019-09-28: 14:00:00 5 mg via INTRAMUSCULAR
  Filled 2019-09-28: qty 1

## 2019-09-28 MED ORDER — LORAZEPAM 0.5 MG PO TABS: 0.5000 mg | ORAL_TABLET | Freq: Three times a day (TID) | ORAL | 0 refills | Status: AC | PRN

## 2019-09-28 MED ORDER — HALOPERIDOL 5 MG PO TABS
5.0000 mg | ORAL_TABLET | Freq: Once | ORAL | Status: AC
  Filled 2019-09-28: qty 1

## 2019-09-28 MED ORDER — LORAZEPAM 0.5 MG PO TABS: 0.5000 mg | ORAL_TABLET | Freq: Three times a day (TID) | ORAL | 0 refills | Status: DC | PRN

## 2019-09-28 NOTE — [place] FL2 (Signed)
[place] MEDICAID FL2 LEVEL OF CARE SCREENING TOOL     IDENTIFICATION  Patient Name: Aaron Foster Birthdate: 02-19-1946 Sex: male Admission Date (Current Location): 09/21/2019  [facility] and [place] Number:  Producer, television/film/video and Address:  [facility], [address] [place], [place] [address]([facility])      Provider Number: [id number]  Attending Physician Name and Address:  Marinda Elk, MD  Relative Name and Phone Number:       Current Level of Care: Hospital Recommended Level of Care: Other (Comment)(group home) Prior Approval Number:    Date Approved/Denied:   PASRR Number:    Discharge Plan: (group home)    Current Diagnoses: Patient Active Problem List   Diagnosis Date Noted  . Pneumonia due to COVID-19 virus 09/21/2019  . Acute respiratory failure due to COVID-19 (HCC) 09/21/2019  . OSA on CPAP 06/23/2018  . OSA (obstructive sleep apnea) 03/18/2018  . Vitamin D deficiency 04/08/2017  . Scoliosis 02/22/2016  . Recurrent falls 08/04/2015  . Dermatitis seborrheica 06/24/2015  . Chronic constipation 06/24/2015  . Lives in group home 06/24/2015  . Protein calorie malnutrition (HCC) 06/24/2015  . Hearing loss of both ears 06/24/2015  . Wears hearing aid 06/24/2015  . History of iron deficiency anemia 06/24/2015  . Osteoporosis 06/24/2015  . Adult behavior problem 06/24/2015  . Hiatal hernia 06/24/2015  . Hemorrhoid 06/24/2015  . Gait instability 06/24/2015  . DDD (degenerative disc disease), lumbar 06/24/2015  . Nephrolithiasis 06/24/2015  . Hyperlipidemia 06/24/2015  . Compression fracture of L1 lumbar vertebra (HCC) 06/24/2015  . Family history of upper GI bleeding   . BPH with obstruction/lower urinary tract symptoms 06/21/2015  . Chronic pain 06/19/2015  . Acid reflux 06/19/2015  . Intellectual disability 06/19/2015    Orientation RESPIRATION BLADDER Height & Weight        Normal Incontinent  Weight: 52.6 kg Height:     BEHAVIORAL SYMPTOMS/MOOD NEUROLOGICAL BOWEL NUTRITION STATUS      Incontinent Diet  AMBULATORY STATUS COMMUNICATION OF NEEDS Skin   Supervision Verbally Normal                       Personal Care Assistance Level of Assistance  Bathing, Dressing Bathing Assistance: Limited assistance   Dressing Assistance: Limited assistance     Functional Limitations Info             SPECIAL CARE FACTORS FREQUENCY                       Contractures Contractures Info: Not present    Additional Factors Info  Code Status Code Status Info: DNR             Current Medications (09/28/2019):  This is the current hospital active medication list Current Facility-Administered Medications  Medication Dose Route Frequency Provider Last Rate Last Dose  . 0.9 %  sodium chloride infusion  250 mL Intravenous PRN Haydee Monica, MD      . acetaminophen (TYLENOL) tablet 650 mg  650 mg Oral Q6H PRN Tarry Kos A, MD      . chlorpheniramine-HYDROcodone (TUSSIONEX) 10-8 MG/5ML suspension 5 mL  5 mL Oral Q12H PRN Haydee Monica, MD      . cholecalciferol (VITAMIN D) tablet 1,000 Units  1,000 Units Oral Daily Leroy Sea, MD   1,000 Units at 09/28/19 0840  . cyclobenzaprine (FLEXERIL) tablet 5 mg  5 mg  Oral TID PRN Thurnell Lose, MD   5 mg at 09/26/19 1024  . DULoxetine (CYMBALTA) DR capsule 120 mg  120 mg Oral Daily Thurnell Lose, MD   120 mg at 09/28/19 0841  . enoxaparin (LOVENOX) injection 40 mg  40 mg Subcutaneous Q24H Thurnell Lose, MD   40 mg at 09/28/19 0850  . gabapentin (NEURONTIN) capsule 200 mg  200 mg Oral TID Thurnell Lose, MD   200 mg at 09/28/19 0850  . guaiFENesin-dextromethorphan (ROBITUSSIN DM) 100-10 MG/5ML syrup 10 mL  10 mL Oral Q4H PRN Derrill Kay A, MD      . haloperidol lactate (HALDOL) injection 2 mg  2 mg Intramuscular Q6H PRN Phillips Grout, MD   2 mg at 09/26/19 0448  . insulin aspart (novoLOG) injection 0-15  Units  0-15 Units Subcutaneous TID WC Charlynne Cousins, MD   2 Units at 09/27/19 1700  . insulin aspart (novoLOG) injection 0-5 Units  0-5 Units Subcutaneous QHS Charlynne Cousins, MD      . insulin aspart (novoLOG) injection 3 Units  3 Units Subcutaneous TID WC Charlynne Cousins, MD   3 Units at 09/27/19 1659  . lubiprostone (AMITIZA) capsule 24 mcg  24 mcg Oral BID WC Thurnell Lose, MD   24 mcg at 09/28/19 0841  . ondansetron (ZOFRAN) tablet 4 mg  4 mg Oral Q6H PRN Phillips Grout, MD       Or  . ondansetron (ZOFRAN) injection 4 mg  4 mg Intravenous Q6H PRN Phillips Grout, MD      . pantoprazole (PROTONIX) EC tablet 40 mg  40 mg Oral Daily Thurnell Lose, MD   40 mg at 09/28/19 0854  . protein supplement (ENSURE MAX) liquid  11 oz Oral BID Thurnell Lose, MD   11 oz at 09/27/19 1119  . QUEtiapine (SEROQUEL XR) 24 hr tablet 200 mg  200 mg Oral Q1500 Thurnell Lose, MD   200 mg at 09/27/19 1405  . sodium chloride flush (NS) 0.9 % injection 3 mL  3 mL Intravenous Q12H Derrill Kay A, MD   3 mL at 09/28/19 0850  . sodium chloride flush (NS) 0.9 % injection 3 mL  3 mL Intravenous PRN Derrill Kay A, MD      . tamsulosin (FLOMAX) capsule 0.4 mg  0.4 mg Oral Daily Thurnell Lose, MD   0.4 mg at 09/28/19 0850  . traZODone (DESYREL) tablet 50 mg  50 mg Oral QHS Thurnell Lose, MD   50 mg at 09/27/19 2037  . vitamin C (ASCORBIC ACID) tablet 500 mg  500 mg Oral Daily Derrill Kay A, MD   500 mg at 09/28/19 0840  . zinc sulfate capsule 220 mg  220 mg Oral Daily Phillips Grout, MD   220 mg at 09/28/19 0840     Discharge Medications: Please see discharge summary for a list of discharge medications.  Relevant Imaging Results:  Relevant Lab Results:   Additional Information    Joaquin Courts, RN

## 2019-09-28 NOTE — TOC Progression Note (Signed)
Transition of Care [facility]) - Progression Note    Patient Details  Name: Aaron Foster MRN: [id number] Date of Birth: 1946/08/13  [facility] CM/SW Contact  Joaquin Courts, RN Phone Number: 09/28/2019, 3:21 PM  Clinical Narrative:    Patient will discharge back to group home today. CM spoke with group home RN Gena Fray [phone]. CM faxed over patient's dc summary and FL2 per request. Group home will provide transportation for patient at discharge. Bedside RN please call Gena Fray at [phone] with report and she will schedule a pick up time for patient.     Expected Discharge Plan: Group Home Barriers to Discharge: No Barriers Identified  Expected Discharge Plan and Services Expected Discharge Plan: [place] arrangements for the past 2 months: Group Home Expected Discharge Date: 09/27/19               DME Arranged: N/A                     Social Determinants of Health (SDOH) Interventions    Readmission Risk Interventions No flowsheet data found.

## 2019-09-28 NOTE — Care Management Important Message (Signed)
Important Message  Patient Details  Name: Aaron Foster MRN: [id number] Date of Birth: 1946/06/24   Medicare Important Message Given:  Yes - Important Message mailed due to current National Emergency  Verbal consent obtained due to current National Emergency  Relationship to patient: Brother/Sister Contact Name: Milus Mallick Call Date: 09/28/19  Time: 1507 Phone: [phone] Outcome: Spoke with contact Important Message mailed to: Other (must enter comment)(emailed to [email or web address]    Daryll Spisak P Jaslene Marsteller 09/28/2019, 3:08 PM

## 2019-09-28 NOTE — Progress Notes (Signed)
   09/28/19 1811  Family/Significant Other Communication  Family/Significant Other Update Called;Updated (Spoke with sister Aaron Foster)

## 2019-09-28 NOTE — Plan of Care (Signed)

## 2019-09-28 NOTE — Progress Notes (Signed)
[facility] PROGRESS NOTE    Progress Note  Aaron RoverJoseph J Behlke  NWG:[id number] DOB: 1946-03-28 DOA: 09/21/2019 PCP: Alba CorySowles, Krichna, MD   Brief Narrative:   Aaron Foster is an 73 y.o. male past medical history significant for mental retardation nonverbal depression anxiety diagnosed with COVID-19 4 days prior to admission comes in with progressive shortness of breath and fever.  Cannot provide history since most of the history was obtained from the chart.  Given several doses of Ativan, CT scan suspected for lung mass or also pneumonia.  Assessment/Plan:   Acute respiratory failure with hypoxia due to Pneumonia due to COVID-19 virus [name] currently satting greater than 93% on room air. [name] complete his course of IV remdesivir and steroids. We are awaiting placement to skilled nursing facility. No changes overnight.  Incidental lung mass: CT scan showed a 17 x 11 mm subpleural mass in the posterior left lung apex concerning for neoplasm. Unlikely a candidate for extensive work-up will need further work-up as an outpatient.  Acute confusional state/mental retardation: Continues to be calm requiring no medications. Continue sitter at bedside.  Ethics: The previous physician had an extensive discussion with his sister on 09/22/2019 in the morning, she relates that he is a DNR, she desires no feeding tube or extreme measures. She wanted continue gentle medical treatment and if the patient declines can move towards comfort care.  DVT prophylaxis: lovenox Family Communication:none Disposition Plan/Barrier to D/C: Back to facility on 09/28/2019 Code Status:     Code Status Orders  (From admission, onward)         Start     Ordered   09/21/19 2121  Do not attempt resuscitation (DNR)  Continuous    Question Answer Comment  In the event of cardiac or respiratory ARREST Do not call a "code blue"   In the event of cardiac or respiratory ARREST Do not perform Intubation,  CPR, defibrillation or ACLS   In the event of cardiac or respiratory ARREST Use medication by any route, position, wound care, and other measures to relive pain and suffering. May use oxygen, suction and manual treatment of airway obstruction as needed for comfort.      09/21/19 2122        Code Status History    Date Active Date Inactive Code Status Order ID Comments User Context   09/21/2019 1051 09/21/2019 2051 DNR [id number]  Emily FilbertWilliams, Jonathan E, MD ED   Advance Care Planning Activity        IV Access:    Peripheral IV   Procedures and diagnostic studies:   No results found.   Medical Consultants:    None.  Anti-Infectives:   IV remdesivir.  Subjective:    Nicki GuadalajaraJoseph J Hartland nonverbal.  Objective:    Vitals:   09/27/19 1152 09/27/19 1752 09/27/19 1931 09/28/19 0358  BP: (!) 112/59 (!) 97/51  (!) 115/59  Pulse:   75 88  Resp: 18 18 16 16   Temp:  97.6 F (36.4 C) 97.8 F (36.6 C) 97.6 F (36.4 C)  TempSrc:  Axillary Axillary Axillary  SpO2:  92% 93% 94%  Weight:       SpO2: 94 % O2 Flow Rate (L/min): 1 L/min   Intake/Output Summary (Last 24 hours) at 09/28/2019 0825 Last data filed at 09/27/2019 1931 Gross per 24 hour  Intake 120 ml  Output 300 ml  Net -180 ml   Filed Weights   09/22/19 0424  Weight: 52.6 kg  Exam: General exam: In no acute distress. Respiratory system: Good air movement and diffuse crackles. Cardiovascular system: S1 & S2 heard, RRR. No JVD.  Gastrointestinal system: Abdomen is nondistended, soft and nontender.  Extremities: No pedal edema. Skin: No rashes, lesions or ulcers  Data Reviewed:    Labs: Basic Metabolic Panel: Recent Labs  Lab 09/22/19 1010 09/23/19 0624 09/24/19 0250 09/25/19 0202 09/26/19 0200  NA 144 139 138 140 138  K 4.4 4.5 4.6 4.3 5.1  CL 105 100 100 103 101  CO2 28 28 25 28 26   GLUCOSE 127* 175* 131* 142* 119*  BUN 32* 28* 32* 32* 35*  CREATININE 0.91 0.81 0.80 0.80 0.80   CALCIUM 8.5* 8.7* 8.5* 8.8* 9.4  MG 2.2 2.0 2.2 2.0 1.8   GFR Estimated Creatinine Clearance: 62.1 mL/min (by C-G formula based on SCr of 0.8 mg/dL). Liver Function Tests: Recent Labs  Lab 09/22/19 1010 09/23/19 0624 09/24/19 0250 09/25/19 0202 09/26/19 0200  AST 90* 63* 43* 44* 31  ALT 38 34 32 36 32  ALKPHOS 52 52 59 61 67  BILITOT 1.0 0.7 1.1 0.6 0.8  PROT 7.3 6.6 7.0 6.5 6.6  ALBUMIN 3.7 3.3* 3.5 3.1* 3.4*   No results for input(s): LIPASE, AMYLASE in the last 168 hours. No results for input(s): AMMONIA in the last 168 hours. Coagulation profile No results for input(s): INR, PROTIME in the last 168 hours. COVID-19 Labs  Recent Labs    09/26/19 0200 09/27/19 0340 09/28/19 0353  DDIMER 1.69* 1.19* 1.71*  CRP 6.9* 12.4* 8.6*    Lab Results  Component Value Date   SARSCOV2NAA POSITIVE (A) 09/21/2019    CBC: Recent Labs  Lab 09/22/19 1010 09/23/19 0624 09/24/19 0250 09/25/19 0202 09/26/19 0200  WBC 5.0 7.0 4.3 7.6 7.5  NEUTROABS 3.9 6.1 3.5 6.2 6.2  HGB 12.3* 12.8* 12.6* 12.0* 12.2*  HCT 37.8* 39.3 39.1 37.4* 38.2*  MCV 95.7 95.9 95.6 96.4 96.7  PLT 251 298 329 339 403*   Cardiac Enzymes: No results for input(s): CKTOTAL, CKMB, CKMBINDEX, TROPONINI in the last 168 hours. BNP (last 3 results) No results for input(s): PROBNP in the last 8760 hours. CBG: Recent Labs  Lab 09/26/19 2354 09/27/19 0411 09/27/19 0727 09/27/19 1157 09/27/19 1649  GLUCAP 134* 129* 152* 128* 135*   D-Dimer: Recent Labs    09/27/19 0340 09/28/19 0353  DDIMER 1.19* 1.71*   Hgb A1c: No results for input(s): HGBA1C in the last 72 hours. Lipid Profile: No results for input(s): CHOL, HDL, LDLCALC, TRIG, CHOLHDL, LDLDIRECT in the last 72 hours. Thyroid function studies: No results for input(s): TSH, T4TOTAL, T3FREE, THYROIDAB in the last 72 hours.  Invalid input(s): FREET3 Anemia work up: No results for input(s): VITAMINB12, FOLATE, FERRITIN, TIBC, IRON, RETICCTPCT  in the last 72 hours. Sepsis Labs: Recent Labs  Lab 09/21/19 1030  09/23/19 0624 09/24/19 0250 09/25/19 0202 09/26/19 0200  WBC  --    < > 7.0 4.3 7.6 7.5  LATICACIDVEN 0.8  --   --   --   --   --    < > = values in this interval not displayed.   Microbiology Recent Results (from the past 240 hour(s))  Blood Culture (routine x 2)     Status: None   Collection Time: 09/21/19  7:35 AM   Specimen: BLOOD  Result Value Ref Range Status   Specimen Description BLOOD RIGHT FA  Final   Special Requests   Final  BOTTLES DRAWN AEROBIC AND ANAEROBIC Blood Culture adequate volume   Culture   Final    NO GROWTH 5 DAYS Performed at [facility], [address] [place], [place] [address]    Report Status 09/26/2019 FINAL  Final  Blood Culture (routine x 2)     Status: None   Collection Time: 09/21/19  7:35 AM   Specimen: BLOOD  Result Value Ref Range Status   Specimen Description BLOOD RIGHT FA  Final   Special Requests   Final    BOTTLES DRAWN AEROBIC AND ANAEROBIC Blood Culture results may not be optimal due to an excessive volume of blood received in culture bottles   Culture   Final    NO GROWTH 5 DAYS Performed at [facility], [address], [place], [place] [address]    Report Status 09/26/2019 FINAL  Final  SARS Coronavirus 2 by RT PCR (hospital order, performed in [facility] hospital lab) Nasopharyngeal Nasopharyngeal Swab     Status: Abnormal   Collection Time: 09/21/19  7:35 AM   Specimen: Nasopharyngeal Swab  Result Value Ref Range Status   SARS Coronavirus 2 POSITIVE (A) NEGATIVE Final    Comment: RESULT CALLED TO, READ BACK BY AND VERIFIED WITH:  Theophilus Bones AT 0849 09/21/2019 SDR (NOTE) If result is NEGATIVE SARS-CoV-2 target nucleic acids are NOT DETECTED. The SARS-CoV-2 RNA is generally detectable in upper and lower  respiratory specimens during the acute phase of infection. The lowest  concentration of SARS-CoV-2 viral copies this assay can  detect is 250  copies / mL. A negative result does not preclude SARS-CoV-2 infection  and should not be used as the sole basis for treatment or other  patient management decisions.  A negative result may occur with  improper specimen collection / handling, submission of specimen other  than nasopharyngeal swab, presence of viral mutation(s) within the  areas targeted by this assay, and inadequate number of viral copies  (<250 copies / mL). A negative result must be combined with clinical  observations, patient history, and epidemiological information. If result is POSITIVE SARS-CoV-2 target nucleic acids are DETECTED. The  SARS-CoV-2 RNA is generally detectable in upper and lower  respiratory specimens during the acute phase of infection.  Positive  results are indicative of active infection with SARS-CoV-2.  Clinical  correlation with patient history and other diagnostic information is  necessary to determine patient infection status.  Positive results do  not rule out bacterial infection or co-infection with other viruses. If result is PRESUMPTIVE POSTIVE SARS-CoV-2 nucleic acids MAY BE PRESENT.   A presumptive positive result was obtained on the submitted specimen  and confirmed on repeat testing.  While 2019 novel coronavirus  (SARS-CoV-2) nucleic acids may be present in the submitted sample  additional confirmatory testing may be necessary for epidemiological  and / or clinical management purposes  to differentiate between  SARS-CoV-2 and other Sarbecovirus currently known to infect humans.  If clinically indicated additional testing with an alternate test  methodology [phone]) is a dvised. The SARS-CoV-2 RNA is generally  detectable in upper and lower respiratory specimens during the acute  phase of infection. The expected result is Negative. Fact Sheet for Patients:  BoilerBrush.com.cy Fact Sheet for Healthcare Providers:  https://pope.com/ This test is not yet approved or cleared by the [place] FDA and has been authorized for detection and/or diagnosis of SARS-CoV-2 by FDA under an Emergency Use Authorization (EUA).  This EUA will remain in effect (meaning this  test can be used) for the duration of the COVID-19 declaration under Section 564(b)(1) of the Act, 21 U.S.C. section 360bbb-3(b)(1), unless the authorization is terminated or revoked sooner. Performed at [facility], [address] [place], [place] [address]      Medications:   . cholecalciferol  1,000 Units Oral Daily  . dexamethasone  6 mg Oral Daily  . DULoxetine  120 mg Oral Daily  . enoxaparin (LOVENOX) injection  40 mg Subcutaneous Q24H  . gabapentin  200 mg Oral TID  . insulin aspart  0-15 Units Subcutaneous TID WC  . insulin aspart  0-5 Units Subcutaneous QHS  . insulin aspart  3 Units Subcutaneous TID WC  . lubiprostone  24 mcg Oral BID WC  . pantoprazole  40 mg Oral Daily  . Ensure Max Protein  11 oz Oral BID  . QUEtiapine  200 mg Oral Q1500  . sodium chloride flush  3 mL Intravenous Q12H  . tamsulosin  0.4 mg Oral Daily  . traZODone  50 mg Oral QHS  . vitamin C  500 mg Oral Daily  . zinc sulfate  220 mg Oral Daily   Continuous Infusions: . sodium chloride       LOS: 7 days   Marinda Elk  [facility]  09/28/2019, 8:25 AM

## 2019-09-28 NOTE — Progress Notes (Addendum)
Shift summary: Patient combative at times. Given PRN for agitation, patient rested after dose. Report attempted X3 to facility. Facility returned call for report. Spoke with Jodie Echevaria to pick up patient. Belongings give to driver [place].  Patient had yellow MEWS and low BP today. MD made aware.

## 2019-09-29 ENCOUNTER — Telehealth: Payer: Self-pay

## 2019-09-29 NOTE — Telephone Encounter (Signed)
Attempted to reach patient's caregiver, Aaron Foster for TCM call and confirm hospital follow up appt. Left msg for her to reach me directly at [phone] or contact the office.

## 2019-10-06 ENCOUNTER — Telehealth: Payer: Self-pay | Admitting: Family Medicine

## 2019-10-06 NOTE — Telephone Encounter (Signed)
Spoke with the on call Administrator with [facility] to get more information on the death of Mr. Hannis.  The Administrator informed me that Mr. Abascal had tested positive for COVID 19 on 09/21/2019 and was transported from [facility] to [facility] on the same day.  Patient was discharged back to the group on 09/28/2019 with instructions to stay isolated and monitored.  The Administrator stated that when Mr. Snelson returned to the group home they keep him isolated and they continued to monitor him.  He stated that patient stilled seemed weak, stayed in bed because he didn't have enough strength to stand or walk with his walker once he came back to the group home on 09/28/2019.  The morning of patient's death one of the workers had got Mr. Chavira up and sat him up in his chair, feed him, and gave him medication.  He had no temp and his oxygen was stable at the time.  The worker left the room to check on another client and came back and found that Mr. Zeiter was not breathing so EMS was called.  EMS performed CPR on patient but he was unresponsive.  Time of death was 9:15 AM on 10-17-19.

## 2019-10-06 DEATH — deceased

## 2019-10-13 ENCOUNTER — Telehealth: Payer: Medicare Other | Admitting: Family Medicine

## 2020-04-12 ENCOUNTER — Ambulatory Visit: Payer: Medicare Other

## 2020-08-09 ENCOUNTER — Other Ambulatory Visit: Payer: Medicare Other

## 2020-08-11 ENCOUNTER — Ambulatory Visit: Payer: Medicare Other | Admitting: Urology

## 2021-04-18 IMAGING — CT CT ANGIO CHEST
2 of 6 series · 19 of 46 positions shown · IV contrast (APPLIED)
Comparison: None.

CLINICAL DATA: Shortness of breath.

EXAM:
CT ANGIOGRAPHY CHEST WITH CONTRAST
TECHNIQUE: Multidetector CT imaging of the chest was performed using the
standard protocol during bolus administration of intravenous
contrast. Multiplanar CT image reconstructions and MIPs were
obtained to evaluate the vascular anatomy.
CONTRAST:  75mL OMNIPAQUE IOHEXOL 350 MG/ML SOLN

[Series 5: thins · axial · 0.58mm/px · z∈[-324,-43]mm · 16 of 309 slices shown]
[im 14/309  lung]
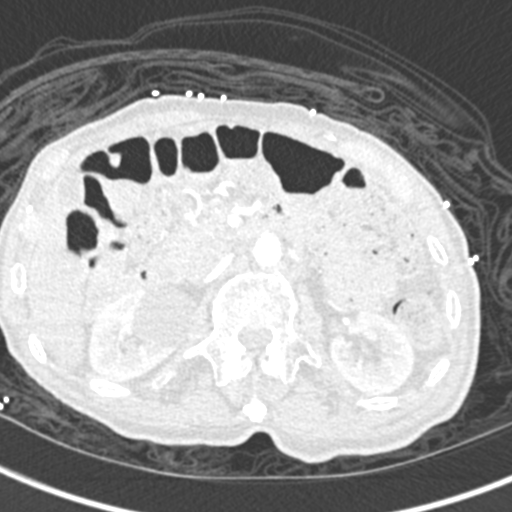
[im 41/309  soft-tissue]
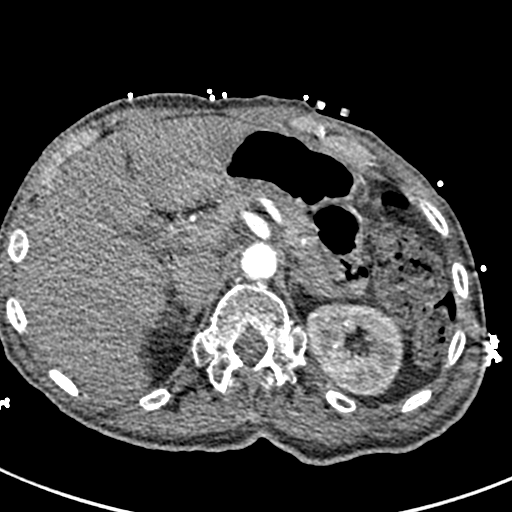
[im 54/309  lung]
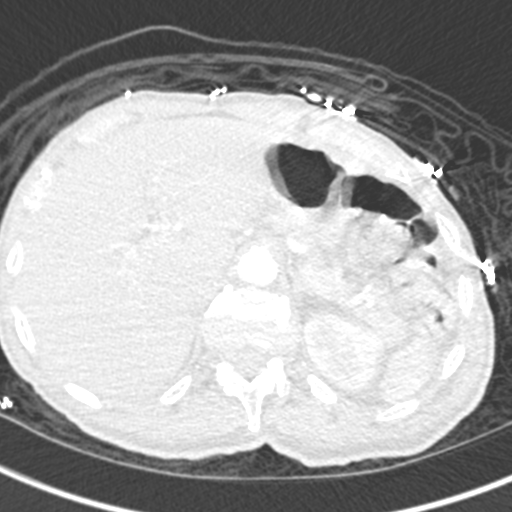
[im 67/309  soft-tissue]
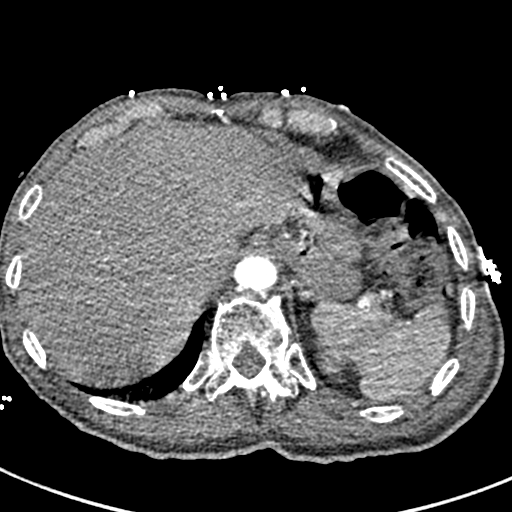
[im 94/309  lung]
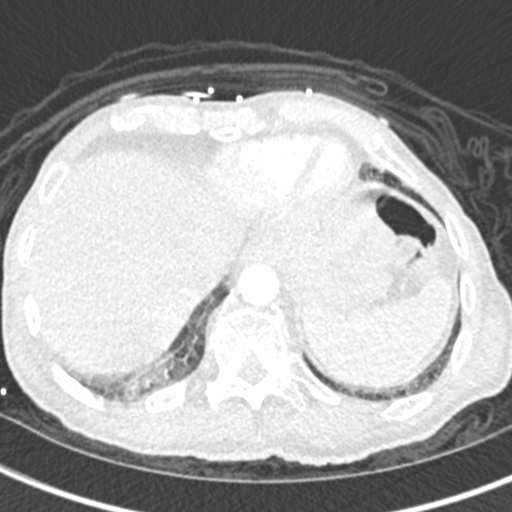
[im 108/309  soft-tissue]
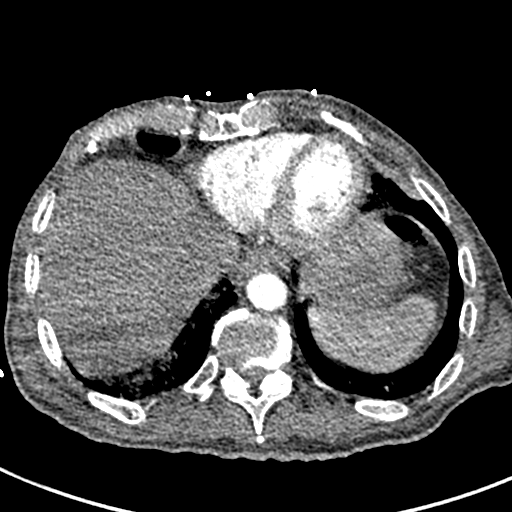
[im 121/309  lung]
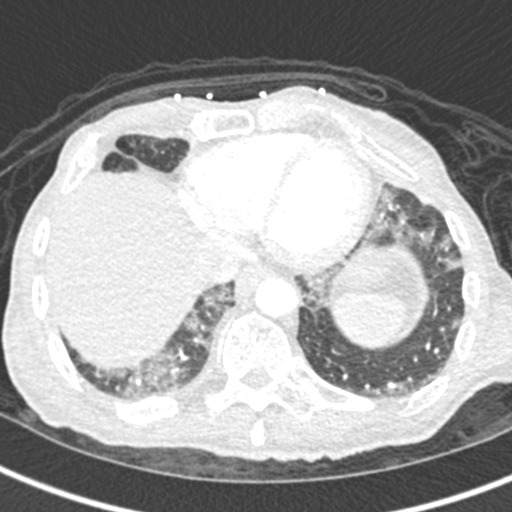
[im 148/309  soft-tissue]
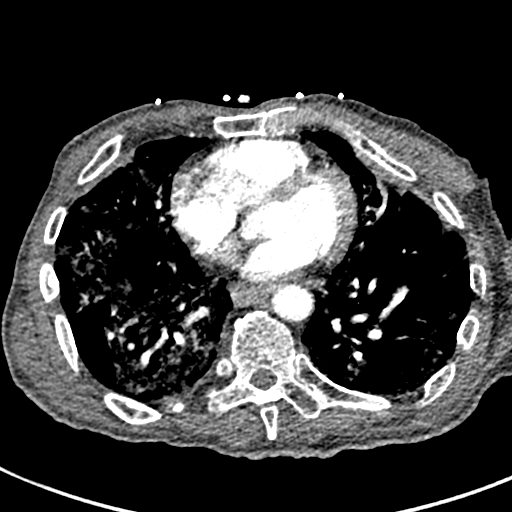
[im 161/309  lung]
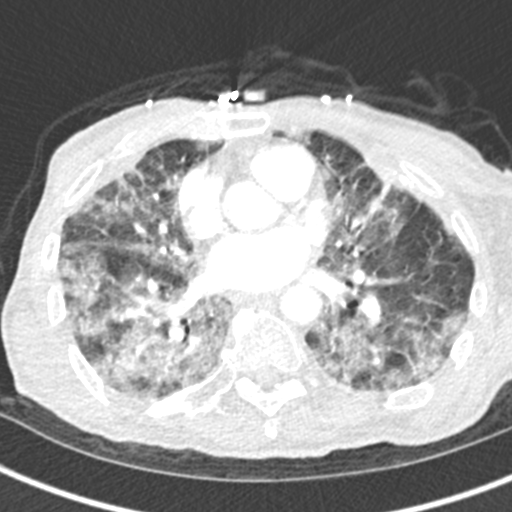
[im 188/309  soft-tissue]
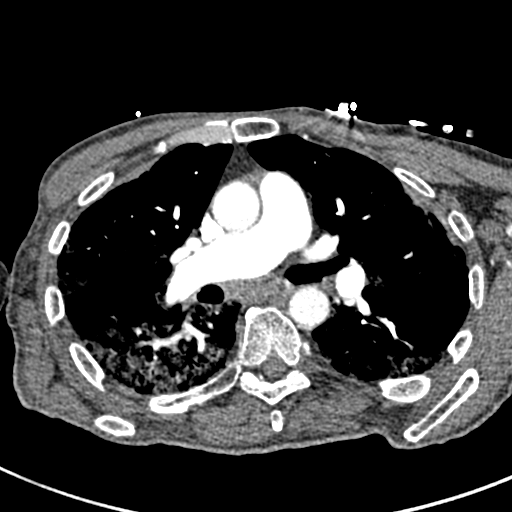
[im 201/309  lung]
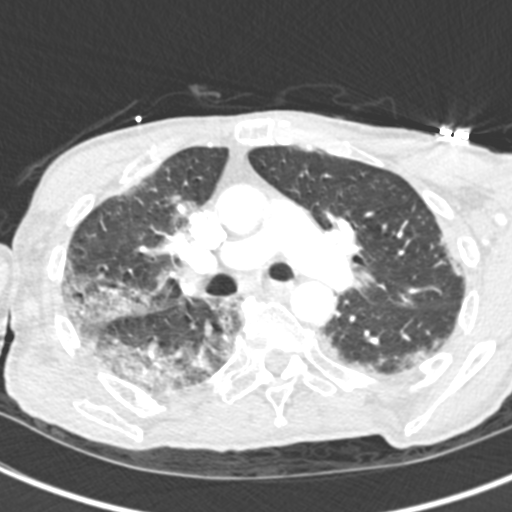
[im 215/309  soft-tissue]
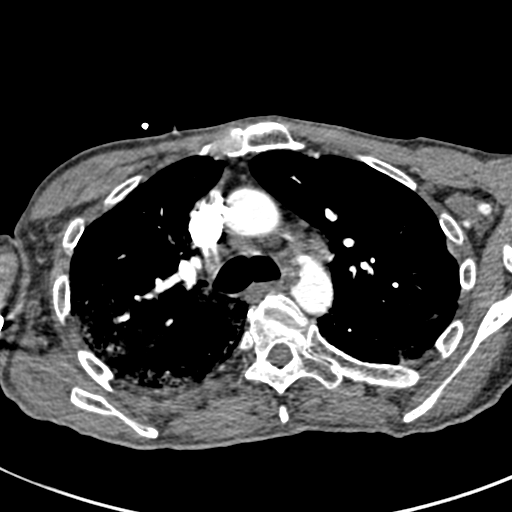
[im 242/309  lung]
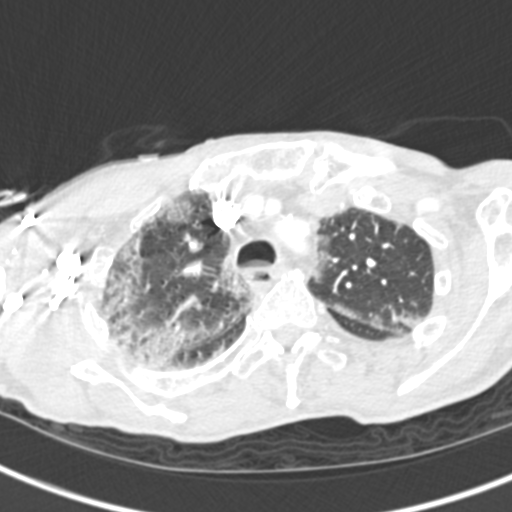
[im 255/309  soft-tissue]
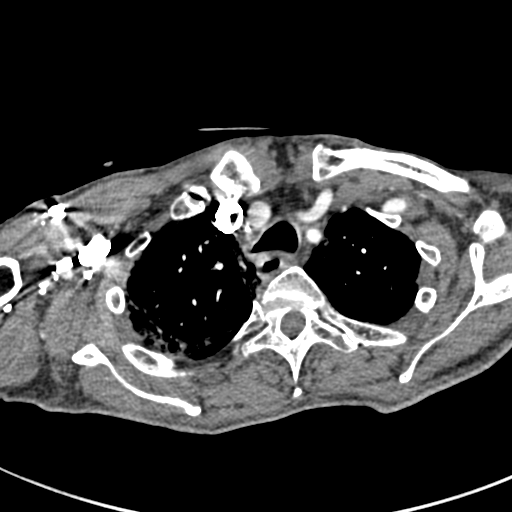
[im 268/309  lung]
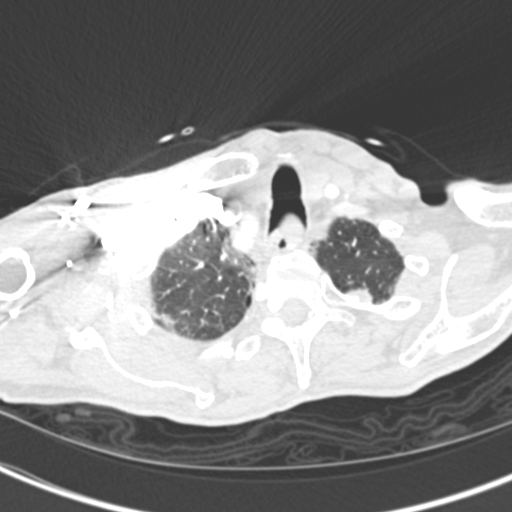
[im 295/309  soft-tissue]
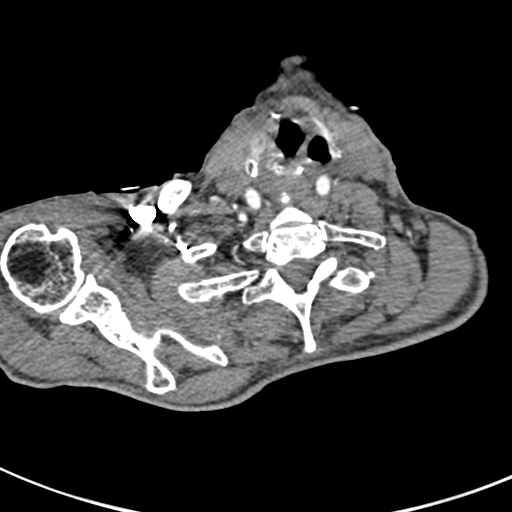

[Series 7: coronal mpr · coronal · 0.60mm/px · 3 of 77 slices shown]
[im 20/77  soft-tissue]
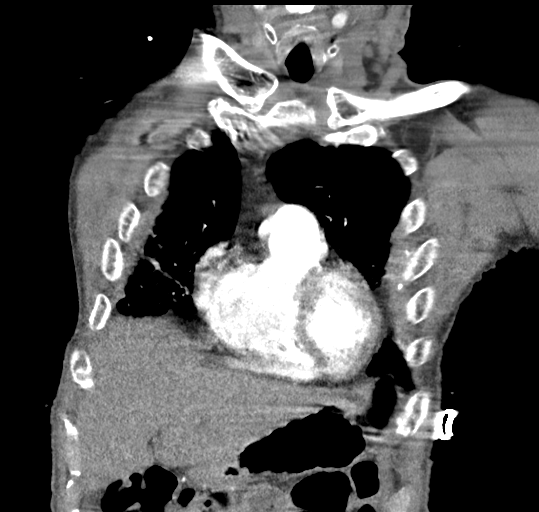
[im 39/77  soft-tissue]
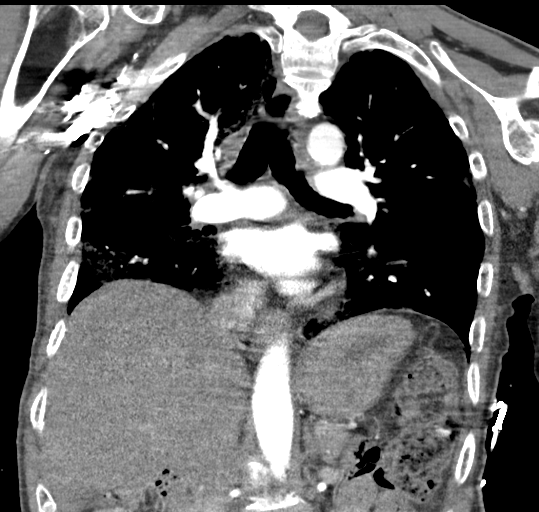
[im 58/77  soft-tissue]
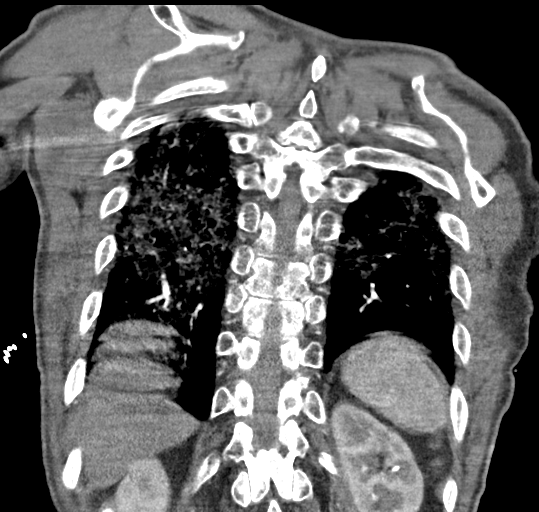

[19 of 46 positions shown; findings below may reference images not displayed]

FINDINGS: Cardiovascular: Satisfactory opacification of the pulmonary arteries
to the segmental level. No evidence of pulmonary embolism. Normal
heart size. No pericardial effusion.

Mediastinum/Nodes: No enlarged mediastinal, hilar, or axillary lymph
nodes. Thyroid gland, trachea, and esophagus demonstrate no
significant findings.

Lungs/Pleura: No pneumothorax or pleural effusion is noted. Multiple
airspace opacities are noted in both lungs, right greater than left.
This is concerning for multifocal pneumonia. 17 x 11 mm subpleural
mass is noted posteriorly in the left lung apex concerning for
possible neoplasm.

Upper Abdomen: No acute abnormality.

Musculoskeletal: No chest wall abnormality. No acute or significant
osseous findings.

Review of the MIP images confirms the above findings.
IMPRESSION: 17 x 11 mm subpleural mass is noted posteriorly in the left lung
apex concerning for possible neoplasm; PET scan is recommended for
further evaluation.

No definite evidence of pulmonary embolus.

Multiple airspace opacities are noted bilaterally, right greater
than left, consistent with multifocal pneumonia.

Aortic Atherosclerosis ([id number]).

## 2023-12-16 ENCOUNTER — Encounter (HOSPITAL_COMMUNITY): Payer: Self-pay
